# Patient Record
Sex: Female | Born: 1970 | Race: Black or African American | Hispanic: No | Smoking: Never smoker
Health system: Southern US, Community
[De-identification: ages and names within clinical notes are randomized; demographics above are authoritative.]

## PROBLEM LIST (undated history)

## (undated) DIAGNOSIS — J45909 Unspecified asthma, uncomplicated: Secondary | ICD-10-CM

## (undated) DIAGNOSIS — I509 Heart failure, unspecified: Secondary | ICD-10-CM

## (undated) DIAGNOSIS — K219 Gastro-esophageal reflux disease without esophagitis: Secondary | ICD-10-CM

## (undated) DIAGNOSIS — I10 Essential (primary) hypertension: Secondary | ICD-10-CM

## (undated) DIAGNOSIS — M1990 Unspecified osteoarthritis, unspecified site: Secondary | ICD-10-CM

## (undated) DIAGNOSIS — F32A Depression, unspecified: Secondary | ICD-10-CM

## (undated) DIAGNOSIS — G4733 Obstructive sleep apnea (adult) (pediatric): Secondary | ICD-10-CM

## (undated) DIAGNOSIS — K5792 Diverticulitis of intestine, part unspecified, without perforation or abscess without bleeding: Secondary | ICD-10-CM

## (undated) DIAGNOSIS — F329 Major depressive disorder, single episode, unspecified: Secondary | ICD-10-CM

## (undated) DIAGNOSIS — J40 Bronchitis, not specified as acute or chronic: Secondary | ICD-10-CM

## (undated) HISTORY — PX: CHOLECYSTECTOMY: SHX55

## (undated) HISTORY — PX: ABDOMINAL HYSTERECTOMY: SHX81

## (undated) HISTORY — PX: GASTROPLASTY: SHX192

---

## 2014-09-25 ENCOUNTER — Encounter (HOSPITAL_COMMUNITY): Payer: Self-pay | Admitting: Emergency Medicine

## 2014-09-25 ENCOUNTER — Emergency Department (HOSPITAL_COMMUNITY)

## 2014-09-25 ENCOUNTER — Emergency Department (HOSPITAL_COMMUNITY)
Admission: EM | Admit: 2014-09-25 | Discharge: 2014-09-25 | Disposition: A | Discharge status: Home / Self Care | Attending: Emergency Medicine | Admitting: Emergency Medicine

## 2014-09-25 DIAGNOSIS — S63601A Unspecified sprain of right thumb, initial encounter: Secondary | ICD-10-CM | POA: Diagnosis not present

## 2014-09-25 DIAGNOSIS — I10 Essential (primary) hypertension: Secondary | ICD-10-CM | POA: Insufficient documentation

## 2014-09-25 DIAGNOSIS — M1990 Unspecified osteoarthritis, unspecified site: Secondary | ICD-10-CM | POA: Insufficient documentation

## 2014-09-25 DIAGNOSIS — Y9389 Activity, other specified: Secondary | ICD-10-CM | POA: Diagnosis not present

## 2014-09-25 DIAGNOSIS — W1830XA Fall on same level, unspecified, initial encounter: Secondary | ICD-10-CM | POA: Diagnosis not present

## 2014-09-25 DIAGNOSIS — Y929 Unspecified place or not applicable: Secondary | ICD-10-CM | POA: Diagnosis not present

## 2014-09-25 DIAGNOSIS — Z79899 Other long term (current) drug therapy: Secondary | ICD-10-CM | POA: Insufficient documentation

## 2014-09-25 DIAGNOSIS — S6990XA Unspecified injury of unspecified wrist, hand and finger(s), initial encounter: Secondary | ICD-10-CM | POA: Diagnosis present

## 2014-09-25 HISTORY — DX: Essential (primary) hypertension: I10

## 2014-09-25 HISTORY — DX: Unspecified osteoarthritis, unspecified site: M19.90

## 2014-09-25 MED ORDER — IBUPROFEN 800 MG PO TABS
800.0000 mg | ORAL_TABLET | Freq: Once | ORAL | Status: AC
  Administered 2014-09-25: 800 mg via ORAL
  Filled 2014-09-25: qty 1

## 2014-09-25 MED ORDER — HYDROCODONE-ACETAMINOPHEN 5-325 MG PO TABS: 1.0000 | ORAL_TABLET | ORAL | Status: DC | PRN

## 2014-09-25 MED ORDER — IBUPROFEN 600 MG PO TABS: 600.0000 mg | ORAL_TABLET | Freq: Four times a day (QID) | ORAL | Status: DC | PRN

## 2014-09-25 NOTE — Discharge Instructions (Signed)
Thumb Sprain Your exam shows you have a sprained thumb. This means the ligaments around the joint have been torn. Thumb sprains usually take 3-6 weeks to heal. However, severe, unstable sprains may need to be fixed surgically. Sometimes a small piece of bone is pulled off by the ligament. If this is not treated properly, a sprained thumb can lead to a painful, weak joint. Treatment helps reduce pain and shortens the period of disability. The thumb, and often the wrist, must remain splinted for the first 2-4 weeks to protect the joint. Keep your hand elevated and apply ice packs frequently to the injured area (20-30 minutes every 2-3 hours) for the next 2-4 days. This helps reduce swelling and control pain. Pain medicine may also be used for several days. Motion and strengthening exercises may later be prescribed for the joint to return to normal function. Be sure to see your doctor for follow-up because your thumb joint may require further support with splints, bandages or tape. Please see your doctor or go to the emergency room right away if you have increased pain despite proper treatment, or a numb, cold, or pale thumb. Document Released: 01/13/2005 Document Revised: 02/28/2012 Document Reviewed: 12/07/2008 [facility] Patient Information 2015 [place]. This information is not intended to replace advice given to you by your health care provider. Make sure you discuss any questions you have with your health care provider.   Your x-rays are negative for any broken bones, I suspect you have sprained it your thumb as discussed.  Wear the splint for comfort.  Ice and elevation will also help with pain swelling.  Use the medications prescribed.  Do not drive within 4 hours of taking hydrocodone as this will make you drowsy.  Call Dr. Romeo AppleHarrison for further evaluation of your injury, especially if it is not improving with this treatment.  Please note that your blood pressure is elevated this evening.  Make  sure you take your blood pressure medicines when you arrive home.

## 2014-09-25 NOTE — ED Notes (Signed)
Pt fell on Sunday, Right hand pain, swelling, has been trying to deal with it, states it is not getting better.

## 2014-09-25 NOTE — ED Provider Notes (Signed)
CSN: [id number]     Arrival date & time 09/25/14  1827 History   First MD Initiated Contact with Patient 09/25/14 2022     Chief Complaint  Patient presents with  . Hand Injury     (Consider location/radiation/quality/duration/timing/severity/associated sxs/prior Treatment) The history is provided by the patient.   Ann Giles is a 43 y.o. right handed female presenting with persistent pain in her right hand, specifically her right proximal palm since slipping on a wet step 4 days ago, falling with her right hand stretched out behind her, with her from taking the majority of the strain with her fall.  She has persistent pain which radiates into her dorsal forearm.  Her pain is better at rest and worsened with movement and when the extremity is recumbent.  She has taken no medications, no treatment for this injury.     Past Medical History  Diagnosis Date  . Hypertension   . Arthritis    Past Surgical History  Procedure Laterality Date  . Cholecystectomy    . Abdominal hysterectomy     No family history on file. History  Substance Use Topics  . Smoking status: Never Smoker   . Smokeless tobacco: Not on file  . Alcohol Use: No   OB History   Grav Para Term Preterm Abortions TAB SAB Ect Mult Living                 Review of Systems  Constitutional: Negative for fever.  Musculoskeletal: Positive for arthralgias and joint swelling. Negative for myalgias.  Neurological: Negative for weakness and numbness.      Allergies  Review of patient's allergies indicates no known allergies.  Home Medications   Prior to Admission medications   Medication Sig Start Date End Date Taking? Authorizing Provider  diphenhydrAMINE (BENADRYL) 25 MG tablet Take 25-50 mg by mouth at bedtime as needed for allergies or sleep.   Yes Historical Provider, MD  hydrochlorothiazide (HYDRODIURIL) 25 MG tablet Take 25 mg by mouth at bedtime.   Yes Historical Provider, MD  omeprazole  (PRILOSEC) 20 MG capsule Take 20 mg by mouth at bedtime.   Yes Historical Provider, MD  sertraline (ZOLOFT) 25 MG tablet Take 25 mg by mouth at bedtime.   Yes Historical Provider, MD  HYDROcodone-acetaminophen (NORCO/VICODIN) 5-325 MG per tablet Take 1 tablet by mouth every 4 (four) hours as needed. 09/25/14   Burgess Amor, PA-C  ibuprofen (ADVIL,MOTRIN) 600 MG tablet Take 1 tablet (600 mg total) by mouth every 6 (six) hours as needed. 09/25/14   Burgess Amor, PA-C   BP 187/92  Pulse 58  Temp(Src) 98.1 F (36.7 C) (Oral)  Resp 20  Ht 5\' 8"  (1.727 m)  Wt 358 lb 12.8 oz (162.751 kg)  BMI 54.57 kg/m2  SpO2 95% Physical Exam  Constitutional: She appears well-developed and well-nourished.  HENT:  Head: Atraumatic.  Neck: Normal range of motion.  Cardiovascular:  Pulses equal bilaterally  Musculoskeletal: She exhibits tenderness. She exhibits no edema.  Patient is tender to palpation at her right thumb MCP joint.  She is able to flex and extend the thumb but with discomfort.  Resisted flex extension is also uncomfortable.  She has no palpable deformity, there is no crepitus and no pain to help patient over her scaphoid.  Forearm is nontender.  There is no appreciable edema.  Distal Refill is less than 2 seconds.  Full radial pulse present.  Neurological: She is alert. She has normal strength. She displays  normal reflexes. No sensory deficit.  Skin: Skin is warm and dry.  Psychiatric: She has a normal mood and affect.    ED Course  Procedures (including critical care time) Labs Review Labs Reviewed - No data to display  Imaging Review Dg Hand Complete Right  09/25/2014   CLINICAL DATA:  Status post fall on outstretched hand with 09/22/2014 with pain since the accident. Initial encounter.  EXAM: RIGHT HAND - COMPLETE 3+ VIEW  COMPARISON:  None.  FINDINGS: Imaged bones, joints and soft tissues appear normal.  IMPRESSION: Negative exam.   Electronically Signed   By: Drusilla Kannerhomas  Dalessio M.D.   On:  09/25/2014 19:15     EKG Interpretation None      MDM   Final diagnoses:  Sprain, thumb, right, initial encounter  Essential hypertension    Thumb spica splint applied, patient was prescribed ibuprofen and hydrocodone.  Encouraged ice, elevation.  Referral to Dr. Romeo AppleHarrison for further evaluation and treatment as needed.  Blood pressure is elevated at today's visit.  She was encouraged to take her blood pressure medications upon arrival home.  Patients labs and/or radiological studies were viewed and considered during the medical decision making and disposition process.     Burgess AmorJulie Elmira Olkowski, PA-C 09/25/14 2127

## 2014-09-25 NOTE — ED Provider Notes (Signed)
Medical screening examination/treatment/procedure(s) were performed by non-physician practitioner and as supervising physician I was immediately available for consultation/collaboration.   EKG Interpretation None        Benny LennertJoseph L Aviannah Castoro, MD 09/25/14 2317

## 2016-05-18 ENCOUNTER — Emergency Department (HOSPITAL_COMMUNITY)
Admission: EM | Admit: 2016-05-18 | Discharge: 2016-05-18 | Disposition: A | Discharge status: Home / Self Care | Payer: TRICARE For Life (TFL) | Attending: Emergency Medicine | Admitting: Emergency Medicine

## 2016-05-18 ENCOUNTER — Emergency Department (HOSPITAL_COMMUNITY): Payer: TRICARE For Life (TFL)

## 2016-05-18 ENCOUNTER — Encounter (HOSPITAL_COMMUNITY): Payer: Self-pay | Admitting: Emergency Medicine

## 2016-05-18 DIAGNOSIS — M1990 Unspecified osteoarthritis, unspecified site: Secondary | ICD-10-CM | POA: Diagnosis not present

## 2016-05-18 DIAGNOSIS — I10 Essential (primary) hypertension: Secondary | ICD-10-CM | POA: Diagnosis not present

## 2016-05-18 DIAGNOSIS — F329 Major depressive disorder, single episode, unspecified: Secondary | ICD-10-CM | POA: Diagnosis not present

## 2016-05-18 DIAGNOSIS — N200 Calculus of kidney: Secondary | ICD-10-CM | POA: Diagnosis not present

## 2016-05-18 DIAGNOSIS — Z791 Long term (current) use of non-steroidal anti-inflammatories (NSAID): Secondary | ICD-10-CM | POA: Diagnosis not present

## 2016-05-18 DIAGNOSIS — R109 Unspecified abdominal pain: Secondary | ICD-10-CM | POA: Diagnosis present

## 2016-05-18 DIAGNOSIS — Z79899 Other long term (current) drug therapy: Secondary | ICD-10-CM | POA: Insufficient documentation

## 2016-05-18 HISTORY — DX: Depression, unspecified: F32.A

## 2016-05-18 HISTORY — DX: Major depressive disorder, single episode, unspecified: F32.9

## 2016-05-18 LAB — COMPREHENSIVE METABOLIC PANEL
ALK PHOS: 59 U/L (ref 38–126)
ALT: 26 U/L (ref 14–54)
AST: 23 U/L (ref 15–41)
Albumin: 3.9 g/dL (ref 3.5–5.0)
Anion gap: 10 (ref 5–15)
BUN: 14 mg/dL (ref 6–20)
CO2: 32 mmol/L (ref 22–32)
CREATININE: 1.4 mg/dL — AB (ref 0.44–1.00)
Calcium: 9.2 mg/dL (ref 8.9–10.3)
Chloride: 97 mmol/L — ABNORMAL LOW (ref 101–111)
GFR calc Af Amer: 52 mL/min — ABNORMAL LOW (ref >60)
GFR, EST NON AFRICAN AMERICAN: 45 mL/min — AB (ref >60)
Glucose, Bld: 100 mg/dL — ABNORMAL HIGH (ref 65–99)
Potassium: 3.5 mmol/L (ref 3.5–5.1)
Sodium: 139 mmol/L (ref 135–145)
Total Bilirubin: 0.5 mg/dL (ref 0.3–1.2)
Total Protein: 8.4 g/dL — ABNORMAL HIGH (ref 6.5–8.1)

## 2016-05-18 LAB — CBC
HCT: 40.9 % (ref 36.0–46.0)
Hemoglobin: 12.5 g/dL (ref 12.0–15.0)
MCH: 28 pg (ref 26.0–34.0)
MCHC: 30.6 g/dL (ref 30.0–36.0)
MCV: 91.5 fL (ref 78.0–100.0)
PLATELETS: 292 10*3/uL (ref 150–400)
RBC: 4.47 MIL/uL (ref 3.87–5.11)
RDW: 14.9 % (ref 11.5–15.5)
WBC: 8.6 10*3/uL (ref 4.0–10.5)

## 2016-05-18 LAB — URINALYSIS, ROUTINE W REFLEX MICROSCOPIC
Bilirubin Urine: NEGATIVE
GLUCOSE, UA: NEGATIVE mg/dL
Leukocytes, UA: NEGATIVE
Nitrite: NEGATIVE
PROTEIN: 30 mg/dL — AB
Specific Gravity, Urine: 1.01 (ref 1.005–1.030)
pH: 8.5 — ABNORMAL HIGH (ref 5.0–8.0)

## 2016-05-18 LAB — LIPASE, BLOOD: LIPASE: 20 U/L (ref 11–51)

## 2016-05-18 LAB — URINE MICROSCOPIC-ADD ON

## 2016-05-18 MED ORDER — KETOROLAC TROMETHAMINE 60 MG/2ML IM SOLN
60.0000 mg | Freq: Once | INTRAMUSCULAR | Status: AC
  Administered 2016-05-18: 60 mg via INTRAMUSCULAR
  Filled 2016-05-18: qty 2

## 2016-05-18 MED ORDER — OXYCODONE-ACETAMINOPHEN 5-325 MG PO TABS: 1.0000 | ORAL_TABLET | Freq: Three times a day (TID) | ORAL | Status: DC | PRN

## 2016-05-18 MED ORDER — MELOXICAM 7.5 MG PO TABS: 7.5000 mg | ORAL_TABLET | Freq: Every day | ORAL | Status: DC

## 2016-05-18 MED ORDER — TAMSULOSIN HCL 0.4 MG PO CAPS: 0.4000 mg | ORAL_CAPSULE | Freq: Every day | ORAL | Status: DC

## 2016-05-18 MED ORDER — ONDANSETRON 4 MG PO TBDP: ORAL_TABLET | ORAL | Status: DC

## 2016-05-18 MED ORDER — TAMSULOSIN HCL 0.4 MG PO CAPS
0.4000 mg | ORAL_CAPSULE | Freq: Once | ORAL | Status: AC
  Administered 2016-05-18: 0.4 mg via ORAL
  Filled 2016-05-18: qty 1

## 2016-05-18 MED ORDER — OXYCODONE-ACETAMINOPHEN 5-325 MG PO TABS
2.0000 | ORAL_TABLET | Freq: Once | ORAL | Status: AC
  Administered 2016-05-18: 2 via ORAL
  Filled 2016-05-18: qty 2

## 2016-05-18 NOTE — ED Notes (Signed)
Pt reports RT sided flank pain and nausea x 3 weeks. Pt reports no hx of kidney stones. Denies urinary symptoms.

## 2016-05-18 NOTE — ED Provider Notes (Signed)
CSN: [id number]     Arrival date & time 05/18/16  1213 History   First MD Initiated Contact with Patient 05/18/16 1503     Chief Complaint  Patient presents with  . Flank Pain     (Consider location/radiation/quality/duration/timing/severity/associated sxs/prior Treatment) Patient is a 45 y.o. female presenting with flank pain.  Flank Pain This is a new problem. The current episode started more than 1 week ago. The problem occurs daily. Pertinent negatives include no chest pain, no headaches and no shortness of breath. Nothing aggravates the symptoms. Nothing relieves the symptoms.    Past Medical History  Diagnosis Date  . Hypertension   . Arthritis   . Depression    Past Surgical History  Procedure Laterality Date  . Cholecystectomy    . Abdominal hysterectomy    . Gastroplasty     No family history on file. Social History  Substance Use Topics  . Smoking status: Never Smoker   . Smokeless tobacco: None  . Alcohol Use: No   OB History    No data available     Review of Systems  Constitutional: Negative for chills.  Eyes: Negative for pain.  Respiratory: Negative for shortness of breath.   Cardiovascular: Negative for chest pain.  Endocrine: Negative for polydipsia and polyuria.  Genitourinary: Positive for flank pain.  Neurological: Negative for headaches.  All other systems reviewed and are negative.     Allergies  Review of patient's allergies indicates no known allergies.  Home Medications   Prior to Admission medications   Medication Sig Start Date End Date Taking? Authorizing Provider  amLODipine (NORVASC) 10 MG tablet Take 10 mg by mouth daily. 12/01/15  Yes Historical Provider, MD  hydrochlorothiazide (HYDRODIURIL) 25 MG tablet Take 25 mg by mouth daily.    Yes Historical Provider, MD  ibuprofen (ADVIL,MOTRIN) 200 MG tablet Take 600 mg by mouth every 6 (six) hours as needed for mild pain or moderate pain.   Yes Historical Provider, MD  omeprazole  (PRILOSEC) 20 MG capsule Take 20 mg by mouth daily as needed (for acid reflux).    Yes Historical Provider, MD  sertraline (ZOLOFT) 100 MG tablet Take 100 mg by mouth daily. 02/13/16  Yes Historical Provider, MD  meloxicam (MOBIC) 7.5 MG tablet Take 1 tablet (7.5 mg total) by mouth daily. 05/18/16   Marily Memos, MD  ondansetron (ZOFRAN ODT) 4 MG disintegrating tablet  ODT q4 hours prn nausea/vomit 05/18/16   Marily Memos, MD  oxyCODONE-acetaminophen (PERCOCET/ROXICET) 5-325 MG tablet Take 1-2 tablets by mouth every 8 (eight) hours as needed for severe pain. 05/18/16   Marily Memos, MD  tamsulosin (FLOMAX) 0.4 MG CAPS capsule Take 1 capsule (0.4 mg total) by mouth daily. 05/18/16   Barbara Cower Bernd Crom, MD   BP 147/133 mmHg  Pulse 60  Temp(Src) 97.7 F (36.5 C)  Resp 18  Ht  (1.727 m)  Wt 390 lb (176.903 kg)  BMI 59.31 kg/m2  SpO2 97% Physical Exam  Constitutional: She is oriented to person, place, and time. She appears well-developed and well-nourished.  HENT:  Head: Normocephalic and atraumatic.  Neck: Normal range of motion.  Cardiovascular: Normal rate and regular rhythm.   Pulmonary/Chest: No stridor. No respiratory distress.  Abdominal: Soft. She exhibits no distension. There is no tenderness. There is no rebound.  Musculoskeletal: Normal range of motion. She exhibits no edema.  Neurological: She is alert and oriented to person, place, and time.  Skin: Skin is warm and dry.  Nursing  note and vitals reviewed.   ED Course  Procedures (including critical care time) Labs Review Labs Reviewed  COMPREHENSIVE METABOLIC PANEL - Abnormal; Notable for the following:    Chloride 97 (*)    Glucose, Bld 100 (*)    Creatinine, Ser 1.40 (*)    Total Protein 8.4 (*)    GFR calc non Af Amer 45 (*)    GFR calc Af Amer 52 (*)    All other components within normal limits  URINALYSIS, ROUTINE W REFLEX MICROSCOPIC (NOT AT [facility]) - Abnormal; Notable for the following:    pH 8.5 (*)    Hgb urine  dipstick LARGE (*)    Ketones, ur TRACE (*)    Protein, ur 30 (*)    All other components within normal limits  URINE MICROSCOPIC-ADD ON - Abnormal; Notable for the following:    Squamous Epithelial / LPF 6-30 (*)    Bacteria, UA RARE (*)    All other components within normal limits  LIPASE, BLOOD  CBC    Imaging Review Ct Renal Stone Study  05/18/2016  CLINICAL DATA:  Right flank pain. EXAM: CT ABDOMEN AND PELVIS WITHOUT CONTRAST TECHNIQUE: Multidetector CT imaging of the abdomen and pelvis was performed following the standard protocol without IV contrast. COMPARISON:  None. FINDINGS: Scan quality degraded by large patient size as well as machine related artifact. Lower chest:  Lung bases clear Hepatobiliary: Mild liver enlargement likely due to fatty infiltration. No focal liver lesion. Cholecystectomy. Bile ducts nondilated. Pancreas: Negative Spleen: Negative Adrenals/Urinary Tract: 5 mm stone at the right UPJ with mild right hydronephrosis. Small nonobstructing right renal calculi. Small nonobstructing left renal calculi. No renal mass. Urinary bladder empty without abnormality. Stomach/Bowel: Negative for bowel obstruction. Sigmoid diverticulosis. Mild thickening of the sigmoid colon most likely due to chronic diverticular change. Appendix not visualized. Vascular/Lymphatic: Normal aorta and IVC. No aneurysm. Negative for lymphadenopathy. Reproductive: Hysterectomy changes.  No pelvic mass lesion. Other: No free fluid. Musculoskeletal: Mild lumbar degenerative change. Negative for fracture. IMPRESSION: 5 mm right renal calculus at the right UPJ with mild right hydronephrosis. Additional small nonobstructing renal calculi bilaterally. Sigmoid diverticulosis. Electronically Signed   By: Marlan Palauharles  Clark M.D.   On: 05/18/2016 16:33   I have personally reviewed and evaluated these images and lab results as part of my medical decision-making.   EKG Interpretation None      MDM   Final  diagnoses:  Kidney stone   45 year old female with right sided flank pain and nausea for a few weeks. Was treated for urinary tract infection at the beginning of that and initially had improvement is now worsening again. She does not have a gallbladder. No history of kidney stones. No vaginal discharge or bleeding. Consent for possible pyelonephritis versus ureterolithiasis. Less likely appendicitis or other GI pathology. Labs unremarkable we'll get CT scan to evaluate for stone. CT scan with evidence of mild hydronephrosis secondary to 5 mm stone. It is remaining on for 3 weeks and advised that if it didn't improve in about a week that she would need to follow-up with urology. Otherwise is tolerating by mouth just fine and is stable for discharge home with symptomatic treatment.  New Prescriptions: New Prescriptions   MELOXICAM (MOBIC) 7.5 MG TABLET    Take 1 tablet (7.5 mg total) by mouth daily.   ONDANSETRON (ZOFRAN ODT) 4 MG DISINTEGRATING TABLET    4mg  ODT q4 hours prn nausea/vomit   OXYCODONE-ACETAMINOPHEN (PERCOCET/ROXICET) 5-325 MG TABLET  Take 1-2 tablets by mouth every 8 (eight) hours as needed for severe pain.   TAMSULOSIN (FLOMAX) 0.4 MG CAPS CAPSULE    Take 1 capsule (0.4 mg total) by mouth daily.     I have personally and contemperaneously reviewed labs and imaging and used in my decision making as above.   A medical screening exam was performed and I feel the patient has had an appropriate workup for their chief complaint at this time and likelihood of emergent condition existing is low and thus workup can continue on an outpatient basis.. Their vital signs are stable. They have been counseled on decision, discharge, follow up and which symptoms necessitate immediate return to the emergency department.  They verbally stated understanding and agreement with plan and discharged in stable condition.    Marily Memos, MD 05/18/16 [phone]

## 2016-05-18 NOTE — ED Notes (Signed)
EDP made aware of pt's request for pain medication 

## 2016-09-09 ENCOUNTER — Emergency Department (HOSPITAL_COMMUNITY)

## 2016-09-09 ENCOUNTER — Encounter (HOSPITAL_COMMUNITY): Payer: Self-pay

## 2016-09-09 ENCOUNTER — Inpatient Hospital Stay (HOSPITAL_COMMUNITY)
Admission: EM | Admit: 2016-09-09 | Discharge: 2016-09-12 | DRG: 202 | Disposition: A | Discharge status: Home / Self Care | Attending: Internal Medicine | Admitting: Internal Medicine

## 2016-09-09 DIAGNOSIS — J45901 Unspecified asthma with (acute) exacerbation: Secondary | ICD-10-CM

## 2016-09-09 DIAGNOSIS — I10 Essential (primary) hypertension: Secondary | ICD-10-CM | POA: Diagnosis present

## 2016-09-09 DIAGNOSIS — F32A Depression, unspecified: Secondary | ICD-10-CM | POA: Diagnosis present

## 2016-09-09 DIAGNOSIS — J9601 Acute respiratory failure with hypoxia: Secondary | ICD-10-CM | POA: Diagnosis present

## 2016-09-09 DIAGNOSIS — J9801 Acute bronchospasm: Principal | ICD-10-CM | POA: Diagnosis present

## 2016-09-09 DIAGNOSIS — R0602 Shortness of breath: Secondary | ICD-10-CM | POA: Diagnosis present

## 2016-09-09 DIAGNOSIS — F329 Major depressive disorder, single episode, unspecified: Secondary | ICD-10-CM

## 2016-09-09 DIAGNOSIS — G4733 Obstructive sleep apnea (adult) (pediatric): Secondary | ICD-10-CM | POA: Diagnosis not present

## 2016-09-09 DIAGNOSIS — K219 Gastro-esophageal reflux disease without esophagitis: Secondary | ICD-10-CM | POA: Diagnosis present

## 2016-09-09 DIAGNOSIS — Z6841 Body Mass Index (BMI) 40.0 and over, adult: Secondary | ICD-10-CM

## 2016-09-09 DIAGNOSIS — M7989 Other specified soft tissue disorders: Secondary | ICD-10-CM | POA: Diagnosis present

## 2016-09-09 DIAGNOSIS — J45909 Unspecified asthma, uncomplicated: Secondary | ICD-10-CM | POA: Insufficient documentation

## 2016-09-09 HISTORY — DX: Unspecified asthma, uncomplicated: J45.909

## 2016-09-09 HISTORY — DX: Bronchitis, not specified as acute or chronic: J40

## 2016-09-09 LAB — CBC WITH DIFFERENTIAL/PLATELET
Basophils Absolute: 0 10*3/uL (ref 0.0–0.1)
Basophils Relative: 0 %
EOS PCT: 1 %
Eosinophils Absolute: 0.1 10*3/uL (ref 0.0–0.7)
HEMATOCRIT: 38.4 % (ref 36.0–46.0)
Hemoglobin: 11.4 g/dL — ABNORMAL LOW (ref 12.0–15.0)
LYMPHS PCT: 26 %
Lymphs Abs: 1.7 10*3/uL (ref 0.7–4.0)
MCH: 28.4 pg (ref 26.0–34.0)
MCHC: 29.7 g/dL — ABNORMAL LOW (ref 30.0–36.0)
MCV: 95.8 fL (ref 78.0–100.0)
MONO ABS: 0.6 10*3/uL (ref 0.1–1.0)
Monocytes Relative: 8 %
Neutro Abs: 4.4 10*3/uL (ref 1.7–7.7)
Neutrophils Relative %: 65 %
Platelets: 277 10*3/uL (ref 150–400)
RBC: 4.01 MIL/uL (ref 3.87–5.11)
RDW: 15.6 % — AB (ref 11.5–15.5)
WBC: 6.8 10*3/uL (ref 4.0–10.5)

## 2016-09-09 LAB — COMPREHENSIVE METABOLIC PANEL
ALBUMIN: 3.6 g/dL (ref 3.5–5.0)
ALT: 16 U/L (ref 14–54)
AST: 12 U/L — AB (ref 15–41)
Alkaline Phosphatase: 61 U/L (ref 38–126)
Anion gap: 7 (ref 5–15)
BUN: 16 mg/dL (ref 6–20)
CHLORIDE: 99 mmol/L — AB (ref 101–111)
CO2: 35 mmol/L — ABNORMAL HIGH (ref 22–32)
CREATININE: 0.99 mg/dL (ref 0.44–1.00)
Calcium: 8.7 mg/dL — ABNORMAL LOW (ref 8.9–10.3)
GFR calc Af Amer: >60 mL/min (ref >60)
GLUCOSE: 106 mg/dL — AB (ref 65–99)
POTASSIUM: 3.7 mmol/L (ref 3.5–5.1)
Sodium: 141 mmol/L (ref 135–145)
Total Bilirubin: 0.7 mg/dL (ref 0.3–1.2)
Total Protein: 8.1 g/dL (ref 6.5–8.1)

## 2016-09-09 LAB — I-STAT BETA HCG BLOOD, ED (MC, WL, AP ONLY)

## 2016-09-09 LAB — TROPONIN I: Troponin I: <0.03 ng/mL (ref <0.03)

## 2016-09-09 LAB — BRAIN NATRIURETIC PEPTIDE: B Natriuretic Peptide: 71 pg/mL (ref 0.0–100.0)

## 2016-09-09 LAB — PROCALCITONIN: PROCALCITONIN: 0.1 ng/mL

## 2016-09-09 LAB — D-DIMER, QUANTITATIVE (NOT AT ARMC): D DIMER QUANT: 1.4 ug{FEU}/mL — AB (ref 0.00–0.50)

## 2016-09-09 MED ORDER — POLYETHYLENE GLYCOL 3350 17 G PO PACK: 17.0000 g | PACK | Freq: Every day | ORAL | Status: DC | PRN

## 2016-09-09 MED ORDER — SODIUM CHLORIDE 0.9 % IV SOLN: 250.0000 mL | INTRAVENOUS | Status: DC | PRN

## 2016-09-09 MED ORDER — AMLODIPINE BESYLATE 5 MG PO TABS
10.0000 mg | ORAL_TABLET | Freq: Every day | ORAL | Status: DC
  Administered 2016-09-09 – 2016-09-12 (×4): 10 mg via ORAL
  Filled 2016-09-09 (×4): qty 2

## 2016-09-09 MED ORDER — METHYLPREDNISOLONE SODIUM SUCC 125 MG IJ SOLR
60.0000 mg | Freq: Three times a day (TID) | INTRAMUSCULAR | Status: DC
  Administered 2016-09-09 – 2016-09-12 (×7): 60 mg via INTRAVENOUS
  Filled 2016-09-09 (×8): qty 2

## 2016-09-09 MED ORDER — SODIUM CHLORIDE 0.9% FLUSH
3.0000 mL | Freq: Two times a day (BID) | INTRAVENOUS | Status: DC
  Administered 2016-09-09 – 2016-09-11 (×3): 3 mL via INTRAVENOUS

## 2016-09-09 MED ORDER — HYDROCHLOROTHIAZIDE 25 MG PO TABS
25.0000 mg | ORAL_TABLET | Freq: Every day | ORAL | Status: DC
  Administered 2016-09-09 – 2016-09-12 (×4): 25 mg via ORAL
  Filled 2016-09-09 (×4): qty 1

## 2016-09-09 MED ORDER — SERTRALINE HCL 50 MG PO TABS: 100.0000 mg | ORAL_TABLET | Freq: Every day | ORAL | Status: DC

## 2016-09-09 MED ORDER — SODIUM CHLORIDE 0.9 % IV SOLN
Freq: Once | INTRAVENOUS | Status: AC
  Administered 2016-09-09: 17:00:00 via INTRAVENOUS

## 2016-09-09 MED ORDER — ACETAMINOPHEN 325 MG PO TABS: 650.0000 mg | ORAL_TABLET | Freq: Four times a day (QID) | ORAL | Status: DC | PRN

## 2016-09-09 MED ORDER — TECHNETIUM TC 99M DIETHYLENETRIAME-PENTAACETIC ACID
30.0000 | Freq: Once | INTRAVENOUS | Status: AC | PRN
  Administered 2016-09-09: 33 via RESPIRATORY_TRACT

## 2016-09-09 MED ORDER — TECHNETIUM TO 99M ALBUMIN AGGREGATED
4.0000 | Freq: Once | INTRAVENOUS | Status: AC | PRN
  Administered 2016-09-09: 4.2 via INTRAVENOUS

## 2016-09-09 MED ORDER — HYDROCHLOROTHIAZIDE 25 MG PO TABS: 25.0000 mg | ORAL_TABLET | Freq: Every day | ORAL | Status: DC

## 2016-09-09 MED ORDER — METHYLPREDNISOLONE SODIUM SUCC 125 MG IJ SOLR
125.0000 mg | Freq: Once | INTRAMUSCULAR | Status: AC
  Administered 2016-09-09: 125 mg via INTRAVENOUS
  Filled 2016-09-09: qty 2

## 2016-09-09 MED ORDER — MAGNESIUM SULFATE 2 GM/50ML IV SOLN
2.0000 g | Freq: Once | INTRAVENOUS | Status: AC
  Administered 2016-09-09: 2 g via INTRAVENOUS
  Filled 2016-09-09: qty 50

## 2016-09-09 MED ORDER — IPRATROPIUM BROMIDE 0.02 % IN SOLN
1.0000 mg | Freq: Once | RESPIRATORY_TRACT | Status: AC
  Administered 2016-09-09: 1 mg via RESPIRATORY_TRACT
  Filled 2016-09-09: qty 5

## 2016-09-09 MED ORDER — PANTOPRAZOLE SODIUM 40 MG PO TBEC: 40.0000 mg | DELAYED_RELEASE_TABLET | Freq: Every day | ORAL | Status: DC

## 2016-09-09 MED ORDER — DEXTROSE 5 % IV SOLN
INTRAVENOUS | Status: AC
  Filled 2016-09-09: qty 500

## 2016-09-09 MED ORDER — IPRATROPIUM-ALBUTEROL 0.5-2.5 (3) MG/3ML IN SOLN
3.0000 mL | Freq: Three times a day (TID) | RESPIRATORY_TRACT | Status: DC
  Administered 2016-09-10 – 2016-09-12 (×7): 3 mL via RESPIRATORY_TRACT
  Filled 2016-09-09 (×7): qty 3

## 2016-09-09 MED ORDER — ACETAMINOPHEN 650 MG RE SUPP: 650.0000 mg | Freq: Four times a day (QID) | RECTAL | Status: DC | PRN

## 2016-09-09 MED ORDER — HYDROCODONE-ACETAMINOPHEN 5-325 MG PO TABS
1.0000 | ORAL_TABLET | ORAL | Status: DC | PRN
  Filled 2016-09-09: qty 2

## 2016-09-09 MED ORDER — ENOXAPARIN SODIUM 100 MG/ML ~~LOC~~ SOLN
90.0000 mg | SUBCUTANEOUS | Status: DC
  Administered 2016-09-09 – 2016-09-11 (×3): 90 mg via SUBCUTANEOUS
  Filled 2016-09-09 (×3): qty 1

## 2016-09-09 MED ORDER — ALBUTEROL SULFATE (2.5 MG/3ML) 0.083% IN NEBU
5.0000 mg | INHALATION_SOLUTION | Freq: Once | RESPIRATORY_TRACT | Status: AC
  Administered 2016-09-09: 5 mg via RESPIRATORY_TRACT
  Filled 2016-09-09: qty 6

## 2016-09-09 MED ORDER — BISACODYL 5 MG PO TBEC: 5.0000 mg | DELAYED_RELEASE_TABLET | Freq: Every day | ORAL | Status: DC | PRN

## 2016-09-09 MED ORDER — SODIUM CHLORIDE 0.9% FLUSH: 3.0000 mL | INTRAVENOUS | Status: DC | PRN

## 2016-09-09 MED ORDER — IPRATROPIUM-ALBUTEROL 0.5-2.5 (3) MG/3ML IN SOLN
3.0000 mL | RESPIRATORY_TRACT | Status: DC | PRN
  Administered 2016-09-09 – 2016-09-10 (×2): 3 mL via RESPIRATORY_TRACT
  Filled 2016-09-09 (×2): qty 3

## 2016-09-09 MED ORDER — ALBUTEROL (5 MG/ML) CONTINUOUS INHALATION SOLN
10.0000 mg/h | INHALATION_SOLUTION | Freq: Once | RESPIRATORY_TRACT | Status: AC
  Administered 2016-09-09: 10 mg/h via RESPIRATORY_TRACT

## 2016-09-09 MED ORDER — AMLODIPINE BESYLATE 5 MG PO TABS: 10.0000 mg | ORAL_TABLET | Freq: Every day | ORAL | Status: DC

## 2016-09-09 MED ORDER — PANTOPRAZOLE SODIUM 40 MG PO TBEC
40.0000 mg | DELAYED_RELEASE_TABLET | Freq: Every day | ORAL | Status: DC
  Administered 2016-09-09 – 2016-09-12 (×4): 40 mg via ORAL
  Filled 2016-09-09 (×4): qty 1

## 2016-09-09 MED ORDER — ONDANSETRON HCL 4 MG/2ML IJ SOLN: 4.0000 mg | Freq: Four times a day (QID) | INTRAMUSCULAR | Status: DC | PRN

## 2016-09-09 MED ORDER — ALBUTEROL (5 MG/ML) CONTINUOUS INHALATION SOLN
10.0000 mg/h | INHALATION_SOLUTION | Freq: Once | RESPIRATORY_TRACT | Status: AC
  Administered 2016-09-09: 10 mg/h via RESPIRATORY_TRACT
  Filled 2016-09-09: qty 20

## 2016-09-09 MED ORDER — IOPAMIDOL (ISOVUE-370) INJECTION 76%: 125.0000 mL | Freq: Once | INTRAVENOUS | Status: DC | PRN

## 2016-09-09 MED ORDER — SERTRALINE HCL 50 MG PO TABS
100.0000 mg | ORAL_TABLET | Freq: Every day | ORAL | Status: DC
  Administered 2016-09-09 – 2016-09-12 (×4): 100 mg via ORAL
  Filled 2016-09-09 (×4): qty 2

## 2016-09-09 MED ORDER — ONDANSETRON HCL 4 MG PO TABS: 4.0000 mg | ORAL_TABLET | Freq: Four times a day (QID) | ORAL | Status: DC | PRN

## 2016-09-09 MED ORDER — SODIUM CHLORIDE 0.9% FLUSH
3.0000 mL | Freq: Two times a day (BID) | INTRAVENOUS | Status: DC
  Administered 2016-09-09 – 2016-09-12 (×3): 3 mL via INTRAVENOUS

## 2016-09-09 MED ORDER — DEXTROSE 5 % IV SOLN
500.0000 mg | INTRAVENOUS | Status: DC
  Administered 2016-09-09 – 2016-09-11 (×3): 500 mg via INTRAVENOUS
  Filled 2016-09-09 (×4): qty 500

## 2016-09-09 NOTE — ED Notes (Signed)
Nuc MEd aware of study

## 2016-09-09 NOTE — ED Triage Notes (Signed)
Pt reports cough and congestion for past few days.  Reports sob today.  Has used inhaler and otc cough medication without relief.  Reports chest pain.

## 2016-09-09 NOTE — ED Notes (Signed)
Swelling noted to ble, pt states is a lot better than ususall. Denies gu sx. Congested cough noted. No cyanosis present.cap refill immediate Pt taken to xray

## 2016-09-09 NOTE — ED Notes (Signed)
floo runable to take report at this time

## 2016-09-09 NOTE — ED Notes (Signed)
Pt taken off tx to perform NM perf test. Nad.

## 2016-09-09 NOTE — H&P (Signed)
History and Physical    Gearline L Giles JXB:[id number] DOB: 1971/12/13 DOA: 09/09/2016  PCP: No PCP Per Patient   Patient coming from: Home   Chief Complaint: SOB, cough, wheeze   HPI: Ann Giles is a 45 y.o. female with medical history significant for depression, hypertension, GERD, and recurrent episodes of acute bronchospasm responsive to bronchodilators who presents the emergency department with severe dyspnea and cough. Patient reports that she been in her usual state of health until 09/03/2016 when she developed some rhinorrhea, sinus congestion, and sore throat. Many of her coworkers had been suffering from similar symptoms and as they were all recovering in the last couple days, she was continuing to worsen. The rhinorrhea and sinus congestion has improved, as did the sore throat, but she has become increasingly dyspneic and has had cough and audible wheezing. She is coughing up thick dark sputum. She denies chest pain or palpitations and denies lightheadedness. She reports improvement in her chronic bilateral lower extremity edema and denies orthopnea. She reports similar episodes multiple times previously, but reports that they were not as severe. She had been diagnosed with asthma in the emergency department and had been given an albuterol inhaler which she had been using successfully for occasional mild symptoms. She reports being evaluated by pulmonology in the past for her obstructive sleep apnea, but has never been evaluated for her bronchospastic disease.  ED Course: Upon arrival to the ED, patient is found to be afebrile, saturating 76% on room air, hypertensive to 155/103, and with normal heart rate. EKG demonstrates sinus rhythm with T-wave flattening in lateral leads. Troponin is undetectable and BNP is within the normal limits at 71. Chest x-ray is negative for acute cardiopulmonary disease, CMP features a bicarbonate of 35, and CBC is notable only for a normocytic  anemia with hemoglobin 11.4. O2 saturations improved with the administration of 4 L/m supplemental oxygen via nasal cannula. Patient was treated with multiple neb treatments, IV magnesium, and 125 mg IV Solu-Medrol. Given the magnitude of her hypoxia, PE was considered and d-dimer was sent. D-dimer turns elevated to a value 1.40 and VQ scan was performed and is a low probability study for PE. Patient enjoyed some subjective improvement in her symptoms with the treatments administered in the ED. She has remained hemodynamically stable and is oxygenating well with 4 L/m via nasal cannula. 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  Review of Systems:  All other systems reviewed and apart from HPI, are negative.  Past Medical History:  Diagnosis Date  . Arthritis   . Asthma   . Bronchitis   . Depression   . Hypertension     Past Surgical History:  Procedure Laterality Date  . ABDOMINAL HYSTERECTOMY    . CHOLECYSTECTOMY    . GASTROPLASTY       reports that she has never smoked. She has never used smokeless tobacco. She reports that she does not drink alcohol or use drugs.  No Known Allergies  History reviewed. No pertinent family history.   Prior to Admission medications   Medication Sig Start Date End Date Taking? Authorizing Provider  amLODipine (NORVASC) 10 MG tablet Take 10 mg by mouth daily. 12/01/15  Yes Historical Provider, MD  hydrochlorothiazide (HYDRODIURIL) 25 MG tablet Take 25 mg by mouth daily.    Yes Historical Provider, MD  ibuprofen (ADVIL,MOTRIN) 200 MG tablet Take 600 mg by mouth every 6 (  six) hours as needed for mild pain or moderate pain.   Yes Historical Provider, MD  omeprazole (PRILOSEC) 20 MG capsule Take 20 mg by mouth daily as needed (for acid reflux).    Yes Historical Provider, MD  sertraline (ZOLOFT) 100 MG tablet Take 100 mg by mouth daily. 02/13/16  Yes  Historical Provider, MD  meloxicam (MOBIC) 7.5 MG tablet Take 1 tablet (7.5 mg total) by mouth daily. Patient not taking: Reported on 09/09/2016 05/18/16   Marily Memos, MD  ondansetron (ZOFRAN ODT) 4 MG disintegrating tablet 4mg  ODT q4 hours prn nausea/vomit Patient not taking: Reported on 09/09/2016 05/18/16   Marily Memos, MD  oxyCODONE-acetaminophen (PERCOCET/ROXICET) 5-325 MG tablet Take 1-2 tablets by mouth every 8 (eight) hours as needed for severe pain. Patient not taking: Reported on 09/09/2016 05/18/16   Marily Memos, MD  tamsulosin (FLOMAX) 0.4 MG CAPS capsule Take 1 capsule (0.4 mg total) by mouth daily. Patient not taking: Reported on 09/09/2016 05/18/16   Marily Memos, MD    Physical Exam: Vitals:   09/09/16 1645 09/09/16 1700 09/09/16 1715 09/09/16 1730  BP:  155/92  153/72  Pulse: 68 70 71 80  Resp:      Temp:      TempSrc:      SpO2: 94% 94% 95% 94%  Weight:      Height:          Constitutional: In moderate respiratory distress with accessory muscle use and dyspnea between 3-4 words, obese Eyes: PERTLA, lids and conjunctivae normal ENMT: Mucous membranes are moist. Posterior pharynx clear of any exudate or lesions.   Neck: normal, supple, no masses, no thyromegaly Respiratory: Wheezing bilaterally, coarse rhonchi at left mid-lung and right apex. Normal respiratory effort.  Cardiovascular: S1 & S2 heard, regular rate and rhythm. No carotid bruits. No significant JVD. Abdomen: No distension, no tenderness, no masses palpated. Bowel sounds normal.  Musculoskeletal: no clubbing / cyanosis. No joint deformity upper and lower extremities. Normal muscle tone.  Skin: no significant rashes, lesions, ulcers. Warm, dry, well-perfused. Neurologic: CN 2-12 grossly intact. Sensation intact, DTR normal. Strength 5/5 in all 4 limbs.  Psychiatric: Normal judgment and insight. Alert and oriented x 3. Normal mood and affect.     Labs on Admission: I have personally reviewed following  labs and imaging studies  CBC:  Recent Labs Lab 09/09/16 0950  WBC 6.8  NEUTROABS 4.4  HGB 11.4*  HCT 38.4  MCV 95.8  PLT 277   Basic Metabolic Panel:  Recent Labs Lab 09/09/16 1000  NA 141  K 3.7  CL 99*  CO2 35*  GLUCOSE 106*  BUN 16  CREATININE 0.99  CALCIUM 8.7*   GFR: Estimated Creatinine Clearance: 120.9 mL/min (by C-G formula based on SCr of 0.99 mg/dL). Liver Function Tests:  Recent Labs Lab 09/09/16 1000  AST 12*  ALT 16  ALKPHOS 61  BILITOT 0.7  PROT 8.1  ALBUMIN 3.6   No results for input(s): LIPASE, AMYLASE in the last 168 hours. No results for input(s): AMMONIA in the last 168 hours. Coagulation Profile: No results for input(s): INR, PROTIME in the last 168 hours. Cardiac Enzymes:  Recent Labs Lab 09/09/16 1000  TROPONINI <0.03   BNP (last 3 results) No results for input(s): PROBNP in the last 8760 hours. HbA1C: No results for input(s): HGBA1C in the last 72 hours. CBG: No results for input(s): GLUCAP in the last 168 hours. Lipid Profile: No results for input(s): CHOL, HDL, LDLCALC, TRIG, CHOLHDL, LDLDIRECT  in the last 72 hours. Thyroid Function Tests: No results for input(s): TSH, T4TOTAL, FREET4, T3FREE, THYROIDAB in the last 72 hours. Anemia Panel: No results for input(s): VITAMINB12, FOLATE, FERRITIN, TIBC, IRON, RETICCTPCT in the last 72 hours. Urine analysis:    Component Value Date/Time   COLORURINE YELLOW 05/18/2016 1308   APPEARANCEUR CLEAR 05/18/2016 1308   LABSPEC 1.010 05/18/2016 1308   PHURINE 8.5 (H) 05/18/2016 1308   GLUCOSEU NEGATIVE 05/18/2016 1308   HGBUR LARGE (A) 05/18/2016 1308   BILIRUBINUR NEGATIVE 05/18/2016 1308   KETONESUR TRACE (A) 05/18/2016 1308   PROTEINUR 30 (A) 05/18/2016 1308   NITRITE NEGATIVE 05/18/2016 1308   LEUKOCYTESUR NEGATIVE 05/18/2016 1308   Sepsis Labs: @LABRCNTIP (procalcitonin:4,lacticidven:4) )No results found for this or any previous visit (from the past 240 hour(s)).    Radiological Exams on Admission: Dg Chest 2 View  Result Date: 09/09/2016 CLINICAL DATA:  Pt reports cough and congestion for past few days. Reports sob today. chest pain. EXAM: CHEST  2 VIEW COMPARISON:  None. FINDINGS: Study quality is degraded by patient body habitus. No definite consolidations or pleural effusions. No pulmonary edema. IMPRESSION: No evidence for acute abnormality. Study compromised by patient body habitus. Electronically Signed   By: Norva PavlovElizabeth  Brown M.D.   On: 09/09/2016 10:23   Nm Pulmonary Perf And Vent  Result Date: 09/09/2016 CLINICAL DATA:  45 y/o F; 1 week of shortness of breath with some chest pain and tightness. EXAM: NUCLEAR MEDICINE VENTILATION - PERFUSION LUNG SCAN TECHNIQUE: Ventilation images were obtained in multiple projections using inhaled aerosol Tc-7051m DTPA. Perfusion images were obtained in multiple projections after intravenous injection of Tc-6951m MAA. RADIOPHARMACEUTICALS:  33 mCi Technetium-3651m DTPA aerosol inhalation and 4.2 mCi Technetium-5951m MAA IV COMPARISON:  09/09/2016 chest radiograph. FINDINGS: Ventilation: Poor ventilation radiotracer accumulation. Gastric uptake probably due to free technetium. Heterogeneous ventilation in the left upper lobe. Perfusion: Probable small perfusion defect in the left upper lobe. Otherwise no wedge shaped peripheral perfusion defects to suggest acute pulmonary embolism. IMPRESSION: Small left upper perfusion defect, probably matched, poor ventilation phase radiotracer accumulation. Low probability of pulmonary embolus. Electronically Signed   By: Mitzi HansenLance  Furusawa-Stratton M.D.   On: 09/09/2016 15:40    EKG: Independently reviewed. Sinus rhythm, T-wave flattening/inversion in lateral leads  Assessment/Plan  1. Acute hypoxic respiratory failure with bronchospasm  - Saturating only 76% on rm air upon arrival, with sat normalizing on 4 Lpm via [place]  - CXR clear, scattered rhonchi and wheezes on exam; no fever or  leukocytosis; mentating well  - Diagnosed with asthma previously in ED as she is a never-smoker who responded to albuterol, but never evaluated with PFT's  - Treated with neb, mag, and 125 mg IV Solu-Medrol in ED  - Continue prn neb treatments; continue steroid with Solu-Medrol 60 mg IV q8h  - Azithromycin added for atypical coverage given the rhonchi on exam; current illness was preceded by acute URI and there is some concern for a secondary PNA  - Wean supplemental O2 and steroid as tolerated    2. Hypertension  - Mildly elevated in ED, likely secondary to pt not taking medications today  - Continue Norvasc and HCTZ   3. Depression - Stable, not currently depressed  - Continue Zoloft  4. GERD - Stable, no EGD on file  - Continue PPI therapy   5. OSA  - Continue BiPAP qHS    6. Obesity, BMI 57 - Counseled; pt motivated to lose wt    DVT prophylaxis:  sq Lovenox  Code Status: Full  Family Communication: Discussed with patient Disposition Plan: Observe on telemetry Consults called: None Admission status: Observation   Briscoe Deutscher, MD [facility] Pager [phone]  If 7PM-7AM, please contact night-coverage [email or web address] Password [email or web address]  09/09/2016, 6:15 PM

## 2016-09-09 NOTE — ED Notes (Signed)
Rt called for treatment.

## 2016-09-09 NOTE — ED Provider Notes (Signed)
[facility]-EMERGENCY DEPT Provider Note   CSN: [id number] Arrival date & time: 09/09/16  0941  By signing my name below, I, Sandrea Hammond, attest that this documentation has been prepared under the direction and in the presence of Shaune Pollack, MD. Electronically Signed: Sandrea Hammond, ED Scribe. 09/09/16. 10:16 AM.  History   Chief Complaint Chief Complaint  Patient presents with  . Shortness of Breath    HPI Comments: Ann Giles is a 45 y.o. female  with past medical history of asthma who presents to the Emergency Department complaining of cough and congestion since 3 days PTA. Pt oxygen sats were at 76% on room air with improvement in the ED to 97% after receiving 4L oxygen via nasal canula. Pt usually uses biPAP but states it was not helping last night. Pt also mentions associated mild chest heaviness along with subjective fever and diaphoresis. Pt takes albuterol inhaler for her asthma and denies recent nebulizer treatments. She reports sick contacts at work.  The history is provided by the patient and a parent. No language interpreter was used.    Past Medical History:  Diagnosis Date  . Arthritis   . Asthma   . Bronchitis   . Depression   . Hypertension     Patient Active Problem List   Diagnosis Date Noted  . Acute bronchospasm 09/09/2016  . Asthma 09/09/2016  . Hypertension 09/09/2016  . Depression 09/09/2016  . Leg swelling 09/09/2016  . Acute respiratory failure with hypoxia (HCC) 09/09/2016  . Bronchospasm, acute 09/09/2016    Past Surgical History:  Procedure Laterality Date  . ABDOMINAL HYSTERECTOMY    . CHOLECYSTECTOMY    . GASTROPLASTY      OB History    No data available       Home Medications    Prior to Admission medications   Medication Sig Start Date End Date Taking? Authorizing Provider  amLODipine (NORVASC) 10 MG tablet Take 10 mg by mouth daily. 12/01/15  Yes Historical Provider, MD  hydrochlorothiazide (HYDRODIURIL) 25 MG  tablet Take 25 mg by mouth daily.    Yes Historical Provider, MD  ibuprofen (ADVIL,MOTRIN) 200 MG tablet Take 600 mg by mouth every 6 (six) hours as needed for mild pain or moderate pain.   Yes Historical Provider, MD  omeprazole (PRILOSEC) 20 MG capsule Take 20 mg by mouth daily as needed (for acid reflux).    Yes Historical Provider, MD  sertraline (ZOLOFT) 100 MG tablet Take 100 mg by mouth daily. 02/13/16  Yes Historical Provider, MD  meloxicam (MOBIC) 7.5 MG tablet Take 1 tablet (7.5 mg total) by mouth daily. Patient not taking: Reported on 09/09/2016 05/18/16   Marily Memos, MD  ondansetron (ZOFRAN ODT) 4 MG disintegrating tablet 4mg  ODT q4 hours prn nausea/vomit Patient not taking: Reported on 09/09/2016 05/18/16   Marily Memos, MD  oxyCODONE-acetaminophen (PERCOCET/ROXICET) 5-325 MG tablet Take 1-2 tablets by mouth every 8 (eight) hours as needed for severe pain. Patient not taking: Reported on 09/09/2016 05/18/16   Marily Memos, MD  tamsulosin (FLOMAX) 0.4 MG CAPS capsule Take 1 capsule (0.4 mg total) by mouth daily. Patient not taking: Reported on 09/09/2016 05/18/16   Marily Memos, MD    Family History Family History  Problem Relation Age of Onset  . Asthma Neg Hx     Social History Social History  Substance Use Topics  . Smoking status: Never Smoker  . Smokeless tobacco: Never Used  . Alcohol use No     Allergies  Review of patient's allergies indicates no known allergies.   Review of Systems Review of Systems  Constitutional: Positive for diaphoresis, fatigue and fever. Negative for chills.  HENT: Positive for congestion. Negative for rhinorrhea and sore throat.   Eyes: Negative for visual disturbance.  Respiratory: Positive for cough. Negative for shortness of breath and wheezing.   Cardiovascular: Positive for chest pain. Negative for leg swelling.  Gastrointestinal: Negative for abdominal pain, diarrhea, nausea and vomiting.  Genitourinary: Negative for dysuria, flank  pain, vaginal bleeding and vaginal discharge.  Musculoskeletal: Negative for neck pain.  Skin: Negative for rash.  Allergic/Immunologic: Negative for immunocompromised state.  Neurological: Negative for syncope and headaches.  Hematological: Does not bruise/bleed easily.  All other systems reviewed and are negative.    Physical Exam Updated Vital Signs BP (!) 164/97 (BP Location: Left Arm)   Pulse 62   Temp 97.8 F (36.6 C) (Oral)   Resp 19   Ht 5\' 8"  (1.727 m)   Wt (!) 377 lb (171 kg)   SpO2 95%   BMI 57.32 kg/m   Physical Exam  Constitutional: She is oriented to person, place, and time. She appears well-developed and well-nourished. She appears distressed.  HENT:  Head: Normocephalic and atraumatic.  Eyes: Conjunctivae are normal.  Neck: Neck supple.  Cardiovascular: Normal rate, regular rhythm and normal heart sounds.  Exam reveals no friction rub.   No murmur heard. Pulmonary/Chest: Accessory muscle usage present. Tachypnea noted. She is in respiratory distress. She has no decreased breath sounds. She has wheezes in the right upper field, the right middle field, the right lower field, the left upper field, the left middle field and the left lower field. She has no rhonchi. She has no rales.  Abdominal: She exhibits no distension.  Musculoskeletal: She exhibits no edema.  Neurological: She is alert and oriented to person, place, and time. She exhibits normal muscle tone.  Skin: Skin is warm. Capillary refill takes less than 2 seconds.  Psychiatric: She has a normal mood and affect.  Nursing note and vitals reviewed.    ED Treatments / Results   DIAGNOSTIC STUDIES: Oxygen Saturation is 96% on 4L oxygen via [place], adequate by my interpretation.    COORDINATION OF CARE: 9:55 AM Discussed treatment plan with pt at bedside and pt agreed to plan.   Labs (all labs ordered are listed, but only abnormal results are displayed) Labs Reviewed  CBC WITH DIFFERENTIAL/PLATELET -  Abnormal; Notable for the following:       Result Value   Hemoglobin 11.4 (*)    MCHC 29.7 (*)    RDW 15.6 (*)    All other components within normal limits  COMPREHENSIVE METABOLIC PANEL - Abnormal; Notable for the following:    Chloride 99 (*)    CO2 35 (*)    Glucose, Bld 106 (*)    Calcium 8.7 (*)    AST 12 (*)    All other components within normal limits  D-DIMER, QUANTITATIVE (NOT AT [facility]) - Abnormal; Notable for the following:    D-Dimer, Quant 1.40 (*)    All other components within normal limits  CULTURE, EXPECTORATED SPUTUM-ASSESSMENT  BRAIN NATRIURETIC PEPTIDE  TROPONIN I  PROCALCITONIN  BASIC METABOLIC PANEL  I-STAT BETA HCG BLOOD, ED ([facility], [facility], [facility] ONLY)    EKG  EKG Interpretation  Date/Time:  Thursday September 09 2016 09:49:50 EDT Ventricular Rate:  81 PR Interval:    QRS Duration: 94 QT Interval:  411 QTC Calculation: 478 R  Axis:   68 Text Interpretation:  Sinus rhythm Nonspecific T abnormalities, anterior leads No old tracing to compare Confirmed by [facility]aling[facility] CenterSAACS MD, Daviel Allegretto [phone]([id number]) on 09/09/2016 10:15:05 AM       Radiology Dg Chest 2 View  Result Date: 09/09/2016 CLINICAL DATA:  Pt reports cough and congestion for past few days. Reports sob today. chest pain. EXAM: CHEST  2 VIEW COMPARISON:  None. FINDINGS: Study quality is degraded by patient body habitus. No definite consolidations or pleural effusions. No pulmonary edema. IMPRESSION: No evidence for acute abnormality. Study compromised by patient body habitus. Electronically Signed   By: Norva PavlovElizabeth  Brown M.D.   On: 09/09/2016 10:23   Nm Pulmonary Perf And Vent  Result Date: 09/09/2016 CLINICAL DATA:  45 y/o F; 1 week of shortness of breath with some chest pain and tightness. EXAM: NUCLEAR MEDICINE VENTILATION - PERFUSION LUNG SCAN TECHNIQUE: Ventilation images were obtained in multiple projections using inhaled aerosol Tc-848m DTPA. Perfusion images were obtained in multiple projections after intravenous  injection of Tc-3648m MAA. RADIOPHARMACEUTICALS:  33 mCi Technetium-3748m DTPA aerosol inhalation and 4.2 mCi Technetium-5948m MAA IV COMPARISON:  09/09/2016 chest radiograph. FINDINGS: Ventilation: Poor ventilation radiotracer accumulation. Gastric uptake probably due to free technetium. Heterogeneous ventilation in the left upper lobe. Perfusion: Probable small perfusion defect in the left upper lobe. Otherwise no wedge shaped peripheral perfusion defects to suggest acute pulmonary embolism. IMPRESSION: Small left upper perfusion defect, probably matched, poor ventilation phase radiotracer accumulation. Low probability of pulmonary embolus. Electronically Signed   By: Mitzi HansenLance  Furusawa-Stratton M.D.   On: 09/09/2016 15:40    Procedures .Critical Care Performed by: Shaune PollackISAACS, Bhavya Grand Authorized by: Shaune PollackISAACS, Nakeysha Pasqual   Critical care provider statement:    Critical care time (minutes):  35   Critical care time was exclusive of:  Separately billable procedures and treating other patients   Critical care was necessary to treat or prevent imminent or life-threatening deterioration of the following conditions:  Respiratory failure   Critical care was time spent personally by me on the following activities:  Blood draw for specimens, development of treatment plan with patient or surrogate, discussions with consultants, evaluation of patient's response to treatment, examination of patient, obtaining history from patient or surrogate, ordering and performing treatments and interventions, ordering and review of laboratory studies, ordering and review of radiographic studies, pulse oximetry, re-evaluation of patient's condition and review of old charts   I assumed direction of critical care for this patient from another provider in my specialty: no     (including critical care time)  Medications Ordered in ED Medications  enoxaparin (LOVENOX) injection 90 mg (90 mg Subcutaneous Given 09/09/16 2128)  sodium chloride  flush (NS) 0.9 % injection 3 mL (3 mLs Intravenous Given 09/09/16 2200)  sodium chloride flush (NS) 0.9 % injection 3 mL (3 mLs Intravenous Given 09/09/16 2200)  0.9 %  sodium chloride infusion (not administered)  sodium chloride flush (NS) 0.9 % injection 3 mL (not administered)  acetaminophen (TYLENOL) tablet 650 mg (not administered)    Or  acetaminophen (TYLENOL) suppository 650 mg (not administered)  HYDROcodone-acetaminophen (NORCO/VICODIN) 5-325 MG per tablet 1-2 tablet (not administered)  polyethylene glycol (MIRALAX / GLYCOLAX) packet 17 g (not administered)  bisacodyl (DULCOLAX) EC tablet 5 mg (not administered)  ondansetron (ZOFRAN) tablet 4 mg (not administered)    Or  ondansetron (ZOFRAN) injection 4 mg (not administered)  ipratropium-albuterol (DUONEB) 0.5-2.5 (3) MG/3ML nebulizer solution 3 mL (3 mLs Nebulization Given 09/09/16 2023)  methylPREDNISolone sodium succinate (SOLU-MEDROL)  125 mg/2 mL injection 60 mg (60 mg Intravenous Given 09/09/16 2128)  azithromycin (ZITHROMAX) 500 mg in dextrose 5 % 250 mL IVPB (500 mg Intravenous Given 09/09/16 2129)  amLODipine (NORVASC) tablet 10 mg (10 mg Oral Given 09/09/16 1906)  hydrochlorothiazide (HYDRODIURIL) tablet 25 mg (25 mg Oral Given 09/09/16 1906)  sertraline (ZOLOFT) tablet 100 mg (100 mg Oral Given 09/09/16 1906)  pantoprazole (PROTONIX) EC tablet 40 mg (40 mg Oral Given 09/09/16 1906)  ipratropium-albuterol (DUONEB) 0.5-2.5 (3) MG/3ML nebulizer solution 3 mL (3 mLs Nebulization Not Given 09/09/16 2030)  methylPREDNISolone sodium succinate (SOLU-MEDROL) 125 mg/2 mL injection 125 mg (125 mg Intravenous Given 09/09/16 1018)  magnesium sulfate IVPB 2 g 50 mL (0 g Intravenous Stopped 09/09/16 1118)  albuterol (PROVENTIL,VENTOLIN) solution continuous neb (10 mg/hr Nebulization Given 09/09/16 1023)  ipratropium (ATROVENT) nebulizer solution 1 mg (1 mg Nebulization Given 09/09/16 1023)  albuterol (PROVENTIL,VENTOLIN) solution continuous neb (10  mg/hr Nebulization Given 09/09/16 1342)  technetium TC 71M diethylenetriame-pentaacetic acid (DTPA) injection 30 millicurie (33 millicuries Inhalation Given 09/09/16 1430)  technetium albumin aggregated (MAA) injection solution 4 millicurie (4.2 millicuries Intravenous Contrast Given 09/09/16 1450)  albuterol (PROVENTIL) (2.5 MG/3ML) 0.083% nebulizer solution 5 mg (5 mg Nebulization Given 09/09/16 1704)  0.9 %  sodium chloride infusion ( Intravenous Transfusing/Transfer 09/09/16 1729)     Initial Impression / Assessment and Plan / ED Course  I have reviewed the triage vital signs and the nursing notes.  Pertinent labs & imaging results that were available during my care of the patient were reviewed by me and considered in my medical decision making (see chart for details).  Clinical Course   45 yo F with PMHx of asthma here with severe SOB in setting of several days of URI sx. Suspect viral URI with secondary severe asthma exacerbation. On arrival, pt hypoxic to 70s% on RA. Does not use O2 at baseline. Will start continuous nebs, steroids, mag, and obtain labs. PE on DDx given hypoxia though no LE edema or other risk factors - will check D-Dimer. EKG non-ischemic, doubt ACS.  CXR is clear. Labs unremarkable as above with exception of D-Dimer. Ordered CTA PE but pt unable to tolerate 2/2 WOB, unable to lie flat. V/Q scan subsequently obtained and is negative. Pt remains tachypneic with diffuse wheezing - will continue continuous nebs, admit to Medicine.  Pt improved after nebs. WOB improved but remains moderately hypoxic. Admitted to tele.  Final Clinical Impressions(s) / ED Diagnoses   Final diagnoses:  SOB (shortness of breath)  Asthma exacerbation  Acute respiratory failure with hypoxia [facility])    New Prescriptions Current Discharge Medication List     I personally performed the services described in this documentation, which was scribed in my presence. The recorded information has been  reviewed and is accurate.      Shaune Pollack, MD 09/10/16 [phone]

## 2016-09-09 NOTE — ED Notes (Signed)
Ct called and states pt can not tolerate lying flat with arms up due to sob. Increased 02 6L [place] and pt still unable to tolerate. Pt back on room. Nad. EDP aware and order to VQ scan.

## 2016-09-10 DIAGNOSIS — Z6841 Body Mass Index (BMI) 40.0 and over, adult: Secondary | ICD-10-CM | POA: Diagnosis not present

## 2016-09-10 DIAGNOSIS — J9801 Acute bronchospasm: Principal | ICD-10-CM

## 2016-09-10 DIAGNOSIS — K219 Gastro-esophageal reflux disease without esophagitis: Secondary | ICD-10-CM | POA: Diagnosis present

## 2016-09-10 DIAGNOSIS — J9601 Acute respiratory failure with hypoxia: Secondary | ICD-10-CM

## 2016-09-10 DIAGNOSIS — I509 Heart failure, unspecified: Secondary | ICD-10-CM | POA: Diagnosis not present

## 2016-09-10 DIAGNOSIS — R0602 Shortness of breath: Secondary | ICD-10-CM | POA: Diagnosis present

## 2016-09-10 DIAGNOSIS — G4733 Obstructive sleep apnea (adult) (pediatric): Secondary | ICD-10-CM | POA: Diagnosis present

## 2016-09-10 DIAGNOSIS — F329 Major depressive disorder, single episode, unspecified: Secondary | ICD-10-CM | POA: Diagnosis present

## 2016-09-10 DIAGNOSIS — I10 Essential (primary) hypertension: Secondary | ICD-10-CM | POA: Diagnosis present

## 2016-09-10 LAB — GLUCOSE, CAPILLARY
GLUCOSE-CAPILLARY: 128 mg/dL — AB (ref 65–99)
GLUCOSE-CAPILLARY: 163 mg/dL — AB (ref 65–99)

## 2016-09-10 LAB — BASIC METABOLIC PANEL
Anion gap: 7 (ref 5–15)
BUN: 16 mg/dL (ref 6–20)
CALCIUM: 8.8 mg/dL — AB (ref 8.9–10.3)
CO2: 33 mmol/L — ABNORMAL HIGH (ref 22–32)
CREATININE: 0.89 mg/dL (ref 0.44–1.00)
Chloride: 99 mmol/L — ABNORMAL LOW (ref 101–111)
GFR calc Af Amer: >60 mL/min (ref >60)
GLUCOSE: 129 mg/dL — AB (ref 65–99)
Potassium: 4.4 mmol/L (ref 3.5–5.1)
SODIUM: 139 mmol/L (ref 135–145)

## 2016-09-10 NOTE — Progress Notes (Signed)
PROGRESS NOTE    Ann Giles  WJX:[id number] DOB: 03/28/1971 DOA: 09/09/2016 PCP: No PCP Per Patient     Brief Narrative:  45 y/o woman admitted from home on 9/22 with SOB, cough and wheeze. Has never been formerly diagnosed with asthma but has been treated for this in the ED on multiple occasions.    Assessment & Plan:   Principal Problem:   Acute bronchospasm Active Problems:   Hypertension   Depression   Leg swelling   Acute respiratory failure with hypoxia (HCC)   Bronchospasm, acute   Acute Bronchospasm -Unclear if reactive airways disease/bronchitis from recent URI vs true asthma. -Continue PRN albuterol, azithromycin, steroids (as continues to have wheezing on exam). -Given several episodes over the past 6 weeks, might benefit from an inhaled steroid (flovent). -Should have pulmonary follow up upon DC for PFTs.  Acute Hypoxemic Respiratory Failure -Suspect mainly related to above, however would not be surprised if OSA/OHS and diastolic CHF are playing a role given her morbid obesity. -Check ECHO. -Will need to determine oxygen requirements prior to DC.  Morbid Obesity -Noted. -Counseled on weight loss and exercise.   DVT prophylaxis: lovenox Code Status: full code Family Communication: patient only Disposition Plan: likely home in 24-48 hours  Consultants:   None  Procedures:   None  Antimicrobials:   Azithromycin 9/21-->    Subjective: Still feels SOB today.  Objective: Vitals:   09/10/16 0547 09/10/16 0730 09/10/16 1315 09/10/16 1327  BP: (!) 164/97   (!) 155/79  Pulse: 62   75  Resp: 19   (!) 24  Temp: 97.8 F (36.6 C)   97.6 F (36.4 C)  TempSrc: Oral   Oral  SpO2: 95% 93% 96% 94%  Weight: (!) 178.4 kg (393 lb 3.2 oz)     Height:        Intake/Output Summary (Last 24 hours) at 09/10/16 1636 Last data filed at 09/10/16 1230  Gross per 24 hour  Intake              428 ml  Output                0 ml  Net               428 ml   Filed Weights   09/09/16 0947 09/10/16 0547  Weight: (!) 171 kg (377 lb) (!) 178.4 kg (393 lb 3.2 oz)    Examination:  General exam: Alert, awake, oriented x 3, morbidly obese Respiratory system: Bilateral expiratory wheezes Cardiovascular system:RRR. No murmurs, rubs, gallops. Gastrointestinal system: Abdomen is nondistended, soft and nontender. No organomegaly or masses felt. Normal bowel sounds heard. Central nervous system: Alert and oriented. No focal neurological deficits. Extremities: 1-2+ edema bilaterally, +pedal pulses Skin: No rashes, lesions or ulcers Psychiatry: Judgement and insight appear normal. Mood & affect appropriate.     Data Reviewed: I have personally reviewed following labs and imaging studies  CBC:  Recent Labs Lab 09/09/16 0950  WBC 6.8  NEUTROABS 4.4  HGB 11.4*  HCT 38.4  MCV 95.8  PLT 277   Basic Metabolic Panel:  Recent Labs Lab 09/09/16 1000 09/10/16 0623  NA 141 139  K 3.7 4.4  CL 99* 99*  CO2 35* 33*  GLUCOSE 106* 129*  BUN 16 16  CREATININE 0.99 0.89  CALCIUM 8.7* 8.8*   GFR: Estimated Creatinine Clearance: 138.2 mL/min (by C-G formula based on SCr of 0.89 mg/dL). Liver Function Tests:  Recent Labs Lab  09/09/16 1000  AST 12*  ALT 16  ALKPHOS 61  BILITOT 0.7  PROT 8.1  ALBUMIN 3.6   No results for input(s): LIPASE, AMYLASE in the last 168 hours. No results for input(s): AMMONIA in the last 168 hours. Coagulation Profile: No results for input(s): INR, PROTIME in the last 168 hours. Cardiac Enzymes:  Recent Labs Lab 09/09/16 1000  TROPONINI <0.03   BNP (last 3 results) No results for input(s): PROBNP in the last 8760 hours. HbA1C: No results for input(s): HGBA1C in the last 72 hours. CBG:  Recent Labs Lab 09/10/16 0729  GLUCAP 128*   Lipid Profile: No results for input(s): CHOL, HDL, LDLCALC, TRIG, CHOLHDL, LDLDIRECT in the last 72 hours. Thyroid Function Tests: No results for input(s): TSH,  T4TOTAL, FREET4, T3FREE, THYROIDAB in the last 72 hours. Anemia Panel: No results for input(s): VITAMINB12, FOLATE, FERRITIN, TIBC, IRON, RETICCTPCT in the last 72 hours. Urine analysis:    Component Value Date/Time   COLORURINE YELLOW 05/18/2016 1308   APPEARANCEUR CLEAR 05/18/2016 1308   LABSPEC 1.010 05/18/2016 1308   PHURINE 8.5 (H) 05/18/2016 1308   GLUCOSEU NEGATIVE 05/18/2016 1308   HGBUR LARGE (A) 05/18/2016 1308   BILIRUBINUR NEGATIVE 05/18/2016 1308   KETONESUR TRACE (A) 05/18/2016 1308   PROTEINUR 30 (A) 05/18/2016 1308   NITRITE NEGATIVE 05/18/2016 1308   LEUKOCYTESUR NEGATIVE 05/18/2016 1308   Sepsis Labs: @LABRCNTIP (procalcitonin:4,lacticidven:4)  )No results found for this or any previous visit (from the past 240 hour(s)).       Radiology Studies: Dg Chest 2 View  Result Date: 09/09/2016 CLINICAL DATA:  Pt reports cough and congestion for past few days. Reports sob today. chest pain. EXAM: CHEST  2 VIEW COMPARISON:  None. FINDINGS: Study quality is degraded by patient body habitus. No definite consolidations or pleural effusions. No pulmonary edema. IMPRESSION: No evidence for acute abnormality. Study compromised by patient body habitus. Electronically Signed   By: Norva Pavlov M.D.   On: 09/09/2016 10:23   Nm Pulmonary Perf And Vent  Result Date: 09/09/2016 CLINICAL DATA:  45 y/o F; 1 week of shortness of breath with some chest pain and tightness. EXAM: NUCLEAR MEDICINE VENTILATION - PERFUSION LUNG SCAN TECHNIQUE: Ventilation images were obtained in multiple projections using inhaled aerosol Tc-49m DTPA. Perfusion images were obtained in multiple projections after intravenous injection of Tc-50m MAA. RADIOPHARMACEUTICALS:  33 mCi Technetium-78m DTPA aerosol inhalation and 4.2 mCi Technetium-63m MAA IV COMPARISON:  09/09/2016 chest radiograph. FINDINGS: Ventilation: Poor ventilation radiotracer accumulation. Gastric uptake probably due to free technetium.  Heterogeneous ventilation in the left upper lobe. Perfusion: Probable small perfusion defect in the left upper lobe. Otherwise no wedge shaped peripheral perfusion defects to suggest acute pulmonary embolism. IMPRESSION: Small left upper perfusion defect, probably matched, poor ventilation phase radiotracer accumulation. Low probability of pulmonary embolus. Electronically Signed   By: Mitzi Hansen M.D.   On: 09/09/2016 15:40        Scheduled Meds: . amLODipine  10 mg Oral Daily  . azithromycin  500 mg Intravenous Q24H  . enoxaparin (LOVENOX) injection  90 mg Subcutaneous Q24H  . hydrochlorothiazide  25 mg Oral Daily  . ipratropium-albuterol  3 mL Nebulization TID  . methylPREDNISolone (SOLU-MEDROL) injection  60 mg Intravenous Q8H  . pantoprazole  40 mg Oral Daily  . sertraline  100 mg Oral Daily  . sodium chloride flush  3 mL Intravenous Q12H  . sodium chloride flush  3 mL Intravenous Q12H   Continuous Infusions:  LOS: 0 days    Time spent: 25 minutes. Greater than 50% of this time was spent in direct contact with the patient coordinating care.     Chaya JanHERNANDEZ ACOSTA,ESTELA, MD [facility] Pager [phone]  If 7PM-7AM, please contact night-coverage [email or web address] Password [email or web address] 09/10/2016, 4:36 PM

## 2016-09-11 ENCOUNTER — Inpatient Hospital Stay (HOSPITAL_COMMUNITY)

## 2016-09-11 DIAGNOSIS — I509 Heart failure, unspecified: Secondary | ICD-10-CM

## 2016-09-11 LAB — GLUCOSE, CAPILLARY
GLUCOSE-CAPILLARY: 155 mg/dL — AB (ref 65–99)
GLUCOSE-CAPILLARY: 182 mg/dL — AB (ref 65–99)
Glucose-Capillary: 129 mg/dL — ABNORMAL HIGH (ref 65–99)

## 2016-09-11 LAB — PROCALCITONIN: Procalcitonin: <0.1 ng/mL

## 2016-09-11 LAB — ECHOCARDIOGRAM COMPLETE
HEIGHTINCHES: 68 in
WEIGHTICAEL: 6377.47 [oz_av]

## 2016-09-11 NOTE — Progress Notes (Signed)
Echocardiogram 2D Echocardiogram has been performed.  Ann Giles N Janeisha Ryle 09/11/2016, 12:58 PM 

## 2016-09-11 NOTE — Progress Notes (Signed)
Rechecked pt's O2 sat. Currently reading 96% on 2LNC.

## 2016-09-11 NOTE — Progress Notes (Signed)
SATURATION QUALIFICATIONS: (This note is used to comply with regulatory documentation for home oxygen)  Patient Saturations on Room Air at Rest = 98 %  Patient Saturations on Room Air while Ambulating = 82 %  Patient Saturations on 3 Liters of oxygen while Ambulating = 93 %

## 2016-09-11 NOTE — Progress Notes (Signed)
Came to patient's room to get her on her BIPAP. Patient stated she was waiting on them to do another IV and to give her, her nightly medicines. I asked the RN to please call me after so I can come to place her on the BIPAP.

## 2016-09-11 NOTE — Progress Notes (Signed)
Pt's SpO2 was 80% when I first checked. Pt was wearing Tiger, but [place] was not hooked up to the flowmeter. The O2 tubing for the CPAP was hooked up to O2. Neb tx given, Pt placed back on 2LNC. Will recheck O2 sat shortly. RT will continue to monitor;

## 2016-09-11 NOTE — Progress Notes (Signed)
PROGRESS NOTE    Ann Giles  Ann EwingsUJ:[id number] DOB: [birth year] DOA: 09/09/2016 PCP: No PCP Per Patient     Brief Narrative:  [age] y/o woman admitted from home on 9/22 with SOB, cough and wheeze. Has never been formerly diagnosed with asthma but has been treated for this in the ED on multiple occasions.    Assessment & Plan:   Principal Problem:   Acute bronchospasm Active Problems:   Hypertension   Depression   Leg swelling   Acute respiratory failure with hypoxia (HCC)   Bronchospasm, acute   Acute Bronchospasm -Unclear if reactive airways disease/bronchitis from recent URI vs true asthma. -Continue PRN albuterol, azithromycin, steroids (as continues to have wheezing on exam). -Given several episodes over the past 6 weeks, might benefit from an inhaled steroid (flovent). Will start. -Should have pulmonary follow up upon DC for PFTs.  Acute Hypoxemic Respiratory Failure -Suspect mainly related to above, however would not be surprised if OSA/OHS and diastolic CHF are playing a role given her morbid obesity. -ECHO: EF 60% normal diastolic function. -Looks like will need oxygen on DC.  Morbid Obesity -Noted. -Counseled on weight loss and exercise.   DVT prophylaxis: lovenox Code Status: full code Family Communication: Sister at bedside updated on plan of care and all questions answered. Disposition Plan: likely home in 24-48 hours  Consultants:   None  Procedures:   None  Antimicrobials:   Azithromycin 9/21-->    Subjective: Still feels SOB today, altho much improved over the past 24 hours.  Objective: Vitals:   09/11/16 0654 09/11/16 0735 09/11/16 0755 09/11/16 1530  BP: (!) 162/91     Pulse: 84     Resp: 20     Temp: 98.5 F (36.9 C)     TempSrc: Axillary     SpO2: 98% (!) 80% 96% 94%  Weight: (!) 180.8 kg (398 lb 9.5 oz)     Height:       No intake or output data in the 24 hours ending 09/11/16 1719 Filed Weights   09/09/16 0947  09/10/16 0547 09/11/16 0654  Weight: (!) 171 kg (377 lb) (!) 178.4 kg (393 lb 3.2 oz) (!) 180.8 kg (398 lb 9.5 oz)    Examination:  General exam: Alert, awake, oriented x 3, morbidly obese Respiratory system: Bilateral expiratory wheezes Cardiovascular system:RRR. No murmurs, rubs, gallops. Gastrointestinal system: Abdomen is nondistended, soft and nontender. No organomegaly or masses felt. Normal bowel sounds heard. Central nervous system: Alert and oriented. No focal neurological deficits. Extremities: 1-2+ edema bilaterally, +pedal pulses Skin: No rashes, lesions or ulcers Psychiatry: Judgement and insight appear normal. Mood & affect appropriate.     Data Reviewed: I have personally reviewed following labs and imaging studies  CBC:  Recent Labs Lab 09/09/16 0950  WBC 6.8  NEUTROABS 4.4  HGB 11.4*  HCT 38.4  MCV 95.8  PLT 277   Basic Metabolic Panel:  Recent Labs Lab 09/09/16 1000 09/10/16 0623  NA 141 139  K 3.7 4.4  CL 99* 99*  CO2 35* 33*  GLUCOSE 106* 129*  BUN 16 16  CREATININE 0.99 0.89  CALCIUM 8.7* 8.8*   GFR: Estimated Creatinine Clearance: 139.5 mL/min (by C-G formula based on SCr of 0.89 mg/dL). Liver Function Tests:  Recent Labs Lab 09/09/16 1000  AST 12*  ALT 16  ALKPHOS 61  BILITOT 0.7  PROT 8.1  ALBUMIN 3.6   No results for input(s): LIPASE, AMYLASE in the last 168 hours. No results for  input(s): AMMONIA in the last 168 hours. Coagulation Profile: No results for input(s): INR, PROTIME in the last 168 hours. Cardiac Enzymes:  Recent Labs Lab 09/09/16 1000  TROPONINI <0.03   BNP (last 3 results) No results for input(s): PROBNP in the last 8760 hours. HbA1C: No results for input(s): HGBA1C in the last 72 hours. CBG:  Recent Labs Lab 09/10/16 0729 09/10/16 2101 09/11/16 0734 09/11/16 1214 09/11/16 1628  GLUCAP 128* 163* 129* 155* 182*   Lipid Profile: No results for input(s): CHOL, HDL, LDLCALC, TRIG, CHOLHDL,  LDLDIRECT in the last 72 hours. Thyroid Function Tests: No results for input(s): TSH, T4TOTAL, FREET4, T3FREE, THYROIDAB in the last 72 hours. Anemia Panel: No results for input(s): VITAMINB12, FOLATE, FERRITIN, TIBC, IRON, RETICCTPCT in the last 72 hours. Urine analysis:    Component Value Date/Time   COLORURINE YELLOW 05/18/2016 1308   APPEARANCEUR CLEAR 05/18/2016 1308   LABSPEC 1.010 05/18/2016 1308   PHURINE 8.5 (H) 05/18/2016 1308   GLUCOSEU NEGATIVE 05/18/2016 1308   HGBUR LARGE (A) 05/18/2016 1308   BILIRUBINUR NEGATIVE 05/18/2016 1308   KETONESUR TRACE (A) 05/18/2016 1308   PROTEINUR 30 (A) 05/18/2016 1308   NITRITE NEGATIVE 05/18/2016 1308   LEUKOCYTESUR NEGATIVE 05/18/2016 1308   Sepsis Labs: @LABRCNTIP (procalcitonin:4,lacticidven:4)  )No results found for this or any previous visit (from the past 240 hour(s)).       Radiology Studies: No results found.      Scheduled Meds: . amLODipine  10 mg Oral Daily  . azithromycin  500 mg Intravenous Q24H  . enoxaparin (LOVENOX) injection  90 mg Subcutaneous Q24H  . hydrochlorothiazide  25 mg Oral Daily  . ipratropium-albuterol  3 mL Nebulization TID  . methylPREDNISolone (SOLU-MEDROL) injection  60 mg Intravenous Q8H  . pantoprazole  40 mg Oral Daily  . sertraline  100 mg Oral Daily  . sodium chloride flush  3 mL Intravenous Q12H  . sodium chloride flush  3 mL Intravenous Q12H   Continuous Infusions:    LOS: 1 day    Time spent: 25 minutes. Greater than 50% of this time was spent in direct contact with the patient coordinating care.     Chaya Jan, MD [facility] Pager [phone]  If 7PM-7AM, please contact night-coverage [email or web address] Password [facility] 09/11/2016, 5:19 PM

## 2016-09-12 LAB — GLUCOSE, CAPILLARY: Glucose-Capillary: 165 mg/dL — ABNORMAL HIGH (ref 65–99)

## 2016-09-12 MED ORDER — FLUTICASONE PROPIONATE HFA 220 MCG/ACT IN AERO: 2.0000 | INHALATION_SPRAY | Freq: Two times a day (BID) | RESPIRATORY_TRACT | 2 refills | Status: DC

## 2016-09-12 MED ORDER — PREDNISONE 10 MG PO TABS: 10.0000 mg | ORAL_TABLET | Freq: Every day | ORAL | 0 refills | Status: DC

## 2016-09-12 NOTE — Progress Notes (Addendum)
CM confirmed address and correct number for patient.  Patient has not been on oxygen and company for oxygen to use [facility]. CM faxed paperwork to company for review.  CM called [facility] to check on status of oxygen delivery: spoke with Clydie BraunKaren and information received and confirmation# [phone] for the hospital delivery time will be from 1:00 pm to 5:00pm.

## 2016-09-12 NOTE — Discharge Summary (Signed)
Physician Discharge Summary  Ann Giles ZOX:[id number] DOB: [birth year] DOA: 09/09/2016  PCP: No PCP Per Patient  Admit date: 09/09/2016 Discharge date: 09/12/2016  Time spent: 45 minutes  Recommendations for Outpatient Follow-up:  -Will be discharged home today. -Has been given Dr. Juanetta Gosling office number for her to arrange a new patient appointment.   Discharge Diagnoses:  Principal Problem:   Acute bronchospasm Active Problems:   Hypertension   Depression   Leg swelling   Acute respiratory failure with hypoxia (HCC)   Bronchospasm, acute   Discharge Condition: Stable and improved  Filed Weights   09/10/16 0547 09/11/16 0654 09/12/16 0500  Weight: (!) 178.4 kg (393 lb 3.2 oz) (!) 180.8 kg (398 lb 9.5 oz) (!) 181.6 kg (400 lb 5.7 oz)    History of present illness:  As per Dr. Antionette Char on 9/21: Ann Giles is a 45 y.o. female with medical history significant for depression, hypertension, GERD, and recurrent episodes of acute bronchospasm responsive to bronchodilators who presents the emergency department with severe dyspnea and cough. Patient reports that she been in her usual state of health until 09/03/2016 when she developed some rhinorrhea, sinus congestion, and sore throat. Many of her coworkers had been suffering from similar symptoms and as they were all recovering in the last couple days, she was continuing to worsen. The rhinorrhea and sinus congestion has improved, as did the sore throat, but she has become increasingly dyspneic and has had cough and audible wheezing. She is coughing up thick dark sputum. She denies chest pain or palpitations and denies lightheadedness. She reports improvement in her chronic bilateral lower extremity edema and denies orthopnea. She reports similar episodes multiple times previously, but reports that they were not as severe. She had been diagnosed with asthma in the emergency department and had been given an albuterol inhaler  which she had been using successfully for occasional mild symptoms. She reports being evaluated by pulmonology in the past for her obstructive sleep apnea, but has never been evaluated for her bronchospastic disease.  ED Course: Upon arrival to the ED, patient is found to be afebrile, saturating 76% on room air, hypertensive to 155/103, and with normal heart rate. EKG demonstrates sinus rhythm with T-wave flattening in lateral leads. Troponin is undetectable and BNP is within the normal limits at 71. Chest x-ray is negative for acute cardiopulmonary disease, CMP features a bicarbonate of 35, and CBC is notable only for a normocytic anemia with hemoglobin 11.4. O2 saturations improved with the administration of 4 L/m supplemental oxygen via nasal cannula. Patient was treated with multiple neb treatments, IV magnesium, and 125 mg IV Solu-Medrol. Given the magnitude of her hypoxia, PE was considered and d-dimer was sent. D-dimer turns elevated to a value 1.40 and VQ scan was performed and is a low probability study for PE. Patient enjoyed some subjective improvement in her symptoms with the treatments administered in the ED. She has remained hemodynamically stable and is oxygenating well with 4 L/m via nasal cannula. 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  Hospital Course:   Acute Bronchospasm -Unclear if reactive airways disease/bronchitis from recent URI vs true asthma. -Continue PRN albuterol, azithromycin (completed), prednisone taper -Given several episodes over the past 6 weeks, might benefit from an inhaled steroid (flovent). Will start. -Should have pulmonary follow up upon DC for PFTs.  Acute Hypoxemic Respiratory Failure -Suspect mainly related to above,  however would not be surprised if OSA/OHS is playing a role given her morbid obesity. -ECHO: EF 60% normal diastolic function. (no  CHF) -Looks like will need oxygen on DC. Will arrange.  Morbid Obesity -Noted. -Counseled on weight loss and exercise.  Procedures: ECHO:  Study Conclusions  - Left ventricle: The cavity size was normal. Systolic function was   normal. The estimated ejection fraction was in the range of 60%   to 65%. Wall motion was normal; there were no regional wall   motion abnormalities. Left ventricular diastolic function    parameters were normal.  Consultations:  None  Discharge Instructions  Discharge Instructions    Diet - low sodium heart healthy    Complete by:  As directed    Increase activity slowly    Complete by:  As directed        Medication List    STOP taking these medications   meloxicam 7.5 MG tablet Commonly known as:  MOBIC   ondansetron 4 MG disintegrating tablet Commonly known as:  ZOFRAN ODT   oxyCODONE-acetaminophen 5-325 MG tablet Commonly known as:  PERCOCET/ROXICET   tamsulosin 0.4 MG Caps capsule Commonly known as:  FLOMAX     TAKE these medications   amLODipine 10 MG tablet Commonly known as:  NORVASC Take 10 mg by mouth daily.   fluticasone 220 MCG/ACT inhaler Commonly known as:  FLOVENT HFA Inhale 2 puffs into the lungs 2 (two) times daily.   hydrochlorothiazide 25 MG tablet Commonly known as:  HYDRODIURIL Take 25 mg by mouth daily.   ibuprofen 200 MG tablet Commonly known as:  ADVIL,MOTRIN Take 600 mg by mouth every 6 (six) hours as needed for mild pain or moderate pain.   omeprazole 20 MG capsule Commonly known as:  PRILOSEC Take 20 mg by mouth daily as needed (for acid reflux).   predniSONE 10 MG tablet Commonly known as:  DELTASONE Take 1 tablet (10 mg total) by mouth daily with breakfast. Take 6 tablets today and then decrease by 1 tablet daily until none are left.   sertraline 100 MG tablet Commonly known as:  ZOLOFT Take 100 mg by mouth daily.      No Known Allergies Follow-up Information    HAWKINS,EDWARD L,  MD. Schedule an appointment as soon as possible for a visit in 2 week(s).   Specialty:  Pulmonary Disease Contact information: [address] [place] [address] [phone]            The results of significant diagnostics from this hospitalization (including imaging, microbiology, ancillary and laboratory) are listed below for reference.    Significant Diagnostic Studies: Dg Chest 2 View  Result Date: 09/09/2016 CLINICAL DATA:  Pt reports cough and congestion for past few days. Reports sob today. chest pain. EXAM: CHEST  2 VIEW COMPARISON:  None. FINDINGS: Study quality is degraded by patient body habitus. No definite consolidations or pleural effusions. No pulmonary edema. IMPRESSION: No evidence for acute abnormality. Study compromised by patient body habitus. Electronically Signed   By: Norva Pavlov M.D.   On: 09/09/2016 10:23   Nm Pulmonary Perf And Vent  Result Date: 09/09/2016 CLINICAL DATA:  45 y/o F; 1 week of shortness of breath with some chest pain and tightness. EXAM: NUCLEAR MEDICINE VENTILATION - PERFUSION LUNG SCAN TECHNIQUE: Ventilation images were obtained in multiple projections using inhaled aerosol Tc-16m DTPA. Perfusion images were obtained in multiple projections after intravenous injection of Tc-38m MAA. RADIOPHARMACEUTICALS:  33 mCi  Technetium-3850m DTPA aerosol inhalation and 4.2 mCi Technetium-10250m MAA IV COMPARISON:  09/09/2016 chest radiograph. FINDINGS: Ventilation: Poor ventilation radiotracer accumulation. Gastric uptake probably due to free technetium. Heterogeneous ventilation in the left upper lobe. Perfusion: Probable small perfusion defect in the left upper lobe. Otherwise no wedge shaped peripheral perfusion defects to suggest acute pulmonary embolism. IMPRESSION: Small left upper perfusion defect, probably matched, poor ventilation phase radiotracer accumulation. Low probability of pulmonary embolus. Electronically Signed   By: Mitzi HansenLance   Furusawa-Stratton M.D.   On: 09/09/2016 15:40    Microbiology: No results found for this or any previous visit (from the past 240 hour(s)).   Labs: Basic Metabolic Panel:  Recent Labs Lab 09/09/16 1000 09/10/16 0623  NA 141 139  K 3.7 4.4  CL 99* 99*  CO2 35* 33*  GLUCOSE 106* 129*  BUN 16 16  CREATININE 0.99 0.89  CALCIUM 8.7* 8.8*   Liver Function Tests:  Recent Labs Lab 09/09/16 1000  AST 12*  ALT 16  ALKPHOS 61  BILITOT 0.7  PROT 8.1  ALBUMIN 3.6   No results for input(s): LIPASE, AMYLASE in the last 168 hours. No results for input(s): AMMONIA in the last 168 hours. CBC:  Recent Labs Lab 09/09/16 0950  WBC 6.8  NEUTROABS 4.4  HGB 11.4*  HCT 38.4  MCV 95.8  PLT 277   Cardiac Enzymes:  Recent Labs Lab 09/09/16 1000  TROPONINI <0.03   BNP: BNP (last 3 results)  Recent Labs  09/09/16 1000  BNP 71.0    ProBNP (last 3 results) No results for input(s): PROBNP in the last 8760 hours.  CBG:  Recent Labs Lab 09/10/16 2101 09/11/16 0734 09/11/16 1214 09/11/16 1628 09/12/16 0722  GLUCAP 163* 129* 155* 182* 165*       Signed:  HERNANDEZ ACOSTA,Isolde Skaff  [facility] Pager: [phone] 09/12/2016, 11:07 AM

## 2016-09-12 NOTE — Progress Notes (Signed)
Pt has discharge orders and home oxygen is present for the ride home. Went over discharge paperwork with patient and her mother. No questions at this time. Patient is requesting a note for work.   Quita SkyeMorgan P Dishmon, RN

## 2016-09-17 NOTE — Progress Notes (Signed)
TELEPHONE CONVERSATIONS: Patient called me yesterday and again today about her oxygen supplies. She was admitted and discharged on oxygen by Dr Ardyth HarpsHernandez, and I spoke with both Shanda BumpsJessica the RN who called the case manager Shanda BumpsJessica, and I was told that the reason she ran out of her oxygen was because she used the portable oxygen to do go work all day.  She has larger oxygen tank at home, and a concentrator at home.  It just that she wants to have more portable oxygen tanks so she won't need to have refills more frequently (5-6 of them).  She called me again today, and was upset because I could n't help her, so I asked Shanda BumpsJessica the case manager to speak with her again today, as Shanda BumpsJessica is available right this minute,  and see what we can do to help.  I suspect she will have to get her answer from her PCP or the oxygen supply company.   Houston SirenPeter Doni Widmer MD FACP.

## 2016-10-04 ENCOUNTER — Other Ambulatory Visit (HOSPITAL_COMMUNITY): Payer: Self-pay | Admitting: Respiratory Therapy

## 2016-10-04 DIAGNOSIS — J45909 Unspecified asthma, uncomplicated: Secondary | ICD-10-CM

## 2016-10-13 ENCOUNTER — Ambulatory Visit (HOSPITAL_COMMUNITY)
Admission: RE | Admit: 2016-10-13 | Discharge: 2016-10-13 | Disposition: A | Discharge status: Home / Self Care | Source: Ambulatory Visit | Attending: Pulmonary Disease | Admitting: Pulmonary Disease

## 2016-10-13 DIAGNOSIS — R942 Abnormal results of pulmonary function studies: Secondary | ICD-10-CM | POA: Insufficient documentation

## 2016-10-13 DIAGNOSIS — J45909 Unspecified asthma, uncomplicated: Secondary | ICD-10-CM | POA: Insufficient documentation

## 2016-10-13 LAB — PULMONARY FUNCTION TEST
DL/VA % pred: 112 %
DL/VA: 5.99 ml/min/mmHg/L
DLCO unc % pred: 64 %
DLCO unc: 20.12 ml/min/mmHg
FEF 25-75 Post: 2.66 L/s
FEF 25-75 Pre: 2.58 L/s
FEF2575-%Change-Post: 3 %
FEF2575-%Pred-Post: 89 %
FEF2575-%Pred-Pre: 86 %
FEV1-%Change-Post: 1 %
FEV1-%Pred-Post: 73 %
FEV1-%Pred-Pre: 72 %
FEV1-Post: 2.11 L
FEV1-Pre: 2.08 L
FEV1FVC-%Change-Post: 1 %
FEV1FVC-%Pred-Pre: 105 %
FEV6-%Change-Post: 0 %
FEV6-%Pred-Post: 68 %
FEV6-%Pred-Pre: 68 %
FEV6-Post: 2.39 L
FEV6-Pre: 2.39 L
FEV6FVC-%Pred-Post: 102 %
FEV6FVC-%Pred-Pre: 102 %
FVC-%Change-Post: 0 %
FVC-%Pred-Post: 67 %
FVC-%Pred-Pre: 67 %
FVC-Post: 2.39 L
FVC-Pre: 2.4 L
Post FEV1/FVC ratio: 88 %
Post FEV6/FVC ratio: 100 %
Pre FEV1/FVC ratio: 87 %
Pre FEV6/FVC Ratio: 100 %
RV % pred: 79 %
RV: 1.55 L
TLC % pred: 66 %
TLC: 3.83 L

## 2016-10-13 MED ORDER — ALBUTEROL SULFATE (2.5 MG/3ML) 0.083% IN NEBU
2.5000 mg | INHALATION_SOLUTION | Freq: Once | RESPIRATORY_TRACT | Status: AC
  Administered 2016-10-13: 2.5 mg via RESPIRATORY_TRACT

## 2017-12-23 ENCOUNTER — Inpatient Hospital Stay (HOSPITAL_COMMUNITY)

## 2017-12-23 ENCOUNTER — Other Ambulatory Visit: Payer: Self-pay

## 2017-12-23 ENCOUNTER — Encounter (HOSPITAL_COMMUNITY): Payer: Self-pay | Admitting: Emergency Medicine

## 2017-12-23 ENCOUNTER — Emergency Department (HOSPITAL_COMMUNITY)

## 2017-12-23 ENCOUNTER — Inpatient Hospital Stay (HOSPITAL_COMMUNITY)
Admission: EM | Admit: 2017-12-23 | Discharge: 2017-12-26 | DRG: 291 | Disposition: A | Discharge status: Home / Self Care | Attending: Internal Medicine | Admitting: Internal Medicine

## 2017-12-23 DIAGNOSIS — J069 Acute upper respiratory infection, unspecified: Secondary | ICD-10-CM

## 2017-12-23 DIAGNOSIS — F329 Major depressive disorder, single episode, unspecified: Secondary | ICD-10-CM | POA: Diagnosis present

## 2017-12-23 DIAGNOSIS — I5031 Acute diastolic (congestive) heart failure: Secondary | ICD-10-CM | POA: Diagnosis present

## 2017-12-23 DIAGNOSIS — Z79899 Other long term (current) drug therapy: Secondary | ICD-10-CM

## 2017-12-23 DIAGNOSIS — R600 Localized edema: Secondary | ICD-10-CM | POA: Diagnosis present

## 2017-12-23 DIAGNOSIS — J9601 Acute respiratory failure with hypoxia: Secondary | ICD-10-CM | POA: Diagnosis present

## 2017-12-23 DIAGNOSIS — K219 Gastro-esophageal reflux disease without esophagitis: Secondary | ICD-10-CM | POA: Diagnosis present

## 2017-12-23 DIAGNOSIS — G4733 Obstructive sleep apnea (adult) (pediatric): Secondary | ICD-10-CM | POA: Diagnosis present

## 2017-12-23 DIAGNOSIS — I10 Essential (primary) hypertension: Secondary | ICD-10-CM | POA: Diagnosis present

## 2017-12-23 DIAGNOSIS — E66813 Obesity, class 3: Secondary | ICD-10-CM

## 2017-12-23 DIAGNOSIS — I110 Hypertensive heart disease with heart failure: Secondary | ICD-10-CM | POA: Diagnosis present

## 2017-12-23 DIAGNOSIS — Z9119 Patient's noncompliance with other medical treatment and regimen: Secondary | ICD-10-CM

## 2017-12-23 DIAGNOSIS — I509 Heart failure, unspecified: Secondary | ICD-10-CM | POA: Diagnosis not present

## 2017-12-23 DIAGNOSIS — Z6841 Body Mass Index (BMI) 40.0 and over, adult: Secondary | ICD-10-CM

## 2017-12-23 DIAGNOSIS — J45901 Unspecified asthma with (acute) exacerbation: Secondary | ICD-10-CM | POA: Diagnosis present

## 2017-12-23 DIAGNOSIS — Z86711 Personal history of pulmonary embolism: Secondary | ICD-10-CM

## 2017-12-23 DIAGNOSIS — J9801 Acute bronchospasm: Secondary | ICD-10-CM | POA: Diagnosis present

## 2017-12-23 LAB — TROPONIN I: Troponin I: <0.03 ng/mL (ref <0.03)

## 2017-12-23 LAB — CBC WITH DIFFERENTIAL/PLATELET
Basophils Absolute: 0 10*3/uL (ref 0.0–0.1)
Basophils Relative: 0 %
EOS PCT: 2 %
Eosinophils Absolute: 0.1 10*3/uL (ref 0.0–0.7)
HCT: 42.1 % (ref 36.0–46.0)
Hemoglobin: 11.9 g/dL — ABNORMAL LOW (ref 12.0–15.0)
LYMPHS ABS: 1.9 10*3/uL (ref 0.7–4.0)
LYMPHS PCT: 29 %
MCH: 26.7 pg (ref 26.0–34.0)
MCHC: 28.3 g/dL — ABNORMAL LOW (ref 30.0–36.0)
MCV: 94.4 fL (ref 78.0–100.0)
MONOS PCT: 11 %
Monocytes Absolute: 0.7 10*3/uL (ref 0.1–1.0)
Neutro Abs: 3.8 10*3/uL (ref 1.7–7.7)
Neutrophils Relative %: 58 %
PLATELETS: 220 10*3/uL (ref 150–400)
RBC: 4.46 MIL/uL (ref 3.87–5.11)
RDW: 14.8 % (ref 11.5–15.5)
WBC: 6.6 10*3/uL (ref 4.0–10.5)

## 2017-12-23 LAB — COMPREHENSIVE METABOLIC PANEL
ALBUMIN: 3.5 g/dL (ref 3.5–5.0)
ALK PHOS: 61 U/L (ref 38–126)
ALT: 17 U/L (ref 14–54)
ANION GAP: 10 (ref 5–15)
AST: 13 U/L — AB (ref 15–41)
BUN: 12 mg/dL (ref 6–20)
CALCIUM: 8.9 mg/dL (ref 8.9–10.3)
CO2: 33 mmol/L — AB (ref 22–32)
CREATININE: 1.11 mg/dL — AB (ref 0.44–1.00)
Chloride: 99 mmol/L — ABNORMAL LOW (ref 101–111)
GFR calc Af Amer: >60 mL/min (ref >60)
GFR calc non Af Amer: 58 mL/min — ABNORMAL LOW (ref >60)
GLUCOSE: 97 mg/dL (ref 65–99)
Potassium: 4.3 mmol/L (ref 3.5–5.1)
Sodium: 142 mmol/L (ref 135–145)
Total Bilirubin: 0.5 mg/dL (ref 0.3–1.2)
Total Protein: 7.3 g/dL (ref 6.5–8.1)

## 2017-12-23 LAB — BRAIN NATRIURETIC PEPTIDE: B Natriuretic Peptide: 43 pg/mL (ref 0.0–100.0)

## 2017-12-23 MED ORDER — SODIUM CHLORIDE 0.9% FLUSH: 3.0000 mL | INTRAVENOUS | Status: DC | PRN

## 2017-12-23 MED ORDER — ENOXAPARIN SODIUM 80 MG/0.8ML ~~LOC~~ SOLN
80.0000 mg | SUBCUTANEOUS | Status: DC
  Administered 2017-12-23 – 2017-12-25 (×3): 80 mg via SUBCUTANEOUS
  Filled 2017-12-23 (×3): qty 0.8

## 2017-12-23 MED ORDER — FUROSEMIDE 10 MG/ML IJ SOLN
40.0000 mg | Freq: Two times a day (BID) | INTRAMUSCULAR | Status: AC
  Administered 2017-12-23 – 2017-12-25 (×5): 40 mg via INTRAVENOUS
  Filled 2017-12-23 (×5): qty 4

## 2017-12-23 MED ORDER — LISINOPRIL 5 MG PO TABS
2.5000 mg | ORAL_TABLET | Freq: Every day | ORAL | Status: DC
  Administered 2017-12-23 – 2017-12-26 (×4): 2.5 mg via ORAL
  Filled 2017-12-23 (×4): qty 1

## 2017-12-23 MED ORDER — FUROSEMIDE 10 MG/ML IJ SOLN
40.0000 mg | Freq: Once | INTRAMUSCULAR | Status: AC
  Administered 2017-12-23: 40 mg via INTRAVENOUS
  Filled 2017-12-23: qty 4

## 2017-12-23 MED ORDER — IPRATROPIUM-ALBUTEROL 0.5-2.5 (3) MG/3ML IN SOLN
3.0000 mL | Freq: Four times a day (QID) | RESPIRATORY_TRACT | Status: DC
  Administered 2017-12-23 – 2017-12-25 (×9): 3 mL via RESPIRATORY_TRACT
  Filled 2017-12-23 (×9): qty 3

## 2017-12-23 MED ORDER — SODIUM CHLORIDE 0.9% FLUSH
3.0000 mL | Freq: Two times a day (BID) | INTRAVENOUS | Status: DC
  Administered 2017-12-23 – 2017-12-26 (×6): 3 mL via INTRAVENOUS

## 2017-12-23 MED ORDER — ONDANSETRON HCL 4 MG/2ML IJ SOLN: 4.0000 mg | Freq: Four times a day (QID) | INTRAMUSCULAR | Status: DC | PRN

## 2017-12-23 MED ORDER — IPRATROPIUM-ALBUTEROL 0.5-2.5 (3) MG/3ML IN SOLN
3.0000 mL | Freq: Once | RESPIRATORY_TRACT | Status: AC
  Administered 2017-12-23: 3 mL via RESPIRATORY_TRACT
  Filled 2017-12-23: qty 3

## 2017-12-23 MED ORDER — METHYLPREDNISOLONE SODIUM SUCC 125 MG IJ SOLR
125.0000 mg | Freq: Once | INTRAMUSCULAR | Status: AC
Start: 2017-12-23 — End: 2017-12-23
  Administered 2017-12-23: 125 mg via INTRAVENOUS
  Filled 2017-12-23: qty 2

## 2017-12-23 MED ORDER — SODIUM CHLORIDE 0.9 % IV SOLN: 250.0000 mL | INTRAVENOUS | Status: DC | PRN

## 2017-12-23 MED ORDER — ACETAMINOPHEN 325 MG PO TABS: 650.0000 mg | ORAL_TABLET | ORAL | Status: DC | PRN

## 2017-12-23 MED ORDER — PANTOPRAZOLE SODIUM 40 MG PO TBEC
40.0000 mg | DELAYED_RELEASE_TABLET | Freq: Every day | ORAL | Status: DC
  Administered 2017-12-23 – 2017-12-26 (×4): 40 mg via ORAL
  Filled 2017-12-23 (×4): qty 1

## 2017-12-23 MED ORDER — AMLODIPINE BESYLATE 5 MG PO TABS
10.0000 mg | ORAL_TABLET | Freq: Every day | ORAL | Status: DC
  Administered 2017-12-23 – 2017-12-26 (×4): 10 mg via ORAL
  Filled 2017-12-23 (×4): qty 2

## 2017-12-23 MED ORDER — ALBUTEROL SULFATE (2.5 MG/3ML) 0.083% IN NEBU
5.0000 mg | INHALATION_SOLUTION | Freq: Once | RESPIRATORY_TRACT | Status: AC
  Administered 2017-12-23: 5 mg via RESPIRATORY_TRACT
  Filled 2017-12-23: qty 6

## 2017-12-23 MED ORDER — SERTRALINE HCL 50 MG PO TABS
100.0000 mg | ORAL_TABLET | Freq: Every day | ORAL | Status: DC
  Administered 2017-12-23 – 2017-12-26 (×4): 100 mg via ORAL
  Filled 2017-12-23 (×4): qty 2

## 2017-12-23 NOTE — H&P (Signed)
History and Physical  Ann Giles [id number] DOB: 28-Apr-1971 DOA: 12/23/2017   PCP: Patient, No Pcp Per   Patient coming from: Home  Chief Complaint: dyspnea  HPI:  Ann Giles is a 47 y.o. female with medical history of depression, hypertension, GERD, and bronchospasm presents with 4-day history of worsening shortness of breath and nonproductive cough.  The patient has been using her albuterol inhaler without much relief.  She has been complaining of increasing lower extremity edema and orthopnea for the past week.  She has had to sleep sitting up in a recliner.  She denies any recent sick contacts or travels.  She denies any nausea, vomiting, diarrhea, abdominal pain, dysuria, hematuria. In the emergency department, the patient had oxygen saturation 89-90% on room air.  She was afebrile hemodynamically stable.  BMP, CBC, LFTs were unremarkable.  Chest x-ray did not show any infiltrates or pulmonary pulmonary edema.  EKG shows sinus rhythm with nonspecific T wave changes.  Assessment/Plan: Acute respiratory failure with hypoxia -Presently stable on 2 L -Multifactorial including CHF, bronchospasm and underlying OSA.  Acute diastolic CHF -The patient appears clinically volume overloaded -Start IV furosemide twice daily -Daily weights -Accurate I's and O's -Echocardiogram -suspect this is mostly right sided CHF from OSA/OHS  Bronchospasm -10/04/2016 PFTs--showed diffusion defect and interstitial process without airflow obstruction.  There was no bronchodilator response -Trial of albuterol nebulizer  Essential hypertension -Continue amlodipine -Anticipate improvement with diuresis  Depression -Continue Zoloft  Lower Extremity Edema and pain -venous duplex r/o DVT  Morbid Obesity -BMI 56.5     Past Medical History:  Diagnosis Date  . Arthritis   . Asthma   . Bronchitis   . Depression   . Hypertension    Past Surgical History:  Procedure  Laterality Date  . ABDOMINAL HYSTERECTOMY    . CHOLECYSTECTOMY    . GASTROPLASTY     Social History:  reports that  has never smoked. she has never used smokeless tobacco. She reports that she does not drink alcohol or use drugs.   Family History  Problem Relation Age of Onset  . Asthma Neg Hx      No Known Allergies   Prior to Admission medications   Medication Sig Start Date End Date Taking? Authorizing Provider  albuterol (PROVENTIL HFA;VENTOLIN HFA) 108 (90 Base) MCG/ACT inhaler Inhale 2 puffs into the lungs every 6 (six) hours as needed for wheezing or shortness of breath.   Yes [provider]  fluticasone (FLOVENT HFA) 220 MCG/ACT inhaler Inhale 2 puffs into the lungs 2 (two) times daily. 09/12/16  Yes Philip AspenHernandez Acosta, Limmie PatriciaEstela Y, MD  ibuprofen (ADVIL,MOTRIN) 200 MG tablet Take 600 mg by mouth every 6 (six) hours as needed for mild pain or moderate pain.   Yes [provider]  omeprazole (PRILOSEC) 20 MG capsule Take 20 mg by mouth daily as needed (for acid reflux).    Yes [provider]  sertraline (ZOLOFT) 100 MG tablet Take 100 mg by mouth daily. 02/13/16  Yes [provider]  amLODipine (NORVASC) 10 MG tablet Take 10 mg by mouth daily. 12/01/15   [provider]    Review of Systems:  Constitutional:  No weight loss, night sweats Head&Eyes: No headache.  No vision loss.  No eye pain or scotoma ENT:  No Difficulty swallowing,Tooth/dental problems,Sore throat,  No ear ache, post nasal drip,  Cardio-vascular:  No  dizziness, palpitations  GI:  No  abdominal pain, nausea, vomiting,  diarrhea, loss of appetite, hematochezia, melena, heartburn, indigestion, Resp:  No shortness of breath with exertion or at rest. No cough. No coughing up of blood .No wheezing.No chest wall deformity  Skin:  no rash or lesions.  GU:  no dysuria, change in color of urine, no urgency or frequency. No flank pain.  Musculoskeletal:  No joint pain  or swelling. No decreased range of motion. No back pain.  Psych:  No change in mood or affect. No depression or anxiety. Neurologic: No headache, no dysesthesia, no focal weakness, no vision loss. No syncope  Physical Exam: Vitals:   12/23/17 1132 12/23/17 1133 12/23/17 1134 12/23/17 1135  BP:  (!) 156/98    Pulse: 65 61 (!) 53 62  Resp: 20 16 14 18   Temp:      TempSrc:      SpO2: 99% 99% 95% 98%  Weight:      Height:       General:  A&O x 3, NAD, nontoxic, pleasant/cooperative Head/Eye: No conjunctival hemorrhage, no icterus, /AT, No nystagmus ENT:  No icterus,  No thrush, good dentition, no pharyngeal exudate Neck:  No masses, no lymphadenpathy, no bruits CV:  RRR, no rub, no gallop, no S3 Lung: Bibasilar crackles.  Bilateral scattered wheezing.  Good air movement. Abdomen: soft/NT, +BS, nondistended, no peritoneal signs Ext: No cyanosis, No rashes, No petechiae, No lymphangitis, 2 + LE edema Neuro: CNII-XII intact, strength 4/5 in bilateral upper and lower extremities, no dysmetria  Labs on Admission:  Basic Metabolic Panel: Recent Labs  Lab 12/23/17 0831  NA 142  K 4.3  CL 99*  CO2 33*  GLUCOSE 97  BUN 12  CREATININE 1.11*  CALCIUM 8.9   Liver Function Tests: Recent Labs  Lab 12/23/17 0831  AST 13*  ALT 17  ALKPHOS 61  BILITOT 0.5  PROT 7.3  ALBUMIN 3.5   No results for input(s): LIPASE, AMYLASE in the last 168 hours. No results for input(s): AMMONIA in the last 168 hours. CBC: Recent Labs  Lab 12/23/17 0831  WBC 6.6  NEUTROABS 3.8  HGB 11.9*  HCT 42.1  MCV 94.4  PLT 220   Coagulation Profile: No results for input(s): INR, PROTIME in the last 168 hours. Cardiac Enzymes: Recent Labs  Lab 12/23/17 0831  TROPONINI <0.03   BNP: Invalid input(s): POCBNP CBG: No results for input(s): GLUCAP in the last 168 hours. Urine analysis:    Component Value Date/Time   COLORURINE YELLOW 05/18/2016 1308   APPEARANCEUR CLEAR 05/18/2016 1308    LABSPEC 1.010 05/18/2016 1308   PHURINE 8.5 (H) 05/18/2016 1308   GLUCOSEU NEGATIVE 05/18/2016 1308   HGBUR LARGE (A) 05/18/2016 1308   BILIRUBINUR NEGATIVE 05/18/2016 1308   KETONESUR TRACE (A) 05/18/2016 1308   PROTEINUR 30 (A) 05/18/2016 1308   NITRITE NEGATIVE 05/18/2016 1308   LEUKOCYTESUR NEGATIVE 05/18/2016 1308   Sepsis Labs: @LABRCNTIP (procalcitonin:4,lacticidven:4) )No results found for this or any previous visit (from the past 240 hour(s)).   Radiological Exams on Admission: Dg Chest 2 View  Result Date: 12/23/2017 CLINICAL DATA:  Shortness of breath. EXAM: CHEST  2 VIEW COMPARISON:  Radiographs of September 09, 2016. FINDINGS: The heart size and mediastinal contours are within normal limits. Both lungs are clear. No pneumothorax or pleural effusion is noted. The visualized skeletal structures are unremarkable. IMPRESSION: No active cardiopulmonary disease. Electronically Signed   By: Lupita Raider, M.D.   On: 12/23/2017 09:16    EKG: Independently reviewed. Sinus, nonspecific T wave  changes    Time spent:60 minutes Code Status:   FULL Family Communication:  Mother updated at bedside 1/4 Disposition Plan: expect 2-3 day hospitalization Consults called: none  DVT Prophylaxis: [employer] Sol Passer, DO  [facility] Pager [phone]  If 7PM-7AM, please contact night-coverage [email or web address] Password [email or web address] 12/23/2017, 11:41 AM

## 2017-12-23 NOTE — ED Provider Notes (Signed)
[facility] Provider Note   CSN: [id number] Arrival date & time: 12/23/17  [id number]     History   Chief Complaint Chief Complaint  Patient presents with  . Chest Pain  . Shortness of Breath    HPI Ann Giles is a 47 y.o. female.  HPI Patient presents with shortness of breath cough with chest pain for the last 4 days.  States she has a cough with minimal production.  States it feels like there is something there that nothing will come up.  Some chest tightness.  Some swelling in his legs.  No fevers or chills.  States she does feel bad all over though.  Does have a history of hypertension.  Also history of asthma. Past Medical History:  Diagnosis Date  . Arthritis   . Asthma   . Bronchitis   . Depression   . Hypertension     Patient Active Problem List   Diagnosis Date Noted  . Acute bronchospasm 09/09/2016  . Asthma 09/09/2016  . Hypertension 09/09/2016  . Depression 09/09/2016  . Leg swelling 09/09/2016  . Acute respiratory failure with hypoxia (HCC) 09/09/2016  . Bronchospasm, acute 09/09/2016    Past Surgical History:  Procedure Laterality Date  . ABDOMINAL HYSTERECTOMY    . CHOLECYSTECTOMY    . GASTROPLASTY      OB History    No data available       Home Medications    Prior to Admission medications   Medication Sig Start Date End Date Taking? Authorizing Provider  albuterol (PROVENTIL HFA;VENTOLIN HFA) 108 (90 Base) MCG/ACT inhaler Inhale 2 puffs into the lungs every 6 (six) hours as needed for wheezing or shortness of breath.   Yes [provider]  fluticasone (FLOVENT HFA) 220 MCG/ACT inhaler Inhale 2 puffs into the lungs 2 (two) times daily. 09/12/16  Yes Philip AspenHernandez Acosta, Limmie PatriciaEstela Y, MD  ibuprofen (ADVIL,MOTRIN) 200 MG tablet Take 600 mg by mouth every 6 (six) hours as needed for mild pain or moderate pain.   Yes [provider]  omeprazole (PRILOSEC) 20 MG capsule Take 20 mg by mouth daily as needed (for  acid reflux).    Yes [provider]  sertraline (ZOLOFT) 100 MG tablet Take 100 mg by mouth daily. 02/13/16  Yes [provider]  amLODipine (NORVASC) 10 MG tablet Take 10 mg by mouth daily. 12/01/15   [provider]    Family History Family History  Problem Relation Age of Onset  . Asthma Neg Hx     Social History Social History   Tobacco Use  . Smoking status: Never Smoker  . Smokeless tobacco: Never Used  Substance Use Topics  . Alcohol use: No  . Drug use: No     Allergies   Patient has no known allergies.   Review of Systems Review of Systems  Constitutional: Positive for appetite change and fatigue. Negative for fever.  HENT: Positive for congestion.   Respiratory: Positive for cough and shortness of breath.   Cardiovascular: Positive for chest pain and leg swelling.  Gastrointestinal: Negative for abdominal pain.  Genitourinary: Negative for flank pain.  Musculoskeletal: Negative for back pain.  Skin: Negative for rash.  Neurological: Negative for numbness.  Hematological: Negative for adenopathy.  Psychiatric/Behavioral: Negative for confusion.     Physical Exam Updated Vital Signs BP (!) 161/85   Pulse 64   Temp 98.2 F (36.8 C) (Oral)   Resp (!) 21   Ht 5\' 8"  (1.727  m)   Wt (!) 168.7 kg (372 lb)   SpO2 91%   BMI 56.56 kg/m   Physical Exam  Constitutional: She appears well-developed.  HENT:  Head: Normocephalic.  Eyes: Pupils are equal, round, and reactive to light.  Cardiovascular: Normal rate.  Pulmonary/Chest: Effort normal.  Diffuse harsh breath sounds with prolonged expirations.  Abdominal:  Pitting edema bilateral lower extremities.  Musculoskeletal:       Right lower leg: She exhibits edema.       Left lower leg: She exhibits edema.  Neurological: She is alert.  Skin: Capillary refill takes less than 2 seconds.     ED Treatments / Results  Labs (all labs ordered are listed, but only abnormal  results are displayed) Labs Reviewed  COMPREHENSIVE METABOLIC PANEL - Abnormal; Notable for the following components:      Result Value   Chloride 99 (*)    CO2 33 (*)    Creatinine, Ser 1.11 (*)    AST 13 (*)    GFR calc non Af Amer 58 (*)    All other components within normal limits  CBC WITH DIFFERENTIAL/PLATELET - Abnormal; Notable for the following components:   Hemoglobin 11.9 (*)    MCHC 28.3 (*)    All other components within normal limits  BRAIN NATRIURETIC PEPTIDE  TROPONIN I    EKG  EKG Interpretation  Date/Time:  Friday December 23 2017 07:53:16 EST Ventricular Rate:  79 PR Interval:    QRS Duration: 92 QT Interval:  413 QTC Calculation: 474 R Axis:   86 Text Interpretation:  Sinus rhythm Borderline T abnormalities, diffuse leads Confirmed by Benjiman Core [phone]) on 12/23/2017 7:56:31 AM       Radiology Dg Chest 2 View  Result Date: 12/23/2017 CLINICAL DATA:  Shortness of breath. EXAM: CHEST  2 VIEW COMPARISON:  Radiographs of September 09, 2016. FINDINGS: The heart size and mediastinal contours are within normal limits. Both lungs are clear. No pneumothorax or pleural effusion is noted. The visualized skeletal structures are unremarkable. IMPRESSION: No active cardiopulmonary disease. Electronically Signed   By: Lupita Raider, M.D.   On: 12/23/2017 09:16    Procedures Procedures (including critical care time)  Medications Ordered in ED Medications  albuterol (PROVENTIL) (2.5 MG/3ML) 0.083% nebulizer solution 5 mg (not administered)  methylPREDNISolone sodium succinate (SOLU-MEDROL) 125 mg/2 mL injection 125 mg (not administered)  ipratropium-albuterol (DUONEB) 0.5-2.5 (3) MG/3ML nebulizer solution 3 mL (3 mLs Nebulization Given 12/23/17 0926)     Initial Impression / Assessment and Plan / ED Course  I have reviewed the triage vital signs and the nursing notes.  Pertinent labs & imaging results that were available during my care of the patient were  reviewed by me and considered in my medical decision making (see chart for details).     Patient with shortness of breath and cough.  Generalized weakness.  Chest tightness.  History of asthma.  Still moderately dyspneic after breathing treatment.  Dyspnea at rest.  Will admit to hospitalist for further treatment.  EKG reassuring.  Enzymes negative.  Doubt pulmonary embolism.  Final Clinical Impressions(s) / ED Diagnoses   Final diagnoses:  Exacerbation of asthma, unspecified asthma severity, unspecified whether persistent  Upper respiratory tract infection, unspecified type    ED Discharge Orders    None       Benjiman Core, MD 12/23/17 [id number]

## 2017-12-23 NOTE — ED Triage Notes (Signed)
Pt reports chest tightness and shortness of breath x 4 days with generalized weakness.

## 2017-12-24 ENCOUNTER — Inpatient Hospital Stay (HOSPITAL_COMMUNITY)

## 2017-12-24 DIAGNOSIS — I509 Heart failure, unspecified: Secondary | ICD-10-CM

## 2017-12-24 LAB — BASIC METABOLIC PANEL
Anion gap: 11 (ref 5–15)
BUN: 15 mg/dL (ref 6–20)
CALCIUM: 9.2 mg/dL (ref 8.9–10.3)
CO2: 34 mmol/L — AB (ref 22–32)
Chloride: 96 mmol/L — ABNORMAL LOW (ref 101–111)
Creatinine, Ser: 1.04 mg/dL — ABNORMAL HIGH (ref 0.44–1.00)
Glucose, Bld: 133 mg/dL — ABNORMAL HIGH (ref 65–99)
POTASSIUM: 4.2 mmol/L (ref 3.5–5.1)
Sodium: 141 mmol/L (ref 135–145)

## 2017-12-24 LAB — RESPIRATORY PANEL BY PCR
ADENOVIRUS-RVPPCR: NOT DETECTED
Bordetella pertussis: NOT DETECTED
CHLAMYDOPHILA PNEUMONIAE-RVPPCR: NOT DETECTED
CORONAVIRUS 229E-RVPPCR: NOT DETECTED
CORONAVIRUS NL63-RVPPCR: NOT DETECTED
Coronavirus HKU1: NOT DETECTED
Coronavirus OC43: NOT DETECTED
INFLUENZA B-RVPPCR: NOT DETECTED
Influenza A: NOT DETECTED
MYCOPLASMA PNEUMONIAE-RVPPCR: NOT DETECTED
Metapneumovirus: NOT DETECTED
PARAINFLUENZA VIRUS 1-RVPPCR: NOT DETECTED
PARAINFLUENZA VIRUS 4-RVPPCR: NOT DETECTED
Parainfluenza Virus 2: NOT DETECTED
Parainfluenza Virus 3: NOT DETECTED
Respiratory Syncytial Virus: NOT DETECTED
Rhinovirus / Enterovirus: NOT DETECTED

## 2017-12-24 LAB — HIV ANTIBODY (ROUTINE TESTING W REFLEX): HIV SCREEN 4TH GENERATION: NONREACTIVE

## 2017-12-24 LAB — ECHOCARDIOGRAM COMPLETE
HEIGHTINCHES: 68 in
WEIGHTICAEL: 6388.05 [oz_av]

## 2017-12-24 NOTE — Progress Notes (Signed)
PROGRESS NOTE  Ann Giles:[id number] DOB: October 13, 1971 DOA: 12/23/2017 PCP: Patient, No Pcp Per  Brief History:  47 y.o. female with medical history of depression, hypertension, GERD, and bronchospasm presents with 4-day history of worsening shortness of breath and nonproductive cough.  The patient has been using her albuterol inhaler without much relief.  She has been complaining of increasing lower extremity edema and orthopnea for the past week.  She has had to sleep sitting up in a recliner.  She denies any recent sick contacts or travels.  She denies any nausea, vomiting, diarrhea, abdominal pain, dysuria, hematuria. In the emergency department, the patient had oxygen saturation 88-90% on room air.  She was afebrile hemodynamically stable.  BMP, CBC, LFTs were unremarkable.  Chest x-ray did not show any infiltrates or pulmonary edema, but had vascular congestion.  EKG shows sinus rhythm with nonspecific T wave changes.    Assessment/Plan: Acute respiratory failure with hypoxia -Presently stable on 2 L -Multifactorial including CHF, bronchospasm and underlying OSA.  Acute diastolic CHF -The patient appears clinically volume overloaded -up 20+ lbs from 1 year ago -continue IV furosemide twice daily -Daily weights -Accurate I's and O's--NEG 2.4L -Echocardiogram -suspect this is mostly right sided CHF from OSA/OHS  Bronchospasm -10/04/2016 PFTs--showed diffusion defect and interstitial process without airflow obstruction.  There was no bronchodilator response -continue BDs  Essential hypertension -Continue amlodipine and lisinopril -Anticipate improvement with diuresis  Depression -Continue Zoloft  Lower Extremity Edema and pain -venous duplex---neg DVT  Morbid Obesity -BMI 56.5  Disposition Plan:   Home in 2-3 days  Family Communication:  Mother updated at bedside  Consultants:  none  Code Status:  FULL  DVT Prophylaxis:  [place]  Lovenox   Procedures: As Listed in Progress Note Above  Antibiotics: None    Subjective:   Objective: Vitals:   12/23/17 2109 12/24/17 0230 12/24/17 0700 12/24/17 1112  BP:   (!) 175/81   Pulse:   69   Resp:   18   Temp:   97.6 F (36.4 C)   TempSrc:   Oral   SpO2: 93% 93% 97% 92%  Weight:   (!) 181.1 kg (399 lb 4.1 oz)   Height:        Intake/Output Summary (Last 24 hours) at 12/24/2017 1241 Last data filed at 12/24/2017 1154 Gross per 24 hour  Intake 243 ml  Output 3300 ml  Net -3057 ml   Weight change:  Exam:   General:  Pt is alert, follows commands appropriately, not in acute distress  HEENT: No icterus, No thrush, No neck mass, [place]/AT  Cardiovascular: RRR, S1/S2, no rubs, no gallops  Respiratory: CTA bilaterally, no wheezing, no crackles, no rhonchi  Abdomen: Soft/+BS, non tender, non distended, no guarding  Extremities: No edema, No lymphangitis, No petechiae, No rashes, no synovitis   Data Reviewed: I have personally reviewed following labs and imaging studies Basic Metabolic Panel: Recent Labs  Lab 12/23/17 0831 12/24/17 0611  NA 142 141  K 4.3 4.2  CL 99* 96*  CO2 33* 34*  GLUCOSE 97 133*  BUN 12 15  CREATININE 1.11* 1.04*  CALCIUM 8.9 9.2   Liver Function Tests: Recent Labs  Lab 12/23/17 0831  AST 13*  ALT 17  ALKPHOS 61  BILITOT 0.5  PROT 7.3  ALBUMIN 3.5   No results for input(s): LIPASE, AMYLASE in the last 168 hours. No results for input(s): AMMONIA in the last  168 hours. Coagulation Profile: No results for input(s): INR, PROTIME in the last 168 hours. CBC: Recent Labs  Lab 12/23/17 0831  WBC 6.6  NEUTROABS 3.8  HGB 11.9*  HCT 42.1  MCV 94.4  PLT 220   Cardiac Enzymes: Recent Labs  Lab 12/23/17 0831  TROPONINI <0.03   BNP: Invalid input(s): POCBNP CBG: No results for input(s): GLUCAP in the last 168 hours. HbA1C: No results for input(s): HGBA1C in the last 72 hours. Urine analysis:    Component Value  Date/Time   COLORURINE YELLOW 05/18/2016 1308   APPEARANCEUR CLEAR 05/18/2016 1308   LABSPEC 1.010 05/18/2016 1308   PHURINE 8.5 (H) 05/18/2016 1308   GLUCOSEU NEGATIVE 05/18/2016 1308   HGBUR LARGE (A) 05/18/2016 1308   BILIRUBINUR NEGATIVE 05/18/2016 1308   KETONESUR TRACE (A) 05/18/2016 1308   PROTEINUR 30 (A) 05/18/2016 1308   NITRITE NEGATIVE 05/18/2016 1308   LEUKOCYTESUR NEGATIVE 05/18/2016 1308   Sepsis Labs: @LABRCNTIP (procalcitonin:4,lacticidven:4) )No results found for this or any previous visit (from the past 240 hour(s)).   Scheduled Meds: . amLODipine  10 mg Oral Daily  . enoxaparin (LOVENOX) injection  80 mg Subcutaneous Q24H  . furosemide  40 mg Intravenous BID  . ipratropium-albuterol  3 mL Nebulization Q6H  . lisinopril  2.5 mg Oral Daily  . pantoprazole  40 mg Oral Daily  . sertraline  100 mg Oral Daily  . sodium chloride flush  3 mL Intravenous Q12H   Continuous Infusions: . sodium chloride      Procedures/Studies: Dg Chest 2 View  Result Date: 12/23/2017 CLINICAL DATA:  Shortness of breath. EXAM: CHEST  2 VIEW COMPARISON:  Radiographs of September 09, 2016. FINDINGS: The heart size and mediastinal contours are within normal limits. Both lungs are clear. No pneumothorax or pleural effusion is noted. The visualized skeletal structures are unremarkable. IMPRESSION: No active cardiopulmonary disease. Electronically Signed   By: Lupita Raider, M.D.   On: 12/23/2017 09:16   US Venous Img Lower Bilateral  Result Date: 12/23/2017 CLINICAL DATA:  Bilateral lower extremity pain and edema for the past 4 days. History of pulmonary embolism. Evaluate for DVT. EXAM: BILATERAL LOWER EXTREMITY VENOUS DOPPLER ULTRASOUND TECHNIQUE: Gray-scale sonography with graded compression, as well as color Doppler and duplex ultrasound were performed to evaluate the lower extremity deep venous systems from the level of the common femoral vein and including the common femoral, femoral,  profunda femoral, popliteal and calf veins including the posterior tibial, peroneal and gastrocnemius veins when visible. The superficial great saphenous vein was also interrogated. Spectral Doppler was utilized to evaluate flow at rest and with distal augmentation maneuvers in the common femoral, femoral and popliteal veins. COMPARISON:  None. FINDINGS: RIGHT LOWER EXTREMITY Common Femoral Vein: No evidence of thrombus. Normal compressibility, respiratory phasicity and response to augmentation. Saphenofemoral Junction: No evidence of thrombus. Normal compressibility and flow on color Doppler imaging. Profunda Femoral Vein: No evidence of thrombus. Normal compressibility and flow on color Doppler imaging. Femoral Vein: No evidence of thrombus. Normal compressibility, respiratory phasicity and response to augmentation. Popliteal Vein: No evidence of thrombus. Normal compressibility, respiratory phasicity and response to augmentation. Calf Veins: No evidence of thrombus. Normal compressibility and flow on color Doppler imaging. Superficial Great Saphenous Vein: No evidence of thrombus. Normal compressibility. Venous Reflux:  None. Other Findings:  None. LEFT LOWER EXTREMITY Common Femoral Vein: No evidence of thrombus. Normal compressibility, respiratory phasicity and response to augmentation. Saphenofemoral Junction: No evidence of thrombus. Normal compressibility and flow  on color Doppler imaging. Profunda Femoral Vein: No evidence of thrombus. Normal compressibility and flow on color Doppler imaging. Femoral Vein: No evidence of thrombus. Normal compressibility, respiratory phasicity and response to augmentation. Popliteal Vein: No evidence of thrombus. Normal compressibility, respiratory phasicity and response to augmentation. Calf Veins: No evidence of thrombus. Normal compressibility and flow on color Doppler imaging. Superficial Great Saphenous Vein: No evidence of thrombus. Normal compressibility. Venous  Reflux:  None. Other Findings:  None. IMPRESSION: No evidence of DVT within either lower extremity. Electronically Signed   By: Simonne Come M.D.   On: 12/23/2017 17:09    Catarina Hartshorn, DO  [facility] Pager [phone]  If 7PM-7AM, please contact night-coverage [email or web address] Password [email or web address] 12/24/2017, 12:41 PM   LOS: 1 day

## 2017-12-24 NOTE — Progress Notes (Signed)
*  PRELIMINARY RESULTS* Echocardiogram 2D Echocardiogram has been performed.  Ann Giles, Ann Giles 12/24/2017, 1:35 PM

## 2017-12-25 LAB — BASIC METABOLIC PANEL
ANION GAP: 12 (ref 5–15)
BUN: 20 mg/dL (ref 6–20)
CALCIUM: 9.3 mg/dL (ref 8.9–10.3)
CO2: 37 mmol/L — ABNORMAL HIGH (ref 22–32)
Chloride: 96 mmol/L — ABNORMAL LOW (ref 101–111)
Creatinine, Ser: 1.02 mg/dL — ABNORMAL HIGH (ref 0.44–1.00)
Glucose, Bld: 89 mg/dL (ref 65–99)
POTASSIUM: 3.8 mmol/L (ref 3.5–5.1)
Sodium: 145 mmol/L (ref 135–145)

## 2017-12-25 MED ORDER — FUROSEMIDE 40 MG PO TABS
40.0000 mg | ORAL_TABLET | Freq: Every day | ORAL | Status: DC
  Administered 2017-12-26: 40 mg via ORAL
  Filled 2017-12-25: qty 1

## 2017-12-25 NOTE — Progress Notes (Signed)
PROGRESS NOTE  Ann Giles ONG:[id number] DOB: Jun 26, 1971 DOA: 12/23/2017 PCP: Patient, No Pcp Per  Brief History:  47 y.o.femalewith medical history ofdepression, hypertension, GERD, and bronchospasmpresents with 4-day history of worsening shortness of breath and nonproductive cough. The patient has been using her albuterol inhaler without much relief. She has been complaining of increasing lower extremity edema and orthopnea for the past week. She has had to sleep sitting up in a recliner. She denies any recent sick contacts or travels. She denies any nausea, vomiting, diarrhea, abdominal pain, dysuria, hematuria. In the emergency department, the patient had oxygen saturation 88-90% on room air. She was afebrile hemodynamically stable. BMP, CBC, LFTs were unremarkable. Chest x-ray did not show any infiltrates or pulmonary edema, but had vascular congestion. EKG shows sinus rhythm with nonspecific T wave changes.    Assessment/Plan: Acute respiratory failure with hypoxia -Presently stable on 2 L -Multifactorial including CHF, bronchospasm and underlying OSA  Acute diastolic CHF -The patient appears clinically volume overloaded -up 20+ lbs from 1 year ago -suspect this is mostly right sided CHF from OSA/OHS-->endorsed noncompliance with CPAP at home -continue IV furosemide twice daily -Daily weights--NEG 9 lbs -Accurate I's and O's--NEG 5.2 L -Echocardiogram--EF 65-70%, indeterminant diastolic dysfunction, no WMA  Bronchospasm -10/16/2017PFTs--showed diffusion defect and interstitial process without airflow obstruction. There was no bronchodilator response -continue BDs  Essential hypertension -Continue amlodipine and lisinopril -Anticipate improvement with diuresis  Depression -Continue Zoloft  Lower Extremity Edema and pain -venous duplex---neg DVT  Morbid Obesity -BMI 56.5  Disposition Plan:   Home 1/7 if stable Family  Communication:  Mother updated at bedside  Consultants:  none  Code Status:  FULL  DVT Prophylaxis:  [place] Lovenox   Procedures: As Listed in Progress Note Above  Antibiotics: None     Subjective: Patient denies fevers, chills, headache, chest pain, dyspnea, nausea, vomiting, diarrhea, abdominal pain, dysuria, hematuria, hematochezia, and melena.   Objective: Vitals:   12/25/17 0216 12/25/17 0425 12/25/17 0500 12/25/17 0931  BP:  135/81    Pulse: 60 60    Resp: 16 20    Temp:  97.7 F (36.5 C)    TempSrc:  Axillary    SpO2: 98% 97%    Weight:   (!) 182.6 kg (402 lb 9 oz) (!) 177 kg (390 lb 3.4 oz)  Height:        Intake/Output Summary (Last 24 hours) at 12/25/2017 1414 Last data filed at 12/25/2017 1305 Gross per 24 hour  Intake 480 ml  Output 2900 ml  Net -2420 ml   Weight change: 13.9 kg (30 lb 9 oz) Exam:   General:  Pt is alert, follows commands appropriately, not in acute distress  HEENT: No icterus, No thrush, No neck mass, [place]/AT  Cardiovascular: RRR, S1/S2, no rubs, no gallops  Respiratory: bibasilar rales, no wheeze  Abdomen: Soft/+BS, non tender, non distended, no guarding  Extremities: 1+LE edema, No lymphangitis, No petechiae, No rashes, no synovitis   Data Reviewed: I have personally reviewed following labs and imaging studies Basic Metabolic Panel: Recent Labs  Lab 12/23/17 0831 12/24/17 0611 12/25/17 0740  NA 142 141 145  K 4.3 4.2 3.8  CL 99* 96* 96*  CO2 33* 34* 37*  GLUCOSE 97 133* 89  BUN 12 15 20   CREATININE 1.11* 1.04* 1.02*  CALCIUM 8.9 9.2 9.3   Liver Function Tests: Recent Labs  Lab 12/23/17 0831  AST 13*  ALT 17  ALKPHOS 61  BILITOT 0.5  PROT 7.3  ALBUMIN 3.5   No results for input(s): LIPASE, AMYLASE in the last 168 hours. No results for input(s): AMMONIA in the last 168 hours. Coagulation Profile: No results for input(s): INR, PROTIME in the last 168 hours. CBC: Recent Labs  Lab 12/23/17 0831  WBC  6.6  NEUTROABS 3.8  HGB 11.9*  HCT 42.1  MCV 94.4  PLT 220   Cardiac Enzymes: Recent Labs  Lab 12/23/17 0831  TROPONINI <0.03   BNP: Invalid input(s): POCBNP CBG: No results for input(s): GLUCAP in the last 168 hours. HbA1C: No results for input(s): HGBA1C in the last 72 hours. Urine analysis:    Component Value Date/Time   COLORURINE YELLOW 05/18/2016 1308   APPEARANCEUR CLEAR 05/18/2016 1308   LABSPEC 1.010 05/18/2016 1308   PHURINE 8.5 (H) 05/18/2016 1308   GLUCOSEU NEGATIVE 05/18/2016 1308   HGBUR LARGE (A) 05/18/2016 1308   BILIRUBINUR NEGATIVE 05/18/2016 1308   KETONESUR TRACE (A) 05/18/2016 1308   PROTEINUR 30 (A) 05/18/2016 1308   NITRITE NEGATIVE 05/18/2016 1308   LEUKOCYTESUR NEGATIVE 05/18/2016 1308   Sepsis Labs: @LABRCNTIP (procalcitonin:4,lacticidven:4) ) Recent Results (from the past 240 hour(s))  Respiratory Panel by PCR     Status: None   Collection Time: 12/23/17  6:24 PM  Result Value Ref Range Status   Adenovirus NOT DETECTED NOT DETECTED Final   Coronavirus 229E NOT DETECTED NOT DETECTED Final   Coronavirus HKU1 NOT DETECTED NOT DETECTED Final   Coronavirus NL63 NOT DETECTED NOT DETECTED Final   Coronavirus OC43 NOT DETECTED NOT DETECTED Final   Metapneumovirus NOT DETECTED NOT DETECTED Final   Rhinovirus / Enterovirus NOT DETECTED NOT DETECTED Final   Influenza A NOT DETECTED NOT DETECTED Final   Influenza B NOT DETECTED NOT DETECTED Final   Parainfluenza Virus 1 NOT DETECTED NOT DETECTED Final   Parainfluenza Virus 2 NOT DETECTED NOT DETECTED Final   Parainfluenza Virus 3 NOT DETECTED NOT DETECTED Final   Parainfluenza Virus 4 NOT DETECTED NOT DETECTED Final   Respiratory Syncytial Virus NOT DETECTED NOT DETECTED Final   Bordetella pertussis NOT DETECTED NOT DETECTED Final   Chlamydophila pneumoniae NOT DETECTED NOT DETECTED Final   Mycoplasma pneumoniae NOT DETECTED NOT DETECTED Final    Comment: Performed at [facility],  [address] [place], [place] [address]     Scheduled Meds: . amLODipine  10 mg Oral Daily  . enoxaparin (LOVENOX) injection  80 mg Subcutaneous Q24H  . furosemide  40 mg Intravenous BID  . ipratropium-albuterol  3 mL Nebulization Q6H  . lisinopril  2.5 mg Oral Daily  . pantoprazole  40 mg Oral Daily  . sertraline  100 mg Oral Daily  . sodium chloride flush  3 mL Intravenous Q12H   Continuous Infusions: . sodium chloride      Procedures/Studies: Dg Chest 2 View  Result Date: 12/23/2017 CLINICAL DATA:  Shortness of breath. EXAM: CHEST  2 VIEW COMPARISON:  Radiographs of September 09, 2016. FINDINGS: The heart size and mediastinal contours are within normal limits. Both lungs are clear. No pneumothorax or pleural effusion is noted. The visualized skeletal structures are unremarkable. IMPRESSION: No active cardiopulmonary disease. Electronically Signed   By: Lupita RaiderJames  Green Jr, M.D.   On: 12/23/2017 09:16   [place] Venous Img Lower Bilateral  Result Date: 12/23/2017 CLINICAL DATA:  Bilateral lower extremity pain and edema for the past 4 days. History of pulmonary embolism. Evaluate for DVT. EXAM: BILATERAL LOWER  EXTREMITY VENOUS DOPPLER ULTRASOUND TECHNIQUE: Gray-scale sonography with graded compression, as well as color Doppler and duplex ultrasound were performed to evaluate the lower extremity deep venous systems from the level of the common femoral vein and including the common femoral, femoral, profunda femoral, popliteal and calf veins including the posterior tibial, peroneal and gastrocnemius veins when visible. The superficial great saphenous vein was also interrogated. Spectral Doppler was utilized to evaluate flow at rest and with distal augmentation maneuvers in the common femoral, femoral and popliteal veins. COMPARISON:  None. FINDINGS: RIGHT LOWER EXTREMITY Common Femoral Vein: No evidence of thrombus. Normal compressibility, respiratory phasicity and response to augmentation. Saphenofemoral  Junction: No evidence of thrombus. Normal compressibility and flow on color Doppler imaging. Profunda Femoral Vein: No evidence of thrombus. Normal compressibility and flow on color Doppler imaging. Femoral Vein: No evidence of thrombus. Normal compressibility, respiratory phasicity and response to augmentation. Popliteal Vein: No evidence of thrombus. Normal compressibility, respiratory phasicity and response to augmentation. Calf Veins: No evidence of thrombus. Normal compressibility and flow on color Doppler imaging. Superficial Great Saphenous Vein: No evidence of thrombus. Normal compressibility. Venous Reflux:  None. Other Findings:  None. LEFT LOWER EXTREMITY Common Femoral Vein: No evidence of thrombus. Normal compressibility, respiratory phasicity and response to augmentation. Saphenofemoral Junction: No evidence of thrombus. Normal compressibility and flow on color Doppler imaging. Profunda Femoral Vein: No evidence of thrombus. Normal compressibility and flow on color Doppler imaging. Femoral Vein: No evidence of thrombus. Normal compressibility, respiratory phasicity and response to augmentation. Popliteal Vein: No evidence of thrombus. Normal compressibility, respiratory phasicity and response to augmentation. Calf Veins: No evidence of thrombus. Normal compressibility and flow on color Doppler imaging. Superficial Great Saphenous Vein: No evidence of thrombus. Normal compressibility. Venous Reflux:  None. Other Findings:  None. IMPRESSION: No evidence of DVT within either lower extremity. Electronically Signed   By: Simonne Come M.D.   On: 12/23/2017 17:09    Catarina Hartshorn, DO  [facility] Pager [phone]  If 7PM-7AM, please contact night-coverage [email or web address] Password [email or web address] 12/25/2017, 2:14 PM   LOS: 2 days

## 2017-12-25 NOTE — Discharge Summary (Signed)
Physician Discharge Summary  Ann Giles ZOX:[id number] DOB: 1971/09/24 DOA: 12/23/2017  PCP: Patient, No Pcp Per  Admit date: 12/23/2017 Discharge date: 12/26/2017  Admitted From: Home Disposition:  Home   Recommendations for Outpatient Follow-up:  1. Follow up with PCP in 1-2 weeks 2. Please obtain BMP/CBC in one week    Discharge Condition: Stable CODE STATUS: FULL Diet recommendation: Heart Healthy   Brief/Interim Summary: 47 y.o.femalewith medical history ofdepression, hypertension, GERD, and bronchospasmpresents with 4-day history of worsening shortness of breath and nonproductive cough. The patient has been using her albuterol inhaler without much relief. She has been complaining of increasing lower extremity edema and orthopnea for the past week. She has had to sleep sitting up in a recliner. She denies any recent sick contacts or travels. She denies any nausea, vomiting, diarrhea, abdominal pain, dysuria, hematuria. In the emergency department, the patient had oxygen saturation 88-90% on room air. She was afebrile hemodynamically stable. BMP, CBC, LFTs were unremarkable. Chest x-ray did not show any infiltrates or pulmonary edema, but had vascular congestion. EKG shows sinus rhythm with nonspecific T wave changes.    Discharge Diagnoses:  Acute respiratory failure with hypoxia -Presently stable on 2 L -Multifactorial including CHF, bronchospasm and underlying OSA/OHS -ambulatory pulse ox showed desaturation to 88%-->home with oxygen--2L  Acute diastolic CHF -The patient appears clinically volume overloaded -up 20+ lbs from 1 year ago -suspect this is mostly right sided CHF from OSA/OHS-->endorsed noncompliance with CPAP at home -continueIV furosemide twice daily>>>home with lasix 40 mg po daily -Daily weights--NEG 18 lbs -discharge weight 382 -Accurate I's and O's--NEG 7.1 L although no completely accurate -Echocardiogram--EF 65-70%, indeterminant  diastolic dysfunction, no WMA  Bronchospasm -10/16/2017PFTs--showed diffusion defect and interstitial process without airflow obstruction. There was no bronchodilator response -continue BDs -improved with diuresis  OSA -not compliant with CPAP at home -CPAP during hospitalization  Essential hypertension -Continue amlodipineand lisinopril  Depression -Continue Zoloft  Lower Extremity Edema and pain -venous duplex---neg DVT  Morbid Obesity -BMI 56.5     Discharge Instructions   Allergies as of 12/26/2017   No Known Allergies     Medication List    TAKE these medications   albuterol 108 (90 Base) MCG/ACT inhaler Commonly known as:  PROVENTIL HFA;VENTOLIN HFA Inhale 2 puffs into the lungs every 6 (six) hours as needed for wheezing or shortness of breath.   amLODipine 10 MG tablet Commonly known as:  NORVASC Take 1 tablet (10 mg total) by mouth daily.   fluticasone 220 MCG/ACT inhaler Commonly known as:  FLOVENT HFA Inhale 2 puffs into the lungs 2 (two) times daily.   furosemide 40 MG tablet Commonly known as:  LASIX Take 1 tablet (40 mg total) by mouth daily. Start taking on:  12/27/2017   ibuprofen 200 MG tablet Commonly known as:  ADVIL,MOTRIN Take 600 mg by mouth every 6 (six) hours as needed for mild pain or moderate pain.   lisinopril 2.5 MG tablet Commonly known as:  PRINIVIL,ZESTRIL Take 1 tablet (2.5 mg total) by mouth daily. Start taking on:  12/27/2017   omeprazole 20 MG capsule Commonly known as:  PRILOSEC Take 20 mg by mouth daily as needed (for acid reflux).   sertraline 100 MG tablet Commonly known as:  ZOLOFT Take 100 mg by mouth daily.            Durable Medical Equipment  (From admission, onward)        Start     Ordered   12/26/17  1512  For home use only DME oxygen  Once    Comments:  Please assess pt for port concentrator and for discharge home deliver port oxygen tank delivered to hospital  Question Answer Comment    Mode or (Route) Nasal cannula   Liters per Minute 2   Frequency Continuous (stationary and portable oxygen unit needed)   Oxygen delivery system Gas      12/26/17 1513      No Known Allergies  Consultations:  None   Procedures/Studies: Dg Chest 2 View  Result Date: 12/23/2017 CLINICAL DATA:  Shortness of breath. EXAM: CHEST  2 VIEW COMPARISON:  Radiographs of September 09, 2016. FINDINGS: The heart size and mediastinal contours are within normal limits. Both lungs are clear. No pneumothorax or pleural effusion is noted. The visualized skeletal structures are unremarkable. IMPRESSION: No active cardiopulmonary disease. Electronically Signed   By: Lupita Raider, M.D.   On: 12/23/2017 09:16   US Venous Img Lower Bilateral  Result Date: 12/23/2017 CLINICAL DATA:  Bilateral lower extremity pain and edema for the past 4 days. History of pulmonary embolism. Evaluate for DVT. EXAM: BILATERAL LOWER EXTREMITY VENOUS DOPPLER ULTRASOUND TECHNIQUE: Gray-scale sonography with graded compression, as well as color Doppler and duplex ultrasound were performed to evaluate the lower extremity deep venous systems from the level of the common femoral vein and including the common femoral, femoral, profunda femoral, popliteal and calf veins including the posterior tibial, peroneal and gastrocnemius veins when visible. The superficial great saphenous vein was also interrogated. Spectral Doppler was utilized to evaluate flow at rest and with distal augmentation maneuvers in the common femoral, femoral and popliteal veins. COMPARISON:  None. FINDINGS: RIGHT LOWER EXTREMITY Common Femoral Vein: No evidence of thrombus. Normal compressibility, respiratory phasicity and response to augmentation. Saphenofemoral Junction: No evidence of thrombus. Normal compressibility and flow on color Doppler imaging. Profunda Femoral Vein: No evidence of thrombus. Normal compressibility and flow on color Doppler imaging. Femoral Vein:  No evidence of thrombus. Normal compressibility, respiratory phasicity and response to augmentation. Popliteal Vein: No evidence of thrombus. Normal compressibility, respiratory phasicity and response to augmentation. Calf Veins: No evidence of thrombus. Normal compressibility and flow on color Doppler imaging. Superficial Great Saphenous Vein: No evidence of thrombus. Normal compressibility. Venous Reflux:  None. Other Findings:  None. LEFT LOWER EXTREMITY Common Femoral Vein: No evidence of thrombus. Normal compressibility, respiratory phasicity and response to augmentation. Saphenofemoral Junction: No evidence of thrombus. Normal compressibility and flow on color Doppler imaging. Profunda Femoral Vein: No evidence of thrombus. Normal compressibility and flow on color Doppler imaging. Femoral Vein: No evidence of thrombus. Normal compressibility, respiratory phasicity and response to augmentation. Popliteal Vein: No evidence of thrombus. Normal compressibility, respiratory phasicity and response to augmentation. Calf Veins: No evidence of thrombus. Normal compressibility and flow on color Doppler imaging. Superficial Great Saphenous Vein: No evidence of thrombus. Normal compressibility. Venous Reflux:  None. Other Findings:  None. IMPRESSION: No evidence of DVT within either lower extremity. Electronically Signed   By: Simonne Come M.D.   On: 12/23/2017 17:09        Discharge Exam: Vitals:   12/26/17 1500 12/26/17 1533  BP: 137/69   Pulse: 97   Resp: (!) 21   Temp: 98.7 F (37.1 C)   SpO2: 97% 90%   Vitals:   12/26/17 1420 12/26/17 1425 12/26/17 1500 12/26/17 1533  BP:   137/69   Pulse:   97   Resp:   (!) 21  Temp:   98.7 F (37.1 C)   TempSrc:   Oral   SpO2: (!) 88% 97% 97% 90%  Weight:      Height:        General: Pt is alert, awake, not in acute distress Cardiovascular: RRR, S1/S2 +, no rubs, no gallops Respiratory: CTA bilaterally, no wheezing, no rhonchi Abdominal: Soft, NT,  ND, bowel sounds + Extremities: no edema, no cyanosis   The results of significant diagnostics from this hospitalization (including imaging, microbiology, ancillary and laboratory) are listed below for reference.    Significant Diagnostic Studies: Dg Chest 2 View  Result Date: 12/23/2017 CLINICAL DATA:  Shortness of breath. EXAM: CHEST  2 VIEW COMPARISON:  Radiographs of September 09, 2016. FINDINGS: The heart size and mediastinal contours are within normal limits. Both lungs are clear. No pneumothorax or pleural effusion is noted. The visualized skeletal structures are unremarkable. IMPRESSION: No active cardiopulmonary disease. Electronically Signed   By: Lupita RaiderJames  Green Jr, M.D.   On: 12/23/2017 09:16   [place] Venous Img Lower Bilateral  Result Date: 12/23/2017 CLINICAL DATA:  Bilateral lower extremity pain and edema for the past 4 days. History of pulmonary embolism. Evaluate for DVT. EXAM: BILATERAL LOWER EXTREMITY VENOUS DOPPLER ULTRASOUND TECHNIQUE: Gray-scale sonography with graded compression, as well as color Doppler and duplex ultrasound were performed to evaluate the lower extremity deep venous systems from the level of the common femoral vein and including the common femoral, femoral, profunda femoral, popliteal and calf veins including the posterior tibial, peroneal and gastrocnemius veins when visible. The superficial great saphenous vein was also interrogated. Spectral Doppler was utilized to evaluate flow at rest and with distal augmentation maneuvers in the common femoral, femoral and popliteal veins. COMPARISON:  None. FINDINGS: RIGHT LOWER EXTREMITY Common Femoral Vein: No evidence of thrombus. Normal compressibility, respiratory phasicity and response to augmentation. Saphenofemoral Junction: No evidence of thrombus. Normal compressibility and flow on color Doppler imaging. Profunda Femoral Vein: No evidence of thrombus. Normal compressibility and flow on color Doppler imaging. Femoral Vein:  No evidence of thrombus. Normal compressibility, respiratory phasicity and response to augmentation. Popliteal Vein: No evidence of thrombus. Normal compressibility, respiratory phasicity and response to augmentation. Calf Veins: No evidence of thrombus. Normal compressibility and flow on color Doppler imaging. Superficial Great Saphenous Vein: No evidence of thrombus. Normal compressibility. Venous Reflux:  None. Other Findings:  None. LEFT LOWER EXTREMITY Common Femoral Vein: No evidence of thrombus. Normal compressibility, respiratory phasicity and response to augmentation. Saphenofemoral Junction: No evidence of thrombus. Normal compressibility and flow on color Doppler imaging. Profunda Femoral Vein: No evidence of thrombus. Normal compressibility and flow on color Doppler imaging. Femoral Vein: No evidence of thrombus. Normal compressibility, respiratory phasicity and response to augmentation. Popliteal Vein: No evidence of thrombus. Normal compressibility, respiratory phasicity and response to augmentation. Calf Veins: No evidence of thrombus. Normal compressibility and flow on color Doppler imaging. Superficial Great Saphenous Vein: No evidence of thrombus. Normal compressibility. Venous Reflux:  None. Other Findings:  None. IMPRESSION: No evidence of DVT within either lower extremity. Electronically Signed   By: Simonne ComeJohn  Watts M.D.   On: 12/23/2017 17:09     Microbiology: Recent Results (from the past 240 hour(s))  Respiratory Panel by PCR     Status: None   Collection Time: 12/23/17  6:24 PM  Result Value Ref Range Status   Adenovirus NOT DETECTED NOT DETECTED Final   Coronavirus 229E NOT DETECTED NOT DETECTED Final   Coronavirus HKU1 NOT DETECTED NOT  DETECTED Final   Coronavirus NL63 NOT DETECTED NOT DETECTED Final   Coronavirus OC43 NOT DETECTED NOT DETECTED Final   Metapneumovirus NOT DETECTED NOT DETECTED Final   Rhinovirus / Enterovirus NOT DETECTED NOT DETECTED Final   Influenza A NOT  DETECTED NOT DETECTED Final   Influenza B NOT DETECTED NOT DETECTED Final   Parainfluenza Virus 1 NOT DETECTED NOT DETECTED Final   Parainfluenza Virus 2 NOT DETECTED NOT DETECTED Final   Parainfluenza Virus 3 NOT DETECTED NOT DETECTED Final   Parainfluenza Virus 4 NOT DETECTED NOT DETECTED Final   Respiratory Syncytial Virus NOT DETECTED NOT DETECTED Final   Bordetella pertussis NOT DETECTED NOT DETECTED Final   Chlamydophila pneumoniae NOT DETECTED NOT DETECTED Final   Mycoplasma pneumoniae NOT DETECTED NOT DETECTED Final    Comment: Performed at [facility], [address] [place], [place] [address]     Labs: Basic Metabolic Panel: Recent Labs  Lab 12/23/17 0831 12/24/17 0611 12/25/17 0740 12/26/17 0637  NA 142 141 145 141  K 4.3 4.2 3.8 3.5  CL 99* 96* 96* 92*  CO2 33* 34* 37* 37*  GLUCOSE 97 133* 89 89  BUN 12 15 20 19   CREATININE 1.11* 1.04* 1.02* 1.01*  CALCIUM 8.9 9.2 9.3 9.3   Liver Function Tests: Recent Labs  Lab 12/23/17 0831  AST 13*  ALT 17  ALKPHOS 61  BILITOT 0.5  PROT 7.3  ALBUMIN 3.5   No results for input(s): LIPASE, AMYLASE in the last 168 hours. No results for input(s): AMMONIA in the last 168 hours. CBC: Recent Labs  Lab 12/23/17 0831  WBC 6.6  NEUTROABS 3.8  HGB 11.9*  HCT 42.1  MCV 94.4  PLT 220   Cardiac Enzymes: Recent Labs  Lab 12/23/17 0831  TROPONINI <0.03   BNP: Invalid input(s): POCBNP CBG: No results for input(s): GLUCAP in the last 168 hours.  Time coordinating discharge:  Greater than 30 minutes  Signed:  Catarina Hartshorn, DO [facility] Pager: [phone] 12/26/2017, 7:18 PM

## 2017-12-26 LAB — BASIC METABOLIC PANEL
ANION GAP: 12 (ref 5–15)
BUN: 19 mg/dL (ref 6–20)
CALCIUM: 9.3 mg/dL (ref 8.9–10.3)
CHLORIDE: 92 mmol/L — AB (ref 101–111)
CO2: 37 mmol/L — AB (ref 22–32)
Creatinine, Ser: 1.01 mg/dL — ABNORMAL HIGH (ref 0.44–1.00)
GFR calc non Af Amer: >60 mL/min (ref >60)
GLUCOSE: 89 mg/dL (ref 65–99)
POTASSIUM: 3.5 mmol/L (ref 3.5–5.1)
Sodium: 141 mmol/L (ref 135–145)

## 2017-12-26 MED ORDER — AMLODIPINE BESYLATE 10 MG PO TABS: 10.0000 mg | ORAL_TABLET | Freq: Every day | ORAL | 1 refills | Status: DC

## 2017-12-26 MED ORDER — LISINOPRIL 2.5 MG PO TABS: 2.5000 mg | ORAL_TABLET | Freq: Every day | ORAL | 1 refills | Status: DC

## 2017-12-26 MED ORDER — IPRATROPIUM-ALBUTEROL 0.5-2.5 (3) MG/3ML IN SOLN
3.0000 mL | Freq: Four times a day (QID) | RESPIRATORY_TRACT | Status: DC
  Administered 2017-12-26 (×2): 3 mL via RESPIRATORY_TRACT
  Filled 2017-12-26 (×2): qty 3

## 2017-12-26 MED ORDER — FUROSEMIDE 40 MG PO TABS: 40.0000 mg | ORAL_TABLET | Freq: Every day | ORAL | 1 refills | Status: DC

## 2017-12-26 NOTE — Care Management Note (Signed)
Case Management Note  Patient Details  Name: Ann Giles MRN: [id number] Date of Birth: 1971-10-05        Admitted with  CHF. She is from home, ind with ADL's. Pt works and is not homebound. She [facility] and [facility] not [facility], was unable to get port concentrator and would prefer to use another DME provider. She has chosen [facility]. Referral info has been faxed and pt will have port tank delivered to room prior to DC.  Pt communicates no further needs or concerns about DC. She has PCP Kathlen Brunswick(Marsha White) for follow up and will be able to obtain medications.           Expected Discharge Date:  12/26/17               Expected Discharge Plan:  Home/Self Care  In-House Referral:  NA  Discharge planning Services  CM Consult  Post Acute Care Choice:  Durable Medical Equipment Choice offered to:  Patient  DME Arranged:  Oxygen DME Agency:  [facility]  Status of Service:  Completed, signed off  Malcolm MetroChildress, Kashlyn Salinas Demske, RN 12/26/2017, 3:18 PM

## 2017-12-26 NOTE — Care Management Important Message (Signed)
Important Message  Patient Details  Name: Valetta FullerShoron L Collington MRN: [id number] Date of Birth: March 12, 1971   Medicare Important Message Given:  Yes    Malcolm MetroChildress, Maribel Hadley Demske, RN 12/26/2017, 3:40 PM

## 2017-12-26 NOTE — Care Management (Signed)
Patient Information   Patient Name Ann Giles, Ann Giles ([id number]) Sex Female DOB September 25, 1971  Room Bed  A341 A341-01  Patient Demographics   Address [address] [place] [address] Phone [phone] [facility](Home) [phone] (Mobile)  Patient Ethnicity & Race   Ethnic Group Patient Race  Not Hispanic or Latino Black or African American  Emergency Contact(s)   Name Relation Home Work Mobile  Peatross,Geraldine Mother [phone]    Documents on File    Status Date Received Description  Documents for the Patient  [place] HIPAA NOTICE OF PRIVACY - Scanned Not Received    [place] E-Signature HIPAA Notice of Privacy Received 09/25/14   Driver's License Received 09/25/14 Drivers License/dhl/aped  Insurance Card Received 09/25/14 tricare/dhl/aped  Advance Directives/Living Will/HCPOA/POA Not Received    Other Photo ID Not Received    Insurance Card   [facility] 2017  Patient Photo   Photo of Patient  Documents for the Encounter  Cardiac Monitoring Strip Shift Summary Received 12/23/17   AOB (Assignment of Insurance Benefits) Not Received    E-signature AOB Signed 12/23/17   MEDICARE RIGHTS Not Received    E-signature Medicare Rights     ED Patient Billing Extract   ED PB Billing Extract  Ultrasound Received 12/23/17   Cardiac Monitoring Strip Received 12/23/17   Authorization Request Received 12/26/17 PENDING Reather LaurenceUTH, INPT 1.4.2019  EKG Received 12/26/17   Admission Information   Attending Provider Admitting Provider Admission Type Admission Date/Time  Tat, Onalee Huaavid, MD Catarina Hartshornat, David, MD Emergency 12/23/17 0745  Discharge Date Hospital Service Auth/Cert Status Service Area   Internal Medicine Incomplete [place] SERVICE AREA  Unit Room/Bed Admission Status   AP-DEPT 300 A341/A341-01 Admission (Confirmed)   Admission   Complaint  SOB;Chest Heaviness;Chest Tightness  Hospital Account   Name Acct ID Class Status Primary Coverage  Ann Giles, Ann Giles [id number]  Inpatient Open TRICARE - TRICARE      Guarantor Account (for Hospital Account [id number])   Name Relation to Pt Service Area Active? Acct Type  Ann Giles, Ann Giles Self [facility]SA Yes Personal/Family  Address Phone    [address] [place] [address] [phone](H)        Coverage Information (for Hospital Account [id number])   F/O Payor/Plan Precert #  TRICARE/TRICARE   Subscriber Subscriber #  Ann Giles, Ann Giles [id number]  Address Phone  [address] [place], [place] [id number] [phone]

## 2017-12-26 NOTE — Care Management (Signed)
CM contacted by [facility] who is unable to take Tricare (as previously thought). Pt's second preference is [facility], CM has verified with rep, Ashly, that they are able to accept Tricare. Referral has been faxed with instructions to deliver port tank to hospital for DC home. Pt is aware port tank may be delivered late this evening. She is okay with this. DC home today is still planned.

## 2017-12-26 NOTE — Progress Notes (Signed)
Ann Giles discharged Home per MD order.  Discharge instructions reviewed and discussed with the patient, all questions and concerns answered. Copy of instructions and scripts given to patient.  Allergies as of 12/26/2017   No Known Allergies     Medication List    TAKE these medications   albuterol 108 (90 Base) MCG/ACT inhaler Commonly known as:  PROVENTIL HFA;VENTOLIN HFA Inhale 2 puffs into the lungs every 6 (six) hours as needed for wheezing or shortness of breath.   amLODipine 10 MG tablet Commonly known as:  NORVASC Take 1 tablet (10 mg total) by mouth daily.   fluticasone 220 MCG/ACT inhaler Commonly known as:  FLOVENT HFA Inhale 2 puffs into the lungs 2 (two) times daily.   furosemide 40 MG tablet Commonly known as:  LASIX Take 1 tablet (40 mg total) by mouth daily. Start taking on:  12/27/2017   ibuprofen 200 MG tablet Commonly known as:  ADVIL,MOTRIN Take 600 mg by mouth every 6 (six) hours as needed for mild pain or moderate pain.   lisinopril 2.5 MG tablet Commonly known as:  PRINIVIL,ZESTRIL Take 1 tablet (2.5 mg total) by mouth daily. Start taking on:  12/27/2017   omeprazole 20 MG capsule Commonly known as:  PRILOSEC Take 20 mg by mouth daily as needed (for acid reflux).   sertraline 100 MG tablet Commonly known as:  ZOLOFT Take 100 mg by mouth daily.            Durable Medical Equipment  (From admission, onward)        Start     Ordered   12/26/17 1512  For home use only DME oxygen  Once    Comments:  Please assess pt for port concentrator and for discharge home deliver port oxygen tank delivered to hospital  Question Answer Comment  Mode or (Route) Nasal cannula   Liters per Minute 2   Frequency Continuous (stationary and portable oxygen unit needed)   Oxygen delivery system Gas      12/26/17 1513      Patients skin is clean, dry and intact, no evidence of skin break down. IV site discontinued and catheter remains intact. Site  without signs and symptoms of complications. Dressing and pressure applied.  Patient escorted to car by NT in a wheelchair,  no distress noted upon discharge.  Ann Giles 12/26/2017 7:06 PM

## 2017-12-26 NOTE — Progress Notes (Signed)
SATURATION QUALIFICATIONS: (This note is used to comply with regulatory documentation for home oxygen)  Patient Saturations on Room Air at Rest = 94%  Patient Saturations on Room Air while Ambulating = 88%  Patient Saturations on 2 Liters of oxygen while Ambulating = 95%  Please briefly explain why patient needs home oxygen: pt desats while ambulating on room air

## 2018-03-12 ENCOUNTER — Emergency Department (HOSPITAL_COMMUNITY)
Admission: EM | Admit: 2018-03-12 | Discharge: 2018-03-12 | Disposition: A | Discharge status: Home / Self Care | Attending: Emergency Medicine | Admitting: Emergency Medicine

## 2018-03-12 ENCOUNTER — Other Ambulatory Visit: Payer: Self-pay

## 2018-03-12 ENCOUNTER — Emergency Department (HOSPITAL_COMMUNITY)

## 2018-03-12 ENCOUNTER — Encounter (HOSPITAL_COMMUNITY): Payer: Self-pay | Admitting: Emergency Medicine

## 2018-03-12 DIAGNOSIS — R109 Unspecified abdominal pain: Secondary | ICD-10-CM

## 2018-03-12 DIAGNOSIS — I5030 Unspecified diastolic (congestive) heart failure: Secondary | ICD-10-CM | POA: Insufficient documentation

## 2018-03-12 DIAGNOSIS — I110 Hypertensive heart disease with heart failure: Secondary | ICD-10-CM | POA: Insufficient documentation

## 2018-03-12 DIAGNOSIS — Z79899 Other long term (current) drug therapy: Secondary | ICD-10-CM | POA: Diagnosis not present

## 2018-03-12 DIAGNOSIS — N132 Hydronephrosis with renal and ureteral calculous obstruction: Secondary | ICD-10-CM | POA: Diagnosis not present

## 2018-03-12 DIAGNOSIS — J45909 Unspecified asthma, uncomplicated: Secondary | ICD-10-CM | POA: Diagnosis not present

## 2018-03-12 HISTORY — DX: Heart failure, unspecified: I50.9

## 2018-03-12 LAB — CBC WITH DIFFERENTIAL/PLATELET
BASOS ABS: 0 10*3/uL (ref 0.0–0.1)
Basophils Relative: 0 %
EOS PCT: 1 %
Eosinophils Absolute: 0 10*3/uL (ref 0.0–0.7)
HCT: 40.1 % (ref 36.0–46.0)
HEMOGLOBIN: 11.9 g/dL — AB (ref 12.0–15.0)
Lymphocytes Relative: 22 %
Lymphs Abs: 1.3 10*3/uL (ref 0.7–4.0)
MCH: 27.7 pg (ref 26.0–34.0)
MCHC: 29.7 g/dL — ABNORMAL LOW (ref 30.0–36.0)
MCV: 93.3 fL (ref 78.0–100.0)
Monocytes Absolute: 0.6 10*3/uL (ref 0.1–1.0)
Monocytes Relative: 10 %
NEUTROS PCT: 67 %
Neutro Abs: 4 10*3/uL (ref 1.7–7.7)
PLATELETS: 236 10*3/uL (ref 150–400)
RBC: 4.3 MIL/uL (ref 3.87–5.11)
RDW: 15.9 % — ABNORMAL HIGH (ref 11.5–15.5)
WBC: 6 10*3/uL (ref 4.0–10.5)

## 2018-03-12 LAB — COMPREHENSIVE METABOLIC PANEL
ALK PHOS: 69 U/L (ref 38–126)
ALT: 18 U/L (ref 14–54)
AST: 18 U/L (ref 15–41)
Albumin: 3.6 g/dL (ref 3.5–5.0)
Anion gap: 13 (ref 5–15)
BUN: 13 mg/dL (ref 6–20)
CO2: 28 mmol/L (ref 22–32)
CREATININE: 1.21 mg/dL — AB (ref 0.44–1.00)
Calcium: 9.5 mg/dL (ref 8.9–10.3)
Chloride: 101 mmol/L (ref 101–111)
GFR calc Af Amer: >60 mL/min (ref >60)
GFR, EST NON AFRICAN AMERICAN: 52 mL/min — AB (ref >60)
Glucose, Bld: 94 mg/dL (ref 65–99)
Potassium: 3.5 mmol/L (ref 3.5–5.1)
SODIUM: 142 mmol/L (ref 135–145)
Total Bilirubin: 1 mg/dL (ref 0.3–1.2)
Total Protein: 7.4 g/dL (ref 6.5–8.1)

## 2018-03-12 LAB — URINALYSIS, ROUTINE W REFLEX MICROSCOPIC
BACTERIA UA: NONE SEEN
Bilirubin Urine: NEGATIVE
Glucose, UA: NEGATIVE mg/dL
Ketones, ur: NEGATIVE mg/dL
Leukocytes, UA: NEGATIVE
Nitrite: NEGATIVE
PROTEIN: NEGATIVE mg/dL
Specific Gravity, Urine: 1.02 (ref 1.005–1.030)
pH: 5 (ref 5.0–8.0)

## 2018-03-12 MED ORDER — OXYCODONE-ACETAMINOPHEN 5-325 MG PO TABS: 2.0000 | ORAL_TABLET | ORAL | 0 refills | Status: DC | PRN

## 2018-03-12 MED ORDER — FUROSEMIDE 10 MG/ML IJ SOLN
40.0000 mg | Freq: Once | INTRAMUSCULAR | Status: AC
  Administered 2018-03-12: 40 mg via INTRAVENOUS
  Filled 2018-03-12: qty 4

## 2018-03-12 MED ORDER — TAMSULOSIN HCL 0.4 MG PO CAPS: 0.4000 mg | ORAL_CAPSULE | Freq: Every day | ORAL | 0 refills | Status: DC

## 2018-03-12 MED ORDER — ONDANSETRON 4 MG PO TBDP: 4.0000 mg | ORAL_TABLET | Freq: Three times a day (TID) | ORAL | 0 refills | Status: DC | PRN

## 2018-03-12 MED ORDER — KETOROLAC TROMETHAMINE 30 MG/ML IJ SOLN
30.0000 mg | Freq: Once | INTRAMUSCULAR | Status: AC
  Administered 2018-03-12: 30 mg via INTRAVENOUS
  Filled 2018-03-12: qty 1

## 2018-03-12 MED ORDER — OXYCODONE-ACETAMINOPHEN 5-325 MG PO TABS: 1.0000 | ORAL_TABLET | ORAL | 0 refills | Status: DC | PRN

## 2018-03-12 MED ORDER — ONDANSETRON HCL 4 MG/2ML IJ SOLN
4.0000 mg | Freq: Once | INTRAMUSCULAR | Status: AC
  Administered 2018-03-12: 4 mg via INTRAVENOUS
  Filled 2018-03-12: qty 2

## 2018-03-12 MED ORDER — SODIUM CHLORIDE 0.9 % IV BOLUS (SEPSIS)
1000.0000 mL | Freq: Once | INTRAVENOUS | Status: AC
  Administered 2018-03-12: 1000 mL via INTRAVENOUS

## 2018-03-12 MED ORDER — SODIUM CHLORIDE 0.9 % IV BOLUS (SEPSIS)
1000.0000 mL | Freq: Once | INTRAVENOUS | Status: AC
Start: 2018-03-12 — End: 2018-03-12
  Administered 2018-03-12: 1000 mL via INTRAVENOUS

## 2018-03-12 NOTE — ED Provider Notes (Signed)
[facility] EMERGENCY DEPARTMENT Provider Note   CSN: [id number] Arrival date & time: 03/12/18  1155     History   Chief Complaint Chief Complaint  Patient presents with  . Flank Pain    HPI Ann Giles is a 47 y.o. female.  HPI  The pt is a 47 y/o female - she has hx of morbid obesity -  She has had a Chief Complaint of back pain Located in the R lower back Onset 1 week ago - was mild - gradually worsening Now is much worse today - no radiation No associated fevers, coughing, abdominal pain or dysuria or bowel changes. And no hx of frequent UTI's.  She has noted that she has not been able to pass any urine today.   No meds taken prior to arrival.  Prior hx of chole and hysterectomy  Past Medical History:  Diagnosis Date  . Arthritis   . Asthma   . Bronchitis   . CHF (congestive heart failure) (HCC)   . Depression   . Hypertension     Patient Active Problem List   Diagnosis Date Noted  . Acute diastolic CHF (congestive heart failure) (HCC) 12/23/2017  . Obesity, Class III, BMI 40-49.9 (morbid obesity) (HCC) 12/23/2017  . Acute bronchospasm 09/09/2016  . Asthma 09/09/2016  . Hypertension 09/09/2016  . Depression 09/09/2016  . Leg swelling 09/09/2016  . Acute respiratory failure with hypoxia (HCC) 09/09/2016  . Bronchospasm, acute 09/09/2016    Past Surgical History:  Procedure Laterality Date  . ABDOMINAL HYSTERECTOMY    . CHOLECYSTECTOMY    . GASTROPLASTY       OB History   None      Home Medications    Prior to Admission medications   Medication Sig Start Date End Date Taking? Authorizing Provider  albuterol (PROVENTIL HFA;VENTOLIN HFA) 108 (90 Base) MCG/ACT inhaler Inhale 2 puffs into the lungs every 6 (six) hours as needed for wheezing or shortness of breath.   Yes [provider]  amLODipine (NORVASC) 10 MG tablet Take 1 tablet (10 mg total) by mouth daily. 12/26/17  Yes Tat, Onalee Hua, MD  furosemide (LASIX) 40 MG tablet Take 1  tablet (40 mg total) by mouth daily. 12/27/17  Yes Tat, Onalee Hua, MD  ibuprofen (ADVIL,MOTRIN) 200 MG tablet Take 600 mg by mouth every 6 (six) hours as needed for mild pain or moderate pain.   Yes [provider]  lisinopril (PRINIVIL,ZESTRIL) 2.5 MG tablet Take 1 tablet (2.5 mg total) by mouth daily. 12/27/17  Yes Tat, Onalee Hua, MD  omeprazole (PRILOSEC) 20 MG capsule Take 20 mg by mouth daily as needed (for acid reflux).    Yes [provider]  sertraline (ZOLOFT) 100 MG tablet Take 100 mg by mouth daily. 02/13/16  Yes [provider]  fluticasone (FLOVENT HFA) 220 MCG/ACT inhaler Inhale 2 puffs into the lungs 2 (two) times daily. Patient not taking: Reported on 03/12/2018 09/12/16   Philip Aspen, Limmie Patricia, MD    Family History Family History  Problem Relation Age of Onset  . Asthma Neg Hx     Social History Social History   Tobacco Use  . Smoking status: Never Smoker  . Smokeless tobacco: Never Used  Substance Use Topics  . Alcohol use: No  . Drug use: No     Allergies   Patient has no known allergies.   Review of Systems Review of Systems  Constitutional: Negative for chills and fever.  HENT: Negative for sore throat.  Eyes: Negative for discharge and redness.  Respiratory: Negative for cough and shortness of breath.   Cardiovascular: Negative for chest pain and leg swelling.  Gastrointestinal: Positive for nausea. Negative for abdominal pain, diarrhea and vomiting.  Genitourinary: Positive for difficulty urinating. Negative for dysuria and flank pain.  Musculoskeletal: Positive for back pain.  Skin: Negative for rash and wound.  Neurological: Negative for headaches.  Hematological: Negative for adenopathy.     Physical Exam Updated Vital Signs BP (!) 149/97 (BP Location: Right Arm)   Pulse 62   Temp 97.8 F (36.6 C) (Oral)   Resp (!) 26   Ht 5\' 8"  (1.727 m)   Wt (!) 175.5 kg (387 lb)   SpO2 92%   BMI 58.84 kg/m   Physical Exam    Constitutional: She appears well-developed and well-nourished. No distress.  HENT:  Head: Normocephalic and atraumatic.  Mouth/Throat: Oropharynx is clear and moist. No oropharyngeal exudate.  Eyes: Pupils are equal, round, and reactive to light. Conjunctivae and EOM are normal. Right eye exhibits no discharge. Left eye exhibits no discharge. No scleral icterus.  Neck: Normal range of motion. Neck supple. No JVD present. No thyromegaly present.  Cardiovascular: Normal rate, regular rhythm, normal heart sounds and intact distal pulses. Exam reveals no gallop and no friction rub.  No murmur heard. Pulmonary/Chest: Effort normal and breath sounds normal. No respiratory distress. She has no wheezes. She has no rales.  Abdominal: Soft. Bowel sounds are normal. She exhibits no distension and no mass. There is no tenderness.  Very soft and nontender abdomen, morbidly obese, large pannus  Musculoskeletal: Normal range of motion. She exhibits no edema or tenderness.  The patient is morbidly obese but able to ambulate without difficulty.  There is no asymmetry of the legs, mild bilateral swelling which is symmetrical.  Upper extremities with normal strength and range of motion, compartments are diffusely soft.  There is no tenderness over the midline of the cervical thoracic or lumbar spines but there is some right paraspinal tenderness closer to the right CVA region  Lymphadenopathy:    She has no cervical adenopathy.  Neurological: She is alert. Coordination normal.  Skin: Skin is warm and dry. No rash noted. No erythema.  Psychiatric: She has a normal mood and affect. Her behavior is normal.  Nursing note and vitals reviewed.    ED Treatments / Results  Labs (all labs ordered are listed, but only abnormal results are displayed) Labs Reviewed  CBC WITH DIFFERENTIAL/PLATELET - Abnormal; Notable for the following components:      Result Value   Hemoglobin 11.9 (*)    MCHC 29.7 (*)    RDW 15.9  (*)    All other components within normal limits  COMPREHENSIVE METABOLIC PANEL - Abnormal; Notable for the following components:   Creatinine, Ser 1.21 (*)    GFR calc non Af Amer 52 (*)    All other components within normal limits  URINE CULTURE  URINALYSIS, ROUTINE W REFLEX MICROSCOPIC     Radiology No results found.  Procedures Procedures (including critical care time)  Medications Ordered in ED Medications  sodium chloride 0.9 % bolus 1,000 mL (0 mLs Intravenous Stopped 03/12/18 1442)  ondansetron (ZOFRAN) injection 4 mg (4 mg Intravenous Given 03/12/18 1251)  ketorolac (TORADOL) 30 MG/ML injection 30 mg (30 mg Intravenous Given 03/12/18 1251)     Initial Impression / Assessment and Plan / ED Course  I have reviewed the triage vital signs and the nursing notes.  Pertinent labs & imaging results that were available during my care of the patient were reviewed by me and considered in my medical decision making (see chart for details).  Clinical Course as of Mar 12 1518  Sun Mar 12, 2018  1456 Comprehensive metabolic panel(!) [BM]  1456 Creatinine(!): 1.21 [BM]  1456 BUN: 13 [BM]  1456 WBC: 6.0 [BM]  1456 Labs overall very unremarkable, no leukocytosis, no significant renal dysfunction, no significant anemia, urinalysis pending.  Hemoglobin(!): 11.9 [BM]    Clinical Course User Index [BM] Eber HongMiller, Elijio Staples, MD    The patient has right lower back pain more towards the CVA region, it seems to be constant today, the intermittent nature of this with progressive symptoms suggestive of urinary source especially given her very little urinary bladder volume on my exam, visualized with an ultrasound there is a very small bladder, the bladder scanner for volumes has less than 50 cc of urine.  Possible MSK Possible renal / stone vs UTI Well appearing otherwise, no fevers  UA, labs, hydrate  At change of shift - patient has not been able to give much urine IV hydration Oral  hydration Dr. Particia NearingHaviland will assume care of the patient to follow up results and disposition according to her MDM  Final Clinical Impressions(s) / ED Diagnoses   Final diagnoses:  Right flank pain     Eber HongMiller, Montell Leopard, MD 03/12/18 [phone]

## 2018-03-12 NOTE — ED Triage Notes (Addendum)
Patient c/o right flank pain x1 week that got progressively worse in since yesterday. Per patient urgency to go with oliguria. Denies any fevers. Per patient some dysuria and nausea. Denies any diarrhea or vomiting.

## 2018-03-12 NOTE — ED Provider Notes (Signed)
Pt signed out by Dr. Hyacinth MeekerMiller awaiting urine results.  Urine did show some hematuria, so I spoke with pt and we decided to do a CT urogram to eval for kidney stone.  Pt does have an obstructive kidney stone, but her pain is well controlled.  She has not required any pain medications for hours.  She is told to f/u with urology and to return if worse.   Jacalyn LefevreHaviland, Maija Biggers, MD 03/12/18 2001

## 2018-03-13 MED FILL — Oxycodone w/ Acetaminophen Tab 5-325 MG: ORAL | Qty: 6 | Status: AC

## 2018-03-14 LAB — URINE CULTURE: Culture: <10000 — AB

## 2019-06-23 ENCOUNTER — Emergency Department (HOSPITAL_COMMUNITY)

## 2019-06-23 ENCOUNTER — Inpatient Hospital Stay (HOSPITAL_COMMUNITY)
Admission: EM | Admit: 2019-06-23 | Discharge: 2019-06-26 | DRG: 291 | Disposition: A | Discharge status: Home / Self Care | Attending: Internal Medicine | Admitting: Internal Medicine

## 2019-06-23 ENCOUNTER — Inpatient Hospital Stay (HOSPITAL_COMMUNITY)

## 2019-06-23 ENCOUNTER — Other Ambulatory Visit: Payer: Self-pay

## 2019-06-23 ENCOUNTER — Encounter (HOSPITAL_COMMUNITY): Payer: Self-pay | Admitting: Emergency Medicine

## 2019-06-23 DIAGNOSIS — Z6841 Body Mass Index (BMI) 40.0 and over, adult: Secondary | ICD-10-CM | POA: Diagnosis not present

## 2019-06-23 DIAGNOSIS — I5033 Acute on chronic diastolic (congestive) heart failure: Secondary | ICD-10-CM | POA: Diagnosis present

## 2019-06-23 DIAGNOSIS — F329 Major depressive disorder, single episode, unspecified: Secondary | ICD-10-CM | POA: Diagnosis present

## 2019-06-23 DIAGNOSIS — M1990 Unspecified osteoarthritis, unspecified site: Secondary | ICD-10-CM | POA: Diagnosis present

## 2019-06-23 DIAGNOSIS — J189 Pneumonia, unspecified organism: Secondary | ICD-10-CM | POA: Diagnosis not present

## 2019-06-23 DIAGNOSIS — E662 Morbid (severe) obesity with alveolar hypoventilation: Secondary | ICD-10-CM | POA: Diagnosis present

## 2019-06-23 DIAGNOSIS — J9801 Acute bronchospasm: Secondary | ICD-10-CM | POA: Diagnosis present

## 2019-06-23 DIAGNOSIS — K219 Gastro-esophageal reflux disease without esophagitis: Secondary | ICD-10-CM | POA: Diagnosis present

## 2019-06-23 DIAGNOSIS — Z20828 Contact with and (suspected) exposure to other viral communicable diseases: Secondary | ICD-10-CM | POA: Diagnosis present

## 2019-06-23 DIAGNOSIS — J9601 Acute respiratory failure with hypoxia: Secondary | ICD-10-CM | POA: Diagnosis present

## 2019-06-23 DIAGNOSIS — J45909 Unspecified asthma, uncomplicated: Secondary | ICD-10-CM | POA: Diagnosis present

## 2019-06-23 DIAGNOSIS — R600 Localized edema: Secondary | ICD-10-CM

## 2019-06-23 DIAGNOSIS — I110 Hypertensive heart disease with heart failure: Principal | ICD-10-CM | POA: Diagnosis present

## 2019-06-23 DIAGNOSIS — R0902 Hypoxemia: Secondary | ICD-10-CM | POA: Diagnosis present

## 2019-06-23 HISTORY — DX: Obstructive sleep apnea (adult) (pediatric): G47.33

## 2019-06-23 LAB — CBC WITH DIFFERENTIAL/PLATELET
Abs Immature Granulocytes: 0.06 10*3/uL (ref 0.00–0.07)
Basophils Absolute: 0 10*3/uL (ref 0.0–0.1)
Basophils Relative: 0 %
Eosinophils Absolute: 0.1 10*3/uL (ref 0.0–0.5)
Eosinophils Relative: 1 %
HCT: 41.3 % (ref 36.0–46.0)
Hemoglobin: 11.7 g/dL — ABNORMAL LOW (ref 12.0–15.0)
Immature Granulocytes: 1 %
Lymphocytes Relative: 18 %
Lymphs Abs: 1.3 10*3/uL (ref 0.7–4.0)
MCH: 29.1 pg (ref 26.0–34.0)
MCHC: 28.3 g/dL — ABNORMAL LOW (ref 30.0–36.0)
MCV: 102.7 fL — ABNORMAL HIGH (ref 80.0–100.0)
Monocytes Absolute: 0.6 10*3/uL (ref 0.1–1.0)
Monocytes Relative: 8 %
Neutro Abs: 5.1 10*3/uL (ref 1.7–7.7)
Neutrophils Relative %: 72 %
Platelets: 233 10*3/uL (ref 150–400)
RBC: 4.02 MIL/uL (ref 3.87–5.11)
RDW: 15.1 % (ref 11.5–15.5)
WBC: 7.1 10*3/uL (ref 4.0–10.5)
nRBC: 0 % (ref 0.0–0.2)

## 2019-06-23 LAB — SARS CORONAVIRUS 2 BY RT PCR (HOSPITAL ORDER, PERFORMED IN ~~LOC~~ HOSPITAL LAB): SARS Coronavirus 2: NEGATIVE

## 2019-06-23 LAB — BRAIN NATRIURETIC PEPTIDE: B Natriuretic Peptide: 21 pg/mL (ref 0.0–100.0)

## 2019-06-23 LAB — BASIC METABOLIC PANEL
Anion gap: 10 (ref 5–15)
BUN: 11 mg/dL (ref 6–20)
CO2: 35 mmol/L — ABNORMAL HIGH (ref 22–32)
Calcium: 8.8 mg/dL — ABNORMAL LOW (ref 8.9–10.3)
Chloride: 100 mmol/L (ref 98–111)
Creatinine, Ser: 0.97 mg/dL (ref 0.44–1.00)
GFR calc Af Amer: >60 mL/min (ref >60)
GFR calc non Af Amer: >60 mL/min (ref >60)
Glucose, Bld: 96 mg/dL (ref 70–99)
Potassium: 4.1 mmol/L (ref 3.5–5.1)
Sodium: 145 mmol/L (ref 135–145)

## 2019-06-23 LAB — TROPONIN I (HIGH SENSITIVITY)
Troponin I (High Sensitivity): 4 ng/L (ref <18)
Troponin I (High Sensitivity): 7 ng/L (ref <18)

## 2019-06-23 LAB — PROCALCITONIN: Procalcitonin: <0.1 ng/mL

## 2019-06-23 LAB — STREP PNEUMONIAE URINARY ANTIGEN: Strep Pneumo Urinary Antigen: NEGATIVE

## 2019-06-23 MED ORDER — LISINOPRIL 5 MG PO TABS
2.5000 mg | ORAL_TABLET | Freq: Every day | ORAL | Status: DC
  Administered 2019-06-23 – 2019-06-26 (×4): 2.5 mg via ORAL
  Filled 2019-06-23 (×5): qty 1

## 2019-06-23 MED ORDER — SODIUM CHLORIDE 0.9 % IV SOLN
INTRAVENOUS | Status: DC | PRN
  Administered 2019-06-23: 250 mL via INTRAVENOUS

## 2019-06-23 MED ORDER — METHYLPREDNISOLONE SODIUM SUCC 125 MG IJ SOLR
125.0000 mg | Freq: Once | INTRAMUSCULAR | Status: AC
  Administered 2019-06-23: 125 mg via INTRAVENOUS
  Filled 2019-06-23: qty 2

## 2019-06-23 MED ORDER — ACETAMINOPHEN 325 MG PO TABS
650.0000 mg | ORAL_TABLET | ORAL | Status: DC | PRN
  Administered 2019-06-23 – 2019-06-25 (×2): 650 mg via ORAL
  Filled 2019-06-23 (×2): qty 2

## 2019-06-23 MED ORDER — SODIUM CHLORIDE 0.9 % IV SOLN
1.0000 g | INTRAVENOUS | Status: DC
  Administered 2019-06-24 – 2019-06-26 (×3): 1 g via INTRAVENOUS
  Filled 2019-06-23 (×4): qty 10

## 2019-06-23 MED ORDER — BUDESONIDE 0.5 MG/2ML IN SUSP
0.5000 mg | Freq: Two times a day (BID) | RESPIRATORY_TRACT | Status: DC
  Administered 2019-06-23 – 2019-06-26 (×6): 0.5 mg via RESPIRATORY_TRACT
  Filled 2019-06-23 (×6): qty 2

## 2019-06-23 MED ORDER — AMLODIPINE BESYLATE 5 MG PO TABS
10.0000 mg | ORAL_TABLET | Freq: Every day | ORAL | Status: DC
  Administered 2019-06-23 – 2019-06-26 (×4): 10 mg via ORAL
  Filled 2019-06-23 (×4): qty 2

## 2019-06-23 MED ORDER — PANTOPRAZOLE SODIUM 40 MG PO TBEC
40.0000 mg | DELAYED_RELEASE_TABLET | Freq: Every day | ORAL | Status: DC
Start: 2019-06-23 — End: 2019-06-26
  Administered 2019-06-23 – 2019-06-26 (×4): 40 mg via ORAL
  Filled 2019-06-23 (×4): qty 1

## 2019-06-23 MED ORDER — IPRATROPIUM-ALBUTEROL 0.5-2.5 (3) MG/3ML IN SOLN
3.0000 mL | Freq: Once | RESPIRATORY_TRACT | Status: AC
  Administered 2019-06-23: 11:00:00 3 mL via RESPIRATORY_TRACT
  Filled 2019-06-23: qty 3

## 2019-06-23 MED ORDER — SODIUM CHLORIDE 0.9% FLUSH
3.0000 mL | Freq: Two times a day (BID) | INTRAVENOUS | Status: DC
  Administered 2019-06-23 – 2019-06-26 (×6): 3 mL via INTRAVENOUS

## 2019-06-23 MED ORDER — IPRATROPIUM-ALBUTEROL 0.5-2.5 (3) MG/3ML IN SOLN
3.0000 mL | Freq: Four times a day (QID) | RESPIRATORY_TRACT | Status: DC
  Administered 2019-06-23 – 2019-06-24 (×4): 3 mL via RESPIRATORY_TRACT
  Filled 2019-06-23 (×4): qty 3

## 2019-06-23 MED ORDER — ONDANSETRON HCL 4 MG/2ML IJ SOLN: 4.0000 mg | Freq: Four times a day (QID) | INTRAMUSCULAR | Status: DC | PRN

## 2019-06-23 MED ORDER — SODIUM CHLORIDE 0.9% FLUSH: 3.0000 mL | INTRAVENOUS | Status: DC | PRN

## 2019-06-23 MED ORDER — SODIUM CHLORIDE 0.9 % IV SOLN
500.0000 mg | Freq: Once | INTRAVENOUS | Status: AC
  Administered 2019-06-23: 12:00:00 500 mg via INTRAVENOUS
  Filled 2019-06-23: qty 500

## 2019-06-23 MED ORDER — ENOXAPARIN SODIUM 100 MG/ML ~~LOC~~ SOLN
90.0000 mg | SUBCUTANEOUS | Status: DC
  Administered 2019-06-23 – 2019-06-25 (×3): 90 mg via SUBCUTANEOUS
  Filled 2019-06-23 (×3): qty 1

## 2019-06-23 MED ORDER — SODIUM CHLORIDE 0.9 % IV SOLN
1.0000 g | Freq: Once | INTRAVENOUS | Status: AC
  Administered 2019-06-23: 11:00:00 1 g via INTRAVENOUS
  Filled 2019-06-23: qty 10

## 2019-06-23 MED ORDER — SERTRALINE HCL 50 MG PO TABS
100.0000 mg | ORAL_TABLET | Freq: Every day | ORAL | Status: DC
  Administered 2019-06-23 – 2019-06-26 (×4): 100 mg via ORAL
  Filled 2019-06-23 (×4): qty 2

## 2019-06-23 MED ORDER — AZITHROMYCIN 250 MG PO TABS
500.0000 mg | ORAL_TABLET | Freq: Every day | ORAL | Status: DC
  Administered 2019-06-24 – 2019-06-26 (×3): 500 mg via ORAL
  Filled 2019-06-23 (×4): qty 2

## 2019-06-23 MED ORDER — FUROSEMIDE 10 MG/ML IJ SOLN
40.0000 mg | Freq: Two times a day (BID) | INTRAMUSCULAR | Status: AC
  Administered 2019-06-23 – 2019-06-25 (×5): 40 mg via INTRAVENOUS
  Filled 2019-06-23 (×5): qty 4

## 2019-06-23 MED ORDER — ALBUTEROL SULFATE HFA 108 (90 BASE) MCG/ACT IN AERS
4.0000 | INHALATION_SPRAY | Freq: Once | RESPIRATORY_TRACT | Status: AC
  Administered 2019-06-23: 10:00:00 4 via RESPIRATORY_TRACT
  Filled 2019-06-23: qty 6.7

## 2019-06-23 MED ORDER — SODIUM CHLORIDE 0.9 % IV SOLN: 250.0000 mL | INTRAVENOUS | Status: DC | PRN

## 2019-06-23 MED ORDER — ENOXAPARIN SODIUM 40 MG/0.4ML ~~LOC~~ SOLN: 40.0000 mg | SUBCUTANEOUS | Status: DC

## 2019-06-23 NOTE — ED Provider Notes (Signed)
[facility] EMERGENCY DEPARTMENT Provider Note   CSN: [id number] Arrival date & time: 06/23/19  [id number]     History   Chief Complaint Chief Complaint  Patient presents with   Shortness of Breath    HPI Ann Giles is a 48 y.o. female.     Patient is a 48 year old female who presents to the emergency department with a complaint of shortness of breath.  Patient has a history of congestive heart failure, sleep apnea, asthma, and obesity.  The patient states that this problem is been going on for the last 2 to 3 days.  She notes that she is having increasing shortness of breath with going from one room to the next.  She has tried her medications at home and these do not seem to be working well.  There is been no recent fever or chills.  No hemoptysis.  Patient states she has a tightness in her chest particularly when she is walking around from one room to the other.  This is resolved after she is resting.  She has had some swelling of her legs.  This is usually worse in the evenings and at night than in the mornings.  Is been no recent injury or trauma to the chest.  No operations or procedures.  No recent diagnoses of cancer.  The history is provided by the patient.  Shortness of Breath Associated symptoms: cough and wheezing   Associated symptoms: no abdominal pain, no chest pain, no ear pain, no fever, no rash, no sore throat and no vomiting     Past Medical History:  Diagnosis Date   Arthritis    Asthma    Bronchitis    CHF (congestive heart failure) (HCC)    Depression    Hypertension    OSA (obstructive sleep apnea)     Patient Active Problem List   Diagnosis Date Noted   Acute diastolic CHF (congestive heart failure) (HCC) 12/23/2017   Obesity, Class III, BMI 40-49.9 (morbid obesity) (HCC) 12/23/2017   Acute bronchospasm 09/09/2016   Asthma 09/09/2016   Hypertension 09/09/2016   Depression 09/09/2016   Leg swelling 09/09/2016   Acute  respiratory failure with hypoxia (HCC) 09/09/2016   Bronchospasm, acute 09/09/2016    Past Surgical History:  Procedure Laterality Date   ABDOMINAL HYSTERECTOMY     CHOLECYSTECTOMY     GASTROPLASTY       OB History   No obstetric history on file.      Home Medications    Prior to Admission medications   Medication Sig Start Date End Date Taking? Authorizing Provider  albuterol (PROVENTIL HFA;VENTOLIN HFA) 108 (90 Base) MCG/ACT inhaler Inhale 2 puffs into the lungs every 6 (six) hours as needed for wheezing or shortness of breath.    [provider]  amLODipine (NORVASC) 10 MG tablet Take 1 tablet (10 mg total) by mouth daily. 12/26/17   Catarina Hartshornat, David, MD  fluticasone (FLOVENT HFA) 220 MCG/ACT inhaler Inhale 2 puffs into the lungs 2 (two) times daily. Patient not taking: Reported on 03/12/2018 09/12/16   Philip AspenHernandez Acosta, Limmie PatriciaEstela Y, MD  furosemide (LASIX) 40 MG tablet Take 1 tablet (40 mg total) by mouth daily. 12/27/17   Catarina Hartshornat, David, MD  ibuprofen (ADVIL,MOTRIN) 200 MG tablet Take 600 mg by mouth every 6 (six) hours as needed for mild pain or moderate pain.    [provider]  lisinopril (PRINIVIL,ZESTRIL) 2.5 MG tablet Take 1 tablet (2.5 mg total) by mouth daily. 12/27/17  Orson Eva, MD  omeprazole (PRILOSEC) 20 MG capsule Take 20 mg by mouth daily as needed (for acid reflux).     [provider]  ondansetron (ZOFRAN ODT) 4 MG disintegrating tablet Take 1 tablet (4 mg total) by mouth every 8 (eight) hours as needed. 03/12/18   Isla Pence, MD  oxyCODONE-acetaminophen (PERCOCET/ROXICET) 5-325 MG tablet Take 1-2 tablets by mouth every 4 (four) hours as needed for severe pain. 03/12/18   Isla Pence, MD  oxyCODONE-acetaminophen (PERCOCET/ROXICET) 5-325 MG tablet Take 1 tablet by mouth every 4 (four) hours as needed for severe pain. 03/12/18   Isla Pence, MD  sertraline (ZOLOFT) 100 MG tablet Take 100 mg by mouth daily. 02/13/16   [provider]    tamsulosin (FLOMAX) 0.4 MG CAPS capsule Take 1 capsule (0.4 mg total) by mouth daily. 03/12/18   Isla Pence, MD    Family History Family History  Problem Relation Age of Onset   Asthma Neg Hx     Social History Social History   Tobacco Use   Smoking status: Never Smoker   Smokeless tobacco: Never Used  Substance Use Topics   Alcohol use: No   Drug use: No     Allergies   Patient has no known allergies.   Review of Systems Review of Systems  Constitutional: Positive for activity change. Negative for chills and fever.  HENT: Negative for ear pain and sore throat.   Eyes: Negative for pain and visual disturbance.  Respiratory: Positive for cough, chest tightness, shortness of breath and wheezing.   Cardiovascular: Positive for leg swelling. Negative for chest pain and palpitations.  Gastrointestinal: Negative for abdominal pain and vomiting.  Genitourinary: Negative for dysuria and hematuria.  Musculoskeletal: Negative for arthralgias and back pain.  Skin: Negative for color change and rash.  Neurological: Negative for seizures and syncope.  All other systems reviewed and are negative.    Physical Exam Updated Vital Signs BP (!) 168/89 (BP Location: Left Wrist)    Pulse 75    Temp 98.9 F (37.2 C) (Oral)    Resp 18    Ht 5\' 8"  (1.727 m)    Wt (!) 176.4 kg    SpO2 97%    BMI 59.15 kg/m   Physical Exam Vitals signs and nursing note reviewed.  Constitutional:      Appearance: She is well-developed. She is not toxic-appearing.  HENT:     Head: Normocephalic.     Right Ear: Tympanic membrane and external ear normal.     Left Ear: Tympanic membrane and external ear normal.  Eyes:     General: Lids are normal.     Pupils: Pupils are equal, round, and reactive to light.  Neck:     Musculoskeletal: Normal range of motion and neck supple.     Vascular: No carotid bruit.  Cardiovascular:     Rate and Rhythm: Normal rate and regular rhythm.     Pulses:  Normal pulses.     Heart sounds: Normal heart sounds.  Pulmonary:     Effort: No respiratory distress.     Breath sounds: Wheezing present.     Comments: Patient speaks in short sentences.  There is symmetrical rise and fall of the chest.  There are wheezes throughout the lung fields on expiration.  There are coarse breath sounds throughout both lung fields. Abdominal:     General: Bowel sounds are normal.     Palpations: Abdomen is soft.     Tenderness: There  is no abdominal tenderness. There is no guarding.  Musculoskeletal: Normal range of motion.     Comments: Trace edema of the lower extremities.  No pitting.  Capillary refill is less than 2 seconds.  Dorsalis pedis pulses 2+.  Radial pulses 2+.  Lymphadenopathy:     Head:     Right side of head: No submandibular adenopathy.     Left side of head: No submandibular adenopathy.     Cervical: No cervical adenopathy.  Skin:    General: Skin is warm and dry.  Neurological:     Mental Status: She is alert and oriented to person, place, and time.     Cranial Nerves: No cranial nerve deficit.     Sensory: No sensory deficit.  Psychiatric:        Speech: Speech normal.      ED Treatments / Results  Labs (all labs ordered are listed, but only abnormal results are displayed) Labs Reviewed  SARS CORONAVIRUS 2 (HOSPITAL ORDER, PERFORMED IN [facility])  BASIC METABOLIC PANEL  BRAIN NATRIURETIC PEPTIDE  TROPONIN I (HIGH SENSITIVITY)  TROPONIN I (HIGH SENSITIVITY)  CBC WITH DIFFERENTIAL/PLATELET    EKG None  Radiology No results found.  Procedures Procedures (including critical care time)  Medications Ordered in ED Medications  methylPREDNISolone sodium succinate (SOLU-MEDROL) 125 mg/2 mL injection 125 mg (has no administration in time range)  albuterol (VENTOLIN HFA) 108 (90 Base) MCG/ACT inhaler 4 puff (4 puffs Inhalation Given 06/23/19 0932)     Initial Impression / Assessment and Plan / ED Course  I have  reviewed the triage vital signs and the nursing notes.  Pertinent labs & imaging results that were available during my care of the patient were reviewed by me and considered in my medical decision making (see chart for details).          Final Clinical Impressions(s) / ED Diagnoses MDm  Vital signs reviewed.  Pulse oximetry is 97 to 99% on 3 L of oxygen.  Patient uses CPAP at home, but does not use home oxygen.  On examination patient noted to have some tachypnea, and speaking in short sentences.  There is wheezing and coarse breath sounds present.  Patient treated with albuterol by inhaler and IV Solu-Medrol.  COVID-19 virus tests pending for additional respiratory treatments.  The B natruretic peptide is normal at 21.  Doubt acute exacerbation of congestive failure.  Chest x-ray shows suspicion for patchy bilateral infiltrates consistent with pneumonia.  Right seems to be greater than left side.  IV Zithromax and Rocephin started for community-acquired pneumonia. Case discussed with Dr Clarene DukeMcManus.  Complete blood count nonacute.  Troponin is negative for acute cardiac event. Basic metabolic panel shows the CO2 to be elevated at 35.  Review of previous records shows the patient is usually in this range on her CO2. I discussed the findings with the patient in terms which she understands.  We will observe the patient on the 3 L of oxygen for now.  We will also evaluate the patient off of oxygen to determine if she will be able to go home or if she will need admission.  When taken off of oxygen, patient desats to 87% on room air with just lying in bed.  Will consult [facility] hospitalist for admission. Dr Tat will see pt for admission.   Final diagnoses:  Community acquired pneumonia, unspecified laterality  Hypoxia    ED Discharge Orders    None  Serine Kea, PA-C 07/0Ivery Quale4/20 1223    Samuel JesterMcManus, Kathleen, DO 06/25/19 [phone]

## 2019-06-23 NOTE — ED Notes (Signed)
Report to Lisa, RN

## 2019-06-23 NOTE — ED Notes (Signed)
ED Provider at bedside. 

## 2019-06-23 NOTE — ED Triage Notes (Signed)
Pt from home. C/o of sob with exertion and cough x 2 days

## 2019-06-23 NOTE — ED Notes (Signed)
Pulse ox 96  O2 off per HB request

## 2019-06-23 NOTE — ED Notes (Signed)
Pt O2 stopped  Labored breathing with decrease of sats to 87 per cent on RA  O2 reapplied and provider notified

## 2019-06-23 NOTE — ED Notes (Signed)
Call to floor for report   Lattie Haw, RN will call back

## 2019-06-23 NOTE — H&P (Signed)
History and Physical  Ann Giles ZOX:[id number] DOB: 1971/07/13 DOA: 06/23/2019   PCP: April MansonWhite, Marsha L, NP   Patient coming from: Home  Chief Complaint: dyspnea  HPI:  Ann Giles is a 48 y.o. female with medical history of dCHF, OSA/OHS, depression, hypertension, GERD, and bronchospasm presenting with 3-day history of shortness of breath that began on 06/20/2019.  Patient has had a nonproductive cough for the same period of time.  She denies any hemoptysis.  She denies any fevers, chills, nausea, vomiting, diarrhea, but she states that she has had some loose stools in the past 24 hours.  She denies any recent sick contacts.  She has been complaining of some increase in her lower extremity edema and orthopnea type symptoms for the past 3 days.  She stated that [facility] and had to use it with her pillows to prop herself up at nighttime.  The patient states that she has been watching her fluid intake as well as been compliant with her medications.  She does not weigh herself.  In the emergency department, patient oxygen saturation 87% room air.  She was afebrile hemodynamically stable.  BMP, CBC were unremarkable.  Chest x-ray showed increased interstitial markings and possible patchy infiltrate in the right upper lobe.  The patient was given ceftriaxone, azithromycin, Solu-Medrol, and duo nebs in the emergency department.  She was stable on 3 L nasal cannula with saturation 92-94%.  Assessment/Plan: Acute respiratory failure with hypoxia -presently stable on 3 L -Multifactorial including CHF, bronchospasm and underlying OSA/OHS  Pulmonary infiltrates -doubt pneumonia as pt is afebrile without leukocytosis -check PCT -CT chest -SARS-CoV2--neg -viral respiratory panel  Acute on chronic diastolic CHF -Start IV furosemide twice daily -Daily weights -Echo -12/24/2017 echo EF 65-70%,indeterminant diastolic dysfunction, no WMA  Bronchospasm  -10/16/2017PFTs--showed diffusion defect and interstitial process without airflow obstruction. There was no bronchodilator response -continue BDs  OSA -compliant with CPAP at home -CPAP during hospitalization  Essential hypertension -Continue amlodipineand lisinopril  Depression -Continue Zoloft  Lower Extremity Edema and pain -venous duplex  Morbid Obesity -BMI 59.15     Past Medical History:  Diagnosis Date  . Arthritis   . Asthma   . Bronchitis   . CHF (congestive heart failure) (HCC)   . Depression   . Hypertension   . OSA (obstructive sleep apnea)    Past Surgical History:  Procedure Laterality Date  . ABDOMINAL HYSTERECTOMY    . CHOLECYSTECTOMY    . GASTROPLASTY     Social History:  reports that she has never smoked. She has never used smokeless tobacco. She reports that she does not drink alcohol or use drugs.   Family History  Problem Relation Age of Onset  . Asthma Neg Hx      No Known Allergies   Prior to Admission medications   Medication Sig Start Date End Date Taking? Authorizing Provider  albuterol (PROVENTIL HFA;VENTOLIN HFA) 108 (90 Base) MCG/ACT inhaler Inhale 2 puffs into the lungs every 6 (six) hours as needed for wheezing or shortness of breath.   Yes [provider]  amLODipine (NORVASC) 10 MG tablet Take 1 tablet (10 mg total) by mouth daily. 12/26/17  Yes Martavis Gurney, Onalee Huaavid, MD  furosemide (LASIX) 40 MG tablet Take 1 tablet (40 mg total) by mouth daily. 12/27/17  Yes Nilda Keathley, Onalee Huaavid, MD  lisinopril (PRINIVIL,ZESTRIL) 2.5 MG tablet Take 1 tablet (2.5 mg total) by mouth daily. Patient taking differently: Take  10 mg by mouth daily.  12/27/17  Yes Sharifah Champine, Shanon Brow, MD  omeprazole (PRILOSEC) 20 MG capsule Take 20 mg by mouth daily as needed (for acid reflux).    Yes [provider]  ondansetron (ZOFRAN ODT) 4 MG disintegrating tablet Take 1 tablet (4 mg total) by mouth every 8 (eight) hours as needed. 03/12/18  Yes Isla Pence, MD   sertraline (ZOLOFT) 100 MG tablet Take 100 mg by mouth daily. 02/13/16  Yes [provider]    Review of Systems:  Constitutional:  No weight loss, night sweats Head&Eyes: No headache.  No vision loss.  No eye pain or scotoma ENT:  No Difficulty swallowing,Tooth/dental problems,Sore throat,  No ear ache, post nasal drip,  Cardio-vascular:  No chest pain,PND,  dizziness, palpitations  GI:  No  abdominal pain, nausea, vomiting, diarrhea, loss of appetite, hematochezia, melena, heartburn, indigestion, Resp:   No coughing up of blood .No wheezing.No chest wall deformity  Skin:  no rash or lesions.  GU:  no dysuria, change in color of urine, no urgency or frequency. No flank pain.  Musculoskeletal:  No joint pain or swelling. No decreased range of motion. No back pain.  Psych:  No change in mood or affect. No depression or anxiety. Neurologic: No headache, no dysesthesia, no focal weakness, no vision loss. No syncope  Physical Exam: Vitals:   06/23/19 1000 06/23/19 1114 06/23/19 1130 06/23/19 1200  BP: 136/81  (!) 149/81 (!) 149/74  Pulse: 70  68 72  Resp: 13  17 16   Temp:      TempSrc:      SpO2: 93% 94% 94% 90%  Weight:      Height:       General:  A&O x 3, NAD, nontoxic, pleasant/cooperative Head/Eye: No conjunctival hemorrhage, no icterus, [place]/AT, No nystagmus ENT:  No icterus,  No thrush, good dentition, no pharyngeal exudate Neck:  No masses, no lymphadenpathy, no bruits CV:  RRR, no rub, no gallop, no S3 Lung: Bilateral crackles.  Bilateral expiratory wheeze. Abdomen: soft/NT, +BS, nondistended, no peritoneal signs Ext: No cyanosis, No rashes, No petechiae, No lymphangitis, 2+ LE edema Neuro: CNII-XII intact, strength 4/5 in bilateral upper and lower extremities, no dysmetria  Labs on Admission:  Basic Metabolic Panel: Recent Labs  Lab 06/23/19 0946  NA 145  K 4.1  CL 100  CO2 35*  GLUCOSE 96  BUN 11  CREATININE 0.97  CALCIUM 8.8*   Liver  Function Tests: No results for input(s): AST, ALT, ALKPHOS, BILITOT, PROT, ALBUMIN in the last 168 hours. No results for input(s): LIPASE, AMYLASE in the last 168 hours. No results for input(s): AMMONIA in the last 168 hours. CBC: Recent Labs  Lab 06/23/19 0946  WBC 7.1  NEUTROABS 5.1  HGB 11.7*  HCT 41.3  MCV 102.7*  PLT 233   Coagulation Profile: No results for input(s): INR, PROTIME in the last 168 hours. Cardiac Enzymes: No results for input(s): CKTOTAL, CKMB, CKMBINDEX, TROPONINI in the last 168 hours. BNP: Invalid input(s): POCBNP CBG: No results for input(s): GLUCAP in the last 168 hours. Urine analysis:    Component Value Date/Time   COLORURINE YELLOW 03/12/2018 1750   APPEARANCEUR CLEAR 03/12/2018 1750   LABSPEC 1.020 03/12/2018 1750   PHURINE 5.0 03/12/2018 1750   GLUCOSEU NEGATIVE 03/12/2018 1750   HGBUR SMALL (A) 03/12/2018 1750   BILIRUBINUR NEGATIVE 03/12/2018 1750   KETONESUR NEGATIVE 03/12/2018 1750   PROTEINUR NEGATIVE 03/12/2018 1750   NITRITE NEGATIVE 03/12/2018 1750  LEUKOCYTESUR NEGATIVE 03/12/2018 1750   Sepsis Labs: @LABRCNTIP (procalcitonin:4,lacticidven:4) ) Recent Results (from the past 240 hour(s))  SARS Coronavirus 2 (CEPHEID- Performed in [facility]), Hosp Order     Status: None   Collection Time: 06/23/19  9:48 AM   Specimen: Nasopharyngeal Swab  Result Value Ref Range Status   SARS Coronavirus 2 NEGATIVE NEGATIVE Final    Comment: (NOTE) If result is NEGATIVE SARS-CoV-2 target nucleic acids are NOT DETECTED. The SARS-CoV-2 RNA is generally detectable in upper and lower  respiratory specimens during the acute phase of infection. The lowest  concentration of SARS-CoV-2 viral copies this assay can detect is 250  copies / mL. A negative result does not preclude SARS-CoV-2 infection  and should not be used as the sole basis for treatment or other  patient management decisions.  A negative result may occur with  improper  specimen collection / handling, submission of specimen other  than nasopharyngeal swab, presence of viral mutation(s) within the  areas targeted by this assay, and inadequate number of viral copies  (<250 copies / mL). A negative result must be combined with clinical  observations, patient history, and epidemiological information. If result is POSITIVE SARS-CoV-2 target nucleic acids are DETECTED. The SARS-CoV-2 RNA is generally detectable in upper and lower  respiratory specimens dur ing the acute phase of infection.  Positive  results are indicative of active infection with SARS-CoV-2.  Clinical  correlation with patient history and other diagnostic information is  necessary to determine patient infection status.  Positive results do  not rule out bacterial infection or co-infection with other viruses. If result is PRESUMPTIVE POSTIVE SARS-CoV-2 nucleic acids MAY BE PRESENT.   A presumptive positive result was obtained on the submitted specimen  and confirmed on repeat testing.  While 2019 novel coronavirus  (SARS-CoV-2) nucleic acids may be present in the submitted sample  additional confirmatory testing may be necessary for epidemiological  and / or clinical management purposes  to differentiate between  SARS-CoV-2 and other Sarbecovirus currently known to infect humans.  If clinically indicated additional testing with an alternate test  methodology [phone] is advised. The SARS-CoV-2 RNA is generally  detectable in upper and lower respiratory sp ecimens during the acute  phase of infection. The expected result is Negative. Fact Sheet for Patients:  BoilerBrush.com.cyhttps://www.fda.gov/media/136312/download Fact Sheet for Healthcare Providers: https://pope.com/https://www.fda.gov/media/136313/download This test is not yet approved or cleared by the [place] FDA and has been authorized for detection and/or diagnosis of SARS-CoV-2 by FDA under an Emergency Use Authorization (EUA).  This EUA will remain in  effect (meaning this test can be used) for the duration of the COVID-19 declaration under Section 564(b)(1) of the Act, 21 U.S.C. section 360bbb-3(b)(1), unless the authorization is terminated or revoked sooner. Performed at [facility], [address] [place], [place] [address]      Radiological Exams on Admission: Dg Chest Portable 1 View  Result Date: 06/23/2019 CLINICAL DATA:  Shortness of breath.  Asthma.  Obesity. EXAM: PORTABLE CHEST 1 VIEW COMPARISON:  12/23/2017 FINDINGS: Heart size upper limits of normal allowing for technical factors. Question hazy patchy infiltrates in both lungs, particularly the right upper lobe. No dense consolidation, collapse or effusion. No acute bone finding. IMPRESSION: Suspicion of patchy bilateral infiltrates consistent with pneumonia, most notable in the right upper lobe. Electronically Signed   By: Paulina FusiMark  Shogry M.D.   On: 06/23/2019 10:14        Time spent:60 minutes Code Status:   FULL  Family Communication:  No Family at bedside Disposition Plan: expect 2-3 day hospitalization Consults called: none DVT Prophylaxis: [place] Lovenox  Catarina HartshornDavid Christon Gallaway, DO  [facility] Pager [phone]  If 7PM-7AM, please contact night-coverage [email or web address] Password [email or web address] 06/23/2019, 12:51 PM

## 2019-06-23 NOTE — ED Notes (Signed)
Pt is morbidly obese  Mouth breathing   Reports BiPap at night 2L  LS diminished  resp in room

## 2019-06-24 ENCOUNTER — Inpatient Hospital Stay (HOSPITAL_COMMUNITY)

## 2019-06-24 DIAGNOSIS — I5033 Acute on chronic diastolic (congestive) heart failure: Secondary | ICD-10-CM

## 2019-06-24 LAB — LIPID PANEL
Cholesterol: 151 mg/dL (ref 0–200)
HDL: 64 mg/dL (ref >40)
LDL Cholesterol: 78 mg/dL (ref 0–99)
Total CHOL/HDL Ratio: 2.4 RATIO
Triglycerides: 43 mg/dL (ref <150)
VLDL: 9 mg/dL (ref 0–40)

## 2019-06-24 LAB — BASIC METABOLIC PANEL
Anion gap: 11 (ref 5–15)
BUN: 16 mg/dL (ref 6–20)
CO2: 37 mmol/L — ABNORMAL HIGH (ref 22–32)
Calcium: 8.9 mg/dL (ref 8.9–10.3)
Chloride: 95 mmol/L — ABNORMAL LOW (ref 98–111)
Creatinine, Ser: 0.86 mg/dL (ref 0.44–1.00)
GFR calc Af Amer: >60 mL/min (ref >60)
GFR calc non Af Amer: >60 mL/min (ref >60)
Glucose, Bld: 122 mg/dL — ABNORMAL HIGH (ref 70–99)
Potassium: 4.3 mmol/L (ref 3.5–5.1)
Sodium: 143 mmol/L (ref 135–145)

## 2019-06-24 LAB — ECHOCARDIOGRAM COMPLETE
Height: 68 in
Weight: 6246.95 oz

## 2019-06-24 LAB — TROPONIN I (HIGH SENSITIVITY): Troponin I (High Sensitivity): 5 ng/L (ref <18)

## 2019-06-24 MED ORDER — NITROGLYCERIN 0.4 MG SL SUBL
SUBLINGUAL_TABLET | SUBLINGUAL | Status: AC
  Filled 2019-06-24: qty 1

## 2019-06-24 MED ORDER — NITROGLYCERIN 0.4 MG SL SUBL
0.4000 mg | SUBLINGUAL_TABLET | SUBLINGUAL | Status: DC | PRN
  Administered 2019-06-24: 0.4 mg via SUBLINGUAL

## 2019-06-24 MED ORDER — ASPIRIN EC 325 MG PO TBEC
325.0000 mg | DELAYED_RELEASE_TABLET | Freq: Every day | ORAL | Status: DC
  Administered 2019-06-24 – 2019-06-26 (×3): 325 mg via ORAL
  Filled 2019-06-24 (×4): qty 1

## 2019-06-24 MED ORDER — ATORVASTATIN CALCIUM 40 MG PO TABS
80.0000 mg | ORAL_TABLET | Freq: Every day | ORAL | Status: DC
  Filled 2019-06-24: qty 2

## 2019-06-24 NOTE — Progress Notes (Signed)
Xcover C/o chest pain per RN  " chest achiness"  Substernal starting this am , no radiation, pt denies fever, chills, cough, palp, sob beyond her baseline, n/v, abd pain, diarrhea, brbpr.   Relief with nitro per pt  Exam: T 97.7  P 51, Bp 147/86  Pox 97% on RA Heent: anicteric Neck: no jvd Heart: rrr s1, s2 Lung: slight faint exp wheeze, mostly upper respiratory, no crackles.  Abd: morbidly obese, nt, nd, +bs Ext: no c/c,  Trace pedal edema.  Pt c/o chest pain with palpation over sternum  A/P: Chest pain 12 lead ekg stat slg nitro 0.4mg  x1 Trop I q2h x2,  Check lipid Cardiac echo already orderd.  Aspirin 324mg  po x1 Lipitor 80mg  po qhs No b blocker due to bradycardia

## 2019-06-24 NOTE — Progress Notes (Signed)
Patient given Nitro with relief. Denies chest pain or shortness of breath at this time. BP 147/86. Will continue to monitor patient.

## 2019-06-24 NOTE — Progress Notes (Signed)
Patient complaining of new aching, chest pain to mid chest rated at a 7. Denies any radiating pain. Denies shortness of breath. EKG, troponins ordered. Dr. Maudie Mercury notified. New order for Nitro.

## 2019-06-24 NOTE — Progress Notes (Signed)
Patient eating at [place] RT will return to administer nebulizer treatment. Patient stated her breathing felt fine. She appeared in no respiratory distress.

## 2019-06-24 NOTE — Progress Notes (Signed)
2 D echo completed 

## 2019-06-24 NOTE — Progress Notes (Signed)
PROGRESS NOTE  Ann Giles FBP:[id number] DOB: [birth year] DOA: 06/23/2019 PCP: Jettie Booze, NP  Brief History:  48 y.o. female with medical history of dCHF, OSA/OHS, depression, hypertension, GERD, and bronchospasm presenting with 3-day history of shortness of breath that began on 06/20/2019.  Patient has had a nonproductive cough for the same period of time.  She denies any hemoptysis.  She denies any fevers, chills, nausea, vomiting, diarrhea, but she states that she has had some loose stools in the past 24 hours prior to admission.  She denies any recent sick contacts.  She has been complaining of some increase in her lower extremity edema and orthopnea type symptoms for the past 3 days prior to admission.  She stated that she had to buy a wedge from [facility] and had to use it with her pillows to prop herself up at nighttime.  The patient states that she has been watching her fluid intake as well as been compliant with her medications.  She does not weigh herself.  In the emergency department, patient oxygen saturation 87% room air.  She was afebrile hemodynamically stable.  BMP, CBC were unremarkable.  Chest x-ray showed increased interstitial markings and possible patchy infiltrate in the right upper lobe.  The patient was given ceftriaxone, azithromycin, Solu-Medrol, and duo nebs in the emergency department.  She was stable on 3 L nasal cannula with saturation 92-94%.  Assessment/Plan: Acute respiratory failure with hypoxia -presently stable on 3 L -7/4 CT chest--patchy consolidation RUL with small air bronchograms; GGO and patchy nodular foci of consolidation bilateral lungs -Multifactorial including CHF, bronchospasm and underlying OSA/OHS  -suspect GGO more related to edema/possible ILD rather than infx as PCT <0.10 and pt without leukocytosis nor fever -suspect component of edema>>>infection as pt has had significant improvement in only 24 hours  Pulmonary  infiltrates -doubt pneumonia as pt is afebrile without leukocytosis -check PCT <0.10 -CT chest as discussed above -SARS-CoV2--neg -will need pulmonary eval after discharge  Acute on chronic diastolic CHF -Continue IV furosemide twice daily -Daily weights -Echo EF 60-65%, dilated IVC, trivial TR -12/24/2017 echo EF 82-42%,[id number] diastolic dysfunction, no WMA  Bronchospasm -10/16/2017PFTs--showed diffusion defect and interstitial process without airflow obstruction. There was no bronchodilator response -continue BDs  OSA -compliant with CPAP at home -CPAP during hospitalization  Essential hypertension -Continue amlodipineand lisinopril  Depression -Continue Zoloft  Lower Extremity Edema and pain -venous duplex--neg for DVT  Morbid Obesity -BMI 59.15  Atypical Chest Pain -hs-troponin 7>>>5 -personally reviewed EKG--sinus, nonspecific T wave changes       Disposition Plan:   Home 7/6 or 7/7 Family Communication:  No Family at bedside  Consultants:  none  Code Status:  FULL  DVT Prophylaxis:  Anza Lovenox   Procedures: As Listed in Progress Note Above  Antibiotics: Ceftriaxone 7/4>>> azithro 7/4>>>       Subjective: Pt states she is breathing 50% better.  Patient denies fevers, chills, headache, dyspnea, nausea, vomiting, diarrhea, abdominal pain, dysuria, hematuria, hematochezia, and melena.   Objective: Vitals:   06/24/19 0608 06/24/19 0952 06/24/19 1406 06/24/19 1438  BP: (!) 147/86   (!) 148/81  Pulse: (!) 51   67  Resp:    18  Temp:    98.1 F (36.7 C)  TempSrc:    Oral  SpO2: 97% 97% 97% 95%  Weight:      Height:        Intake/Output Summary (Last 24 hours) at  06/24/2019 1444 Last data filed at 06/24/2019 0745 Gross per 24 hour  Intake 732.17 ml  Output 1900 ml  Net -1167.83 ml   Weight change:  Exam:   General:  Pt is alert, follows commands appropriately, not in acute distress  HEENT: No icterus, No thrush, No  neck mass, [place]/AT  Cardiovascular: RRR, S1/S2, no rubs, no gallops  Respiratory: mild bibasilar rales. No wheeze  Abdomen: Soft/+BS, non tender, non distended, no guarding  Extremities: 1+ LE edema, No lymphangitis, No petechiae, No rashes, no synovitis   Data Reviewed: I have personally reviewed following labs and imaging studies Basic Metabolic Panel: Recent Labs  Lab 06/23/19 0946 06/24/19 0705  NA 145 143  K 4.1 4.3  CL 100 95*  CO2 35* 37*  GLUCOSE 96 122*  BUN 11 16  CREATININE 0.97 0.86  CALCIUM 8.8* 8.9   Liver Function Tests: No results for input(s): AST, ALT, ALKPHOS, BILITOT, PROT, ALBUMIN in the last 168 hours. No results for input(s): LIPASE, AMYLASE in the last 168 hours. No results for input(s): AMMONIA in the last 168 hours. Coagulation Profile: No results for input(s): INR, PROTIME in the last 168 hours. CBC: Recent Labs  Lab 06/23/19 0946  WBC 7.1  NEUTROABS 5.1  HGB 11.7*  HCT 41.3  MCV 102.7*  PLT 233   Cardiac Enzymes: No results for input(s): CKTOTAL, CKMB, CKMBINDEX, TROPONINI in the last 168 hours. BNP: Invalid input(s): POCBNP CBG: No results for input(s): GLUCAP in the last 168 hours. HbA1C: No results for input(s): HGBA1C in the last 72 hours. Urine analysis:    Component Value Date/Time   COLORURINE YELLOW 03/12/2018 1750   APPEARANCEUR CLEAR 03/12/2018 1750   LABSPEC 1.020 03/12/2018 1750   PHURINE 5.0 03/12/2018 1750   GLUCOSEU NEGATIVE 03/12/2018 1750   HGBUR SMALL (A) 03/12/2018 1750   BILIRUBINUR NEGATIVE 03/12/2018 1750   KETONESUR NEGATIVE 03/12/2018 1750   PROTEINUR NEGATIVE 03/12/2018 1750   NITRITE NEGATIVE 03/12/2018 1750   LEUKOCYTESUR NEGATIVE 03/12/2018 1750   Sepsis Labs: @LABRCNTIP (procalcitonin:4,lacticidven:4) ) Recent Results (from the past 240 hour(s))  SARS Coronavirus 2 (CEPHEID- Performed in [facility]), Hosp Order     Status: None   Collection Time: 06/23/19  9:48 AM   Specimen:  Nasopharyngeal Swab  Result Value Ref Range Status   SARS Coronavirus 2 NEGATIVE NEGATIVE Final    Comment: (NOTE) If result is NEGATIVE SARS-CoV-2 target nucleic acids are NOT DETECTED. The SARS-CoV-2 RNA is generally detectable in upper and lower  respiratory specimens during the acute phase of infection. The lowest  concentration of SARS-CoV-2 viral copies this assay can detect is 250  copies / mL. A negative result does not preclude SARS-CoV-2 infection  and should not be used as the sole basis for treatment or other  patient management decisions.  A negative result may occur with  improper specimen collection / handling, submission of specimen other  than nasopharyngeal swab, presence of viral mutation(s) within the  areas targeted by this assay, and inadequate number of viral copies  (<250 copies / mL). A negative result must be combined with clinical  observations, patient history, and epidemiological information. If result is POSITIVE SARS-CoV-2 target nucleic acids are DETECTED. The SARS-CoV-2 RNA is generally detectable in upper and lower  respiratory specimens dur ing the acute phase of infection.  Positive  results are indicative of active infection with SARS-CoV-2.  Clinical  correlation with patient history and other diagnostic information is  necessary to determine  patient infection status.  Positive results do  not rule out bacterial infection or co-infection with other viruses. If result is PRESUMPTIVE POSTIVE SARS-CoV-2 nucleic acids MAY BE PRESENT.   A presumptive positive result was obtained on the submitted specimen  and confirmed on repeat testing.  While 2019 novel coronavirus  (SARS-CoV-2) nucleic acids may be present in the submitted sample  additional confirmatory testing may be necessary for epidemiological  and / or clinical management purposes  to differentiate between  SARS-CoV-2 and other Sarbecovirus currently known to infect humans.  If clinically  indicated additional testing with an alternate test  methodology [phone] is advised. The SARS-CoV-2 RNA is generally  detectable in upper and lower respiratory sp ecimens during the acute  phase of infection. The expected result is Negative. Fact Sheet for Patients:  BoilerBrush.com.cyhttps://www.fda.gov/media/136312/download Fact Sheet for Healthcare Providers: https://pope.com/https://www.fda.gov/media/136313/download This test is not yet approved or cleared by the [place] FDA and has been authorized for detection and/or diagnosis of SARS-CoV-2 by FDA under an Emergency Use Authorization (EUA).  This EUA will remain in effect (meaning this test can be used) for the duration of the COVID-19 declaration under Section 564(b)(1) of the Act, 21 U.S.C. section 360bbb-3(b)(1), unless the authorization is terminated or revoked sooner. Performed at [facility], [address] [place], [place] [address]      Scheduled Meds:  amLODipine  10 mg Oral Daily   aspirin EC  325 mg Oral Daily   atorvastatin  80 mg Oral q1800   azithromycin  500 mg Oral Daily   budesonide (PULMICORT) nebulizer solution  0.5 mg Nebulization BID   enoxaparin (LOVENOX) injection  90 mg Subcutaneous Q24H   furosemide  40 mg Intravenous BID   ipratropium-albuterol  3 mL Nebulization Q6H   lisinopril  2.5 mg Oral Daily   pantoprazole  40 mg Oral Daily   sertraline  100 mg Oral Daily   sodium chloride flush  3 mL Intravenous Q12H   Continuous Infusions:  sodium chloride     sodium chloride 250 mL (06/23/19 1510)   cefTRIAXone (ROCEPHIN)  IV 1 g (06/24/19 0953)    Procedures/Studies: Ct Chest Wo Contrast  Result Date: 06/23/2019 CLINICAL DATA:  Chest pain and worsening dyspnea and cough. EXAM: CT CHEST WITHOUT CONTRAST TECHNIQUE: Multidetector CT imaging of the chest was performed following the standard protocol without IV contrast. COMPARISON:  Chest radiograph from earlier today. FINDINGS: Cardiovascular: Top-normal heart  size. No significant pericardial effusion/thickening. Normal course and caliber of the thoracic aorta. Dilated main pulmonary artery (3.9 cm diameter). Mediastinum/Nodes: No discrete thyroid nodules. Unremarkable esophagus. No pathologically enlarged axillary, mediastinal or hilar lymph nodes, noting limited sensitivity for the detection of hilar adenopathy on this noncontrast study. Lungs/Pleura: No pneumothorax. No pleural effusion. There is patchy consolidation in the right upper lobe with a few small air bronchograms. Minimal patchy nodular foci of consolidation in remaining lung lobes bilaterally. There is patchy ground-glass opacity throughout both lungs involving all lung lobes. No lung masses or significant pulmonary nodules. No central airway stenosis. Upper abdomen: Partially visualized postsurgical changes in the proximal stomach. Mild diffuse hepatic steatosis. Cholecystectomy. Musculoskeletal: No aggressive appearing focal osseous lesions. Moderate thoracic spondylosis. IMPRESSION: 1. Patchy consolidation and ground-glass opacity throughout both lungs, most prominent in the right upper lobe, compatible with multilobar pneumonia. There are a spectrum of findings in the lungs which can be seen with acute atypical infection (as well as other non-infectious etiologies). In particular, viral pneumonia (including COVID-19) should be  considered in the appropriate clinical setting. 2. Dilated main pulmonary artery, suggesting pulmonary arterial hypertension. 3. Mild diffuse hepatic steatosis. Electronically Signed   By: Delbert PhenixJason A Poff M.D.   On: 06/23/2019 16:32   [place] Venous Img Lower Bilateral  Result Date: 06/24/2019 CLINICAL DATA:  48 year old female with bilateral chronic lower extremity pain EXAM: BILATERAL LOWER EXTREMITY VENOUS DOPPLER ULTRASOUND TECHNIQUE: Gray-scale sonography with graded compression, as well as color Doppler and duplex ultrasound were performed to evaluate the lower extremity deep  venous systems from the level of the common femoral vein and including the common femoral, femoral, profunda femoral, popliteal and calf veins including the posterior tibial, peroneal and gastrocnemius veins when visible. The superficial great saphenous vein was also interrogated. Spectral Doppler was utilized to evaluate flow at rest and with distal augmentation maneuvers in the common femoral, femoral and popliteal veins. COMPARISON:  None. FINDINGS: RIGHT LOWER EXTREMITY Common Femoral Vein: No evidence of thrombus. Normal compressibility, respiratory phasicity and response to augmentation. Saphenofemoral Junction: No evidence of thrombus. Normal compressibility and flow on color Doppler imaging. Profunda Femoral Vein: No evidence of thrombus. Normal compressibility and flow on color Doppler imaging. Femoral Vein: No evidence of thrombus. Normal compressibility, respiratory phasicity and response to augmentation. Popliteal Vein: No evidence of thrombus. Normal compressibility, respiratory phasicity and response to augmentation. Calf Veins: No evidence of thrombus. Normal compressibility and flow on color Doppler imaging. Superficial Great Saphenous Vein: No evidence of thrombus. Normal compressibility. Venous Reflux:  None. Other Findings:  None. LEFT LOWER EXTREMITY Common Femoral Vein: No evidence of thrombus. Normal compressibility, respiratory phasicity and response to augmentation. Saphenofemoral Junction: No evidence of thrombus. Normal compressibility and flow on color Doppler imaging. Profunda Femoral Vein: No evidence of thrombus. Normal compressibility and flow on color Doppler imaging. Femoral Vein: No evidence of thrombus. Normal compressibility, respiratory phasicity and response to augmentation. Popliteal Vein: No evidence of thrombus. Normal compressibility, respiratory phasicity and response to augmentation. Calf Veins: No evidence of thrombus. Normal compressibility and flow on color Doppler  imaging. Superficial Great Saphenous Vein: No evidence of thrombus. Normal compressibility. Venous Reflux:  None. Other Findings:  None. IMPRESSION: No evidence of deep venous thrombosis in either lower extremity. Electronically Signed   By: Malachy MoanHeath  McCullough M.D.   On: 06/24/2019 10:38   Dg Chest Portable 1 View  Result Date: 06/23/2019 CLINICAL DATA:  Shortness of breath.  Asthma.  Obesity. EXAM: PORTABLE CHEST 1 VIEW COMPARISON:  12/23/2017 FINDINGS: Heart size upper limits of normal allowing for technical factors. Question hazy patchy infiltrates in both lungs, particularly the right upper lobe. No dense consolidation, collapse or effusion. No acute bone finding. IMPRESSION: Suspicion of patchy bilateral infiltrates consistent with pneumonia, most notable in the right upper lobe. Electronically Signed   By: Paulina FusiMark  Shogry M.D.   On: 06/23/2019 10:14    Catarina HartshornDavid Phi Avans, DO  [facility] Pager [phone]  If 7PM-7AM, please contact night-coverage [email or web address] Password [email or web address] 06/24/2019, 2:44 PM   LOS: 1 day

## 2019-06-25 LAB — MAGNESIUM: Magnesium: 1.7 mg/dL (ref 1.7–2.4)

## 2019-06-25 LAB — RESPIRATORY PANEL BY PCR

## 2019-06-25 LAB — CBC
HCT: 41.2 % (ref 36.0–46.0)
Hemoglobin: 11.8 g/dL — ABNORMAL LOW (ref 12.0–15.0)
MCH: 28.7 pg (ref 26.0–34.0)
MCHC: 28.6 g/dL — ABNORMAL LOW (ref 30.0–36.0)
MCV: 100.2 fL — ABNORMAL HIGH (ref 80.0–100.0)
Platelets: 252 10*3/uL (ref 150–400)
RBC: 4.11 MIL/uL (ref 3.87–5.11)
RDW: 15.3 % (ref 11.5–15.5)
WBC: 7.9 10*3/uL (ref 4.0–10.5)
nRBC: 0 % (ref 0.0–0.2)

## 2019-06-25 LAB — BASIC METABOLIC PANEL
Anion gap: 11 (ref 5–15)
BUN: 21 mg/dL — ABNORMAL HIGH (ref 6–20)
CO2: 38 mmol/L — ABNORMAL HIGH (ref 22–32)
Calcium: 9.1 mg/dL (ref 8.9–10.3)
Chloride: 92 mmol/L — ABNORMAL LOW (ref 98–111)
Creatinine, Ser: 0.97 mg/dL (ref 0.44–1.00)
GFR calc Af Amer: >60 mL/min (ref >60)
GFR calc non Af Amer: >60 mL/min (ref >60)
Glucose, Bld: 87 mg/dL (ref 70–99)
Potassium: 3.6 mmol/L (ref 3.5–5.1)
Sodium: 141 mmol/L (ref 135–145)

## 2019-06-25 MED ORDER — IPRATROPIUM-ALBUTEROL 0.5-2.5 (3) MG/3ML IN SOLN
3.0000 mL | Freq: Three times a day (TID) | RESPIRATORY_TRACT | Status: DC
  Administered 2019-06-25 – 2019-06-26 (×5): 3 mL via RESPIRATORY_TRACT
  Filled 2019-06-25 (×5): qty 3

## 2019-06-25 MED ORDER — MAGNESIUM SULFATE 2 GM/50ML IV SOLN
2.0000 g | Freq: Once | INTRAVENOUS | Status: AC
  Administered 2019-06-25: 18:00:00 2 g via INTRAVENOUS
  Filled 2019-06-25: qty 50

## 2019-06-25 MED ORDER — FUROSEMIDE 40 MG PO TABS
40.0000 mg | ORAL_TABLET | Freq: Every day | ORAL | Status: DC
  Administered 2019-06-26: 40 mg via ORAL
  Filled 2019-06-25: qty 1

## 2019-06-25 NOTE — TOC Initial Note (Signed)
Transition of Care [facility]) - Initial/Assessment Note    Patient Details  Name: Ann Giles MRN: [id number] Date of Birth: 1971/11/25  Transition of Care [facility]) CM/SW Contact:    Alexandar Weisenberger, Chauncey Reading, RN Phone Number: 06/25/2019, 11:39 AM  Clinical Narrative:     Admit with CHF/acute respiratory failure with hypoxia.  From home alone, has mother for support. Works, independent. Patient reports she does have diet indiscretions, eats take out food a lot and does not weigh daily. She has a [employer] scale that she does not know how to use and plans to buy a different scale. She reports she follows with Dr. Shirlee More, (cardiologist)last seen him in February. Dietician consulted for diet education.  Has PCP. Drives to her appointments. Has no issues obtaining medications.  Reports she has continuous oxygen setup and CPAP with [employer].  Patient will be referred for pulmonary follow up.     Expected Discharge Plan: Home/Self Care Barriers to Discharge: No Barriers Identified   Patient Goals and CMS Choice Patient states their goals for this hospitalization and ongoing recovery are:: breath easier and get back home      Expected Discharge Plan and Services Expected Discharge Plan: Home/Self Care                                              Prior Living Arrangements/Services   Lives with:: Self Patient language and need for interpreter reviewed:: Yes Do you feel safe going back to the place where you live?: Yes      Need for Family Participation in Patient Care: Yes (Comment) Care giver support system in place?: Yes (comment) Current home services: DME(cpap and oxygen) Criminal Activity/Legal Involvement Pertinent to Current Situation/Hospitalization: No - Comment as needed  Activities of Daily Living Home Assistive Devices/Equipment: Cane (specify quad or straight) ADL Screening (condition at time of admission) Patient's cognitive ability adequate to safely complete  daily activities?: Yes Is the patient deaf or have difficulty hearing?: No Does the patient have difficulty seeing, even when wearing glasses/contacts?: No Does the patient have difficulty concentrating, remembering, or making decisions?: Yes Patient able to express need for assistance with ADLs?: Yes Does the patient have difficulty dressing or bathing?: No Independently performs ADLs?: Yes (appropriate for developmental age) Does the patient have difficulty walking or climbing stairs?: Yes Weakness of Legs: Both Weakness of Arms/Hands: Both  Permission Sought/Granted                  Emotional Assessment   Attitude/Demeanor/Rapport: Engaged Affect (typically observed): Accepting Orientation: : Oriented to Self, Oriented to Place, Oriented to  Time, Oriented to Situation Alcohol / Substance Use: Not Applicable Psych Involvement: No (comment)  Admission diagnosis:  Hypoxia [R09.02] Community acquired pneumonia, unspecified laterality [J18.9] Patient Active Problem List   Diagnosis Date Noted  . Acute on chronic diastolic CHF (congestive heart failure) ([place]) 06/23/2019  . Acute diastolic CHF (congestive heart failure) ([place]) 12/23/2017  . Obesity, Class III, BMI 40-49.9 (morbid obesity) ([place]) 12/23/2017  . Acute bronchospasm 09/09/2016  . Asthma 09/09/2016  . Hypertension 09/09/2016  . Depression 09/09/2016  . Leg swelling 09/09/2016  . Acute respiratory failure with hypoxia ([place]) 09/09/2016  . Bronchospasm, acute 09/09/2016   PCP:  Jettie Booze, NP Pharmacy:   [facility] [place], [place]  [address] [place] [id number] Phone: [phone] Fax: [phone]     Social Determinants of Health (SDOH) Interventions    Readmission Risk Interventions Readmission Risk Prevention Plan 06/25/2019  Medication Screening Complete  Transportation Screening Complete  Some recent data might be  hidden

## 2019-06-25 NOTE — Progress Notes (Signed)
PROGRESS NOTE  Ann Giles [id number]:[id number] DOB: May 15, 1971 DOA: 06/23/2019 PCP: April MansonWhite, Marsha L, NP  Brief History:  48 y.o.femalewith medical history ofdCHF, OSA/OHS,depression, hypertension, GERD, and bronchospasmpresenting with 3-day history of shortness of breath that began on 06/20/2019. Patient has had a nonproductive cough for the same period of time. She denies any hemoptysis. She denies any fevers, chills, nausea, vomiting, diarrhea, but she states that she has had some loose stools in the past 24 hours prior to admission. She denies any recent sick contacts. She has been complaining of some increase in her lower extremity edema and orthopnea type symptoms for the past 3 days prior to admission. She stated that [facility] and had to use it with her pillows to prop herself up at nighttime. The patient states that she has been watching her fluid intake as well as been compliant with her medications. She does not weigh herself. In the emergency department, patient oxygen saturation 87% room air. She was afebrile hemodynamically stable. BMP, CBC were unremarkable. Chest x-ray showed increased interstitial markings and possible patchy infiltrate in the right upper lobe. The patient was given ceftriaxone, azithromycin, Solu-Medrol, and duo nebs in the emergency department. She was stable on 3 L nasal cannula with saturation 92-94%.  Assessment/Plan: Acute respiratory failure with hypoxia -presently stable on 2 L -7/4 CT chest--patchy consolidation RUL with small air bronchograms; GGO and patchy nodular foci of consolidation bilateral lungs -Multifactorial including CHF, bronchospasm and underlying OSA/OHS  -suspect GGO more related to edema/possible ILD rather than infx as PCT <0.10 and pt without leukocytosis nor fever -suspect component of edema>>>infection as pt has had significant improvement in only 24 hours with  furosemide  Pulmonary infiltrates -doubt pneumonia as pt is afebrile without leukocytosis -check PCT <0.10 -CT chest as discussed above -SARS-CoV2--neg -will need pulmonary eval after discharge  Acute on chronic diastolic CHF -Continue IV furosemide twice daily -Daily weights 407.1>>>391.3 -Echo EF 60-65%, dilated IVC, trivial TR -12/24/2017 echo EF 65-70%,indeterminant diastolic dysfunction, no WMA  Bronchospasm -10/16/2017PFTs--showed diffusion defect and interstitial process without airflow obstruction. There was no bronchodilator response -continue BDs -improved with diuresis  OSA -compliant with CPAP at home -CPAP during hospitalization  Essential hypertension -Continue amlodipineand lisinopril  Depression -Continue Zoloft  Lower Extremity Edema and pain -venous duplex--neg for DVT  Morbid Obesity -BMI 59.15  Atypical Chest Pain -hs-troponin 7>>>5 -personally reviewed EKG--sinus, nonspecific T wave changes       Disposition Plan:   Home 7/7 if stable Family Communication:  No Family at bedside  Consultants:  none  Code Status:  FULL  DVT Prophylaxis:  [place] Lovenox   Procedures: As Listed in Progress Note Above  Antibiotics: Ceftriaxone 7/4>>> azithro 7/4>>>      Subjective: Pt is breathing better, but still has some dyspnea on exertion.  Denies cp, n/vd, abd pain, f/c, headache.    Objective: Vitals:   06/24/19 2328 06/25/19 0700 06/25/19 0759 06/25/19 1301  BP: (!) 157/84 140/70  (!) 158/74  Pulse: 61 70  62  Resp:    (!) 21  Temp: 97.8 F (36.6 C) 98.1 F (36.7 C)  98.1 F (36.7 C)  TempSrc: Oral Oral  Oral  SpO2: 97% 96% 100% 92%  Weight:  (!) 179.8 kg    Height:        Intake/Output Summary (Last 24 hours) at 06/25/2019 1645 Last data filed at 06/25/2019 1500 Gross per  24 hour  Intake 600 ml  Output 5650 ml  Net -5050 ml   Weight change: 3.311 kg Exam:   General:  Pt is alert, follows commands  appropriately, not in acute distress  HEENT: No icterus, No thrush, No neck mass, [place]/AT  Cardiovascular: RRR, S1/S2, no rubs, no gallops  Respiratory: bibasilar rales. No wheeze  Abdomen: Soft/+BS, non tender, non distended, no guarding  Extremities: 1 + LE edema, No lymphangitis, No petechiae, No rashes, no synovitis   Data Reviewed: I have personally reviewed following labs and imaging studies Basic Metabolic Panel: Recent Labs  Lab 06/23/19 0946 06/24/19 0705 06/25/19 0609  NA 145 143 141  K 4.1 4.3 3.6  CL 100 95* 92*  CO2 35* 37* 38*  GLUCOSE 96 122* 87  BUN 11 16 21*  CREATININE 0.97 0.86 0.97  CALCIUM 8.8* 8.9 9.1  MG  --   --  1.7   Liver Function Tests: No results for input(s): AST, ALT, ALKPHOS, BILITOT, PROT, ALBUMIN in the last 168 hours. No results for input(s): LIPASE, AMYLASE in the last 168 hours. No results for input(s): AMMONIA in the last 168 hours. Coagulation Profile: No results for input(s): INR, PROTIME in the last 168 hours. CBC: Recent Labs  Lab 06/23/19 0946 06/25/19 0609  WBC 7.1 7.9  NEUTROABS 5.1  --   HGB 11.7* 11.8*  HCT 41.3 41.2  MCV 102.7* 100.2*  PLT 233 252   Cardiac Enzymes: No results for input(s): CKTOTAL, CKMB, CKMBINDEX, TROPONINI in the last 168 hours. BNP: Invalid input(s): POCBNP CBG: No results for input(s): GLUCAP in the last 168 hours. HbA1C: No results for input(s): HGBA1C in the last 72 hours. Urine analysis:    Component Value Date/Time   COLORURINE YELLOW 03/12/2018 1750   APPEARANCEUR CLEAR 03/12/2018 1750   LABSPEC 1.020 03/12/2018 1750   PHURINE 5.0 03/12/2018 1750   GLUCOSEU NEGATIVE 03/12/2018 1750   HGBUR SMALL (A) 03/12/2018 1750   BILIRUBINUR NEGATIVE 03/12/2018 1750   KETONESUR NEGATIVE 03/12/2018 1750   PROTEINUR NEGATIVE 03/12/2018 1750   NITRITE NEGATIVE 03/12/2018 1750   LEUKOCYTESUR NEGATIVE 03/12/2018 1750   Sepsis Labs: @LABRCNTIP (procalcitonin:4,lacticidven:4) ) Recent  Results (from the past 240 hour(s))  SARS Coronavirus 2 (CEPHEID- [facility]), Hosp Order     Status: None   Collection Time: 06/23/19  9:48 AM   Specimen: Nasopharyngeal Swab  Result Value Ref Range Status   SARS Coronavirus 2 NEGATIVE NEGATIVE Final    Comment: (NOTE) If result is NEGATIVE SARS-CoV-2 target nucleic acids are NOT DETECTED. The SARS-CoV-2 RNA is generally detectable in upper and lower  respiratory specimens during the acute phase of infection. The lowest  concentration of SARS-CoV-2 viral copies this assay can detect is 250  copies / mL. A negative result does not preclude SARS-CoV-2 infection  and should not be used as the sole basis for treatment or other  patient management decisions.  A negative result may occur with  improper specimen collection / handling, submission of specimen other  than nasopharyngeal swab, presence of viral mutation(s) within the  areas targeted by this assay, and inadequate number of viral copies  (<250 copies / mL). A negative result must be combined with clinical  observations, patient history, and epidemiological information. If result is POSITIVE SARS-CoV-2 target nucleic acids are DETECTED. The SARS-CoV-2 RNA is generally detectable in upper and lower  respiratory specimens dur ing the acute phase of infection.  Positive  results are indicative of  active infection with SARS-CoV-2.  Clinical  correlation with patient history and other diagnostic information is  necessary to determine patient infection status.  Positive results do  not rule out bacterial infection or co-infection with other viruses. If result is PRESUMPTIVE POSTIVE SARS-CoV-2 nucleic acids MAY BE PRESENT.   A presumptive positive result was obtained on the submitted specimen  and confirmed on repeat testing.  While 2019 novel coronavirus  (SARS-CoV-2) nucleic acids may be present in the submitted sample  additional confirmatory testing may  be necessary for epidemiological  and / or clinical management purposes  to differentiate between  SARS-CoV-2 and other Sarbecovirus currently known to infect humans.  If clinically indicated additional testing with an alternate test  methodology [id number]) is advised. The SARS-CoV-2 RNA is generally  detectable in upper and lower respiratory sp ecimens during the acute  phase of infection. The expected result is Negative. Fact Sheet for Patients:  StrictlyIdeas.no Fact Sheet for Healthcare Providers: BankingDealers.co.za This test is not yet approved or cleared by the [place] FDA and has been authorized for detection and/or diagnosis of SARS-CoV-2 by FDA under an Emergency Use Authorization (EUA).  This EUA will remain in effect (meaning this test can be used) for the duration of the COVID-19 declaration under Section 564(b)(1) of the Act, 21 U.S.C. section 360bbb-3(b)(1), unless the authorization is terminated or revoked sooner. Performed at [facility], [address] [place], [place] [address]      Scheduled Meds:  amLODipine  10 mg Oral Daily   aspirin EC  325 mg Oral Daily   atorvastatin  80 mg Oral q1800   azithromycin  500 mg Oral Daily   budesonide (PULMICORT) nebulizer solution  0.5 mg Nebulization BID   enoxaparin (LOVENOX) injection  90 mg Subcutaneous Q24H   furosemide  40 mg Intravenous BID   ipratropium-albuterol  3 mL Nebulization TID   lisinopril  2.5 mg Oral Daily   pantoprazole  40 mg Oral Daily   sertraline  100 mg Oral Daily   sodium chloride flush  3 mL Intravenous Q12H   Continuous Infusions:  sodium chloride     sodium chloride 250 mL (06/23/19 1510)   cefTRIAXone (ROCEPHIN)  IV 1 g (06/25/19 0841)    Procedures/Studies: Ct Chest Wo Contrast  Result Date: 06/23/2019 CLINICAL DATA:  Chest pain and worsening dyspnea and cough. EXAM: CT CHEST WITHOUT CONTRAST TECHNIQUE: Multidetector  CT imaging of the chest was performed following the standard protocol without IV contrast. COMPARISON:  Chest radiograph from earlier today. FINDINGS: Cardiovascular: Top-normal heart size. No significant pericardial effusion/thickening. Normal course and caliber of the thoracic aorta. Dilated main pulmonary artery (3.9 cm diameter). Mediastinum/Nodes: No discrete thyroid nodules. Unremarkable esophagus. No pathologically enlarged axillary, mediastinal or hilar lymph nodes, noting limited sensitivity for the detection of hilar adenopathy on this noncontrast study. Lungs/Pleura: No pneumothorax. No pleural effusion. There is patchy consolidation in the right upper lobe with a few small air bronchograms. Minimal patchy nodular foci of consolidation in remaining lung lobes bilaterally. There is patchy ground-glass opacity throughout both lungs involving all lung lobes. No lung masses or significant pulmonary nodules. No central airway stenosis. Upper abdomen: Partially visualized postsurgical changes in the proximal stomach. Mild diffuse hepatic steatosis. Cholecystectomy. Musculoskeletal: No aggressive appearing focal osseous lesions. Moderate thoracic spondylosis. IMPRESSION: 1. Patchy consolidation and ground-glass opacity throughout both lungs, most prominent in the right upper lobe, compatible with multilobar pneumonia. There are a spectrum of findings in the lungs which can  be seen with acute atypical infection (as well as other non-infectious etiologies). In particular, viral pneumonia (including COVID-19) should be considered in the appropriate clinical setting. 2. Dilated main pulmonary artery, suggesting pulmonary arterial hypertension. 3. Mild diffuse hepatic steatosis. Electronically Signed   By: Delbert PhenixJason A Poff M.D.   On: 06/23/2019 16:32   [place] Venous Img Lower Bilateral  Result Date: 06/24/2019 CLINICAL DATA:  48 year old female with bilateral chronic lower extremity pain EXAM: BILATERAL LOWER EXTREMITY  VENOUS DOPPLER ULTRASOUND TECHNIQUE: Gray-scale sonography with graded compression, as well as color Doppler and duplex ultrasound were performed to evaluate the lower extremity deep venous systems from the level of the common femoral vein and including the common femoral, femoral, profunda femoral, popliteal and calf veins including the posterior tibial, peroneal and gastrocnemius veins when visible. The superficial great saphenous vein was also interrogated. Spectral Doppler was utilized to evaluate flow at rest and with distal augmentation maneuvers in the common femoral, femoral and popliteal veins. COMPARISON:  None. FINDINGS: RIGHT LOWER EXTREMITY Common Femoral Vein: No evidence of thrombus. Normal compressibility, respiratory phasicity and response to augmentation. Saphenofemoral Junction: No evidence of thrombus. Normal compressibility and flow on color Doppler imaging. Profunda Femoral Vein: No evidence of thrombus. Normal compressibility and flow on color Doppler imaging. Femoral Vein: No evidence of thrombus. Normal compressibility, respiratory phasicity and response to augmentation. Popliteal Vein: No evidence of thrombus. Normal compressibility, respiratory phasicity and response to augmentation. Calf Veins: No evidence of thrombus. Normal compressibility and flow on color Doppler imaging. Superficial Great Saphenous Vein: No evidence of thrombus. Normal compressibility. Venous Reflux:  None. Other Findings:  None. LEFT LOWER EXTREMITY Common Femoral Vein: No evidence of thrombus. Normal compressibility, respiratory phasicity and response to augmentation. Saphenofemoral Junction: No evidence of thrombus. Normal compressibility and flow on color Doppler imaging. Profunda Femoral Vein: No evidence of thrombus. Normal compressibility and flow on color Doppler imaging. Femoral Vein: No evidence of thrombus. Normal compressibility, respiratory phasicity and response to augmentation. Popliteal Vein: No  evidence of thrombus. Normal compressibility, respiratory phasicity and response to augmentation. Calf Veins: No evidence of thrombus. Normal compressibility and flow on color Doppler imaging. Superficial Great Saphenous Vein: No evidence of thrombus. Normal compressibility. Venous Reflux:  None. Other Findings:  None. IMPRESSION: No evidence of deep venous thrombosis in either lower extremity. Electronically Signed   By: Malachy MoanHeath  McCullough M.D.   On: 06/24/2019 10:38   Dg Chest Portable 1 View  Result Date: 06/23/2019 CLINICAL DATA:  Shortness of breath.  Asthma.  Obesity. EXAM: PORTABLE CHEST 1 VIEW COMPARISON:  12/23/2017 FINDINGS: Heart size upper limits of normal allowing for technical factors. Question hazy patchy infiltrates in both lungs, particularly the right upper lobe. No dense consolidation, collapse or effusion. No acute bone finding. IMPRESSION: Suspicion of patchy bilateral infiltrates consistent with pneumonia, most notable in the right upper lobe. Electronically Signed   By: Paulina FusiMark  Shogry M.D.   On: 06/23/2019 10:14    Catarina HartshornDavid Lloyd Cullinan, DO  [facility] Pager [phone]  If 7PM-7AM, please contact night-coverage [email or web address] Password [email or web address] 06/25/2019, 4:45 PM   LOS: 2 days

## 2019-06-25 NOTE — Progress Notes (Signed)
Nutrition Education Note  RD consulted for nutrition education regarding acute onset CHF. Patient lives alone and works third shift. She eats out daily and gets set on certain restaurants. Currently it is daily [employer]- chicken, mashed potatoes and biscuit. There is a refrigerator at her workplace and we talked about alternate meal options that would still be tasty and convenient.  RD provided "Heart Failure Nutrition Therapy" handout from the Academy of Nutrition and Dietetics. Reviewed patient's dietary recall. Provided examples on ways to decrease sodium intake in diet.   Discouraged intake of processed foods and use of salt shaker. Patient has been receiving a Ms Deliah Boston type herb option with her meals here and likes the flavor. Encouraged her to purchase some for home use.  Encouraged fresh fruits and vegetables as well as whole grain sources of carbohydrates to maximize fiber intake.   RD discussed why it is important for patient to adhere to diet recommendations, and emphasized the role of fluids, foods to avoid, and importance of weighing self daily. Teach back method used.  Expect fair compliance. Patient is very pleasant and receptive to education however on multiple occasions during our conversation expresses concern about being able to break the eating out every day habit. We talked about multiple other food options that are relatively easy to purchase and minimal preparation as alternatives to her current [employer] chicken and picnic size mash potatoes biscuit daily. Patient like to drink minute maid lemonade. Suggested she try the diet minute maid or sugar free flavor packets.  Body mass index is 60.26 kg/m. Pt meets criteria for morbid obesity based on current BMI.  Labs and medications reviewed. Lipitor,Lasix, Protonix BMP Latest Ref Rng & Units 06/25/2019 06/24/2019 06/23/2019  Glucose 70 - 99 mg/dL 87 122(H) 96  BUN 6 - 20 mg/dL 21(H) 16 11  Creatinine 0.44 - 1.00 mg/dL 0.97 0.86 0.97   Sodium 135 - 145 mmol/L 141 143 145  Potassium 3.5 - 5.1 mmol/L 3.6 4.3 4.1  Chloride 98 - 111 mmol/L 92(L) 95(L) 100  CO2 22 - 32 mmol/L 38(H) 37(H) 35(H)  Calcium 8.9 - 10.3 mg/dL 9.1 8.9 8.8(L)    Plan: Long--term patient would benefit from reducing her overall weight given her obese state. If she makes a few of the lifestyle/ habit changes we talked about today there will likely be a calorie deficit created and some weight loss occur.   A follow up with and outpatient RD could be very helpful for her to support and provide additional ongoing education if she is interested after discharge.   If additional nutrition issues arise, please re-consult RD.    Colman Cater MS,RD,CSG,LDN Office: [phone] Pager: [phone]

## 2019-06-26 DIAGNOSIS — J189 Pneumonia, unspecified organism: Secondary | ICD-10-CM

## 2019-06-26 LAB — BASIC METABOLIC PANEL
Anion gap: 11 (ref 5–15)
BUN: 20 mg/dL (ref 6–20)
CO2: 37 mmol/L — ABNORMAL HIGH (ref 22–32)
Calcium: 9.1 mg/dL (ref 8.9–10.3)
Chloride: 93 mmol/L — ABNORMAL LOW (ref 98–111)
Creatinine, Ser: 0.83 mg/dL (ref 0.44–1.00)
GFR calc Af Amer: >60 mL/min (ref >60)
GFR calc non Af Amer: >60 mL/min (ref >60)
Glucose, Bld: 85 mg/dL (ref 70–99)
Potassium: 3.5 mmol/L (ref 3.5–5.1)
Sodium: 141 mmol/L (ref 135–145)

## 2019-06-26 LAB — LEGIONELLA PNEUMOPHILA SEROGP 1 UR AG: L. pneumophila Serogp 1 Ur Ag: NEGATIVE

## 2019-06-26 LAB — MAGNESIUM: Magnesium: 1.9 mg/dL (ref 1.7–2.4)

## 2019-06-26 LAB — HIV ANTIBODY (ROUTINE TESTING W REFLEX): HIV Screen 4th Generation wRfx: NONREACTIVE

## 2019-06-26 MED ORDER — CEFDINIR 300 MG PO CAPS: 300.0000 mg | ORAL_CAPSULE | Freq: Two times a day (BID) | ORAL | 0 refills | Status: DC

## 2019-06-26 MED ORDER — AZITHROMYCIN 500 MG PO TABS: 500.0000 mg | ORAL_TABLET | Freq: Every day | ORAL | 0 refills | Status: DC

## 2019-06-26 MED ORDER — SERTRALINE HCL 100 MG PO TABS: 100.0000 mg | ORAL_TABLET | Freq: Every day | ORAL | 0 refills | Status: DC

## 2019-06-26 NOTE — Progress Notes (Signed)
Patient ambulated on room air. Starting O2 sat 95%. During ambulation O2 sats dropped to 78%. Patient able to catch her breath and return to her room.

## 2019-06-26 NOTE — Discharge Summary (Signed)
Physician Discharge Summary  Ann Giles ZOX:[id number] DOB: [birth year] DOA: 06/23/2019  PCP: April Manson, NP  Admit date: 06/23/2019 Discharge date: 06/26/2019  Admitted From: Home Disposition:  Home   Recommendations for Outpatient Follow-up:  1. Follow up with PCP in 1-2 weeks 2. Please obtain BMP/CBC in one week     Discharge Condition: Stable CODE STATUS: FULL Diet recommendation: Heart Healthy    Brief/Interim Summary: 48 y.o.femalewith medical history ofdCHF, OSA/OHS,depression, hypertension, GERD, and bronchospasmpresenting with 3-day history of shortness of breath that began on 06/20/2019. Patient has had a nonproductive cough for the same period of time. She denies any hemoptysis. She denies any fevers, chills, nausea, vomiting, diarrhea, but she states that she has had some loose stools in the past 24 hoursprior to admission. She denies any recent sick contacts. She has been complaining of some increase in her lower extremity edema and orthopnea type symptoms for the past 3 daysprior to admission. She stated that she had to buy a wedge from [facility] and had to use it with her pillows to prop herself up at nighttime. The patient states that she has been watching her fluid intake as well as been compliant with her medications. She does not weigh herself. In the emergency department, patient oxygen saturation 87% room air. She was afebrile hemodynamically stable. BMP, CBC were unremarkable. Chest x-ray showed increased interstitial markings and possible patchy infiltrate in the right upper lobe. The patient was given ceftriaxone, azithromycin, Solu-Medrol, and duo nebs in the emergency department. She was stable on 3 L nasal cannula with saturation 92-94%.  Discharge Diagnoses:   Acute respiratory failure with hypoxia -presently stable on 2 L -7/4 CT chest--patchy consolidation RUL with small air bronchograms; GGO and patchy nodular foci of  consolidation bilateral lungs -Multifactorial including CHF, bronchospasm and underlying OSA/OHS -suspect GGOmore related to edema/possible ILD rather than infxas PCT <0.10 and pt without leukocytosis nor fever -suspect component of edema>>>infection as pt has had significant improvement in only 24 hours with furosemide -pt desaturated with ambulation-->d/c home with 2L [place]  Pulmonary infiltrates -doubt pneumonia as pt is afebrile without leukocytosis -check PCT<0.10 -CT chestas discussed above -SARS-CoV2--neg -will need pulmonary eval after discharge -3 more days of cefdinir and azithromycin after d/c to complete 1 week  Acute on chronic diastolic CHF -ContinueIV furosemide twice daily>>d/c home with lasix 40 po daily -pt eats fast food everyday -dietary counseling provided -Daily weights 407.1>>>387.3 (discharge weight) -06/24/19-EchoEF 60-65%, dilated IVC, trivial TR -12/24/2017 echo EF 65-70%,indeterminant diastolic dysfunction, no WMA  Bronchospasm -10/16/2017PFTs--showed diffusion defect and interstitial process without airflow obstruction. There was no bronchodilator response -continue BDs -improved with diuresis  OSA -compliant with CPAP at home -CPAP during hospitalization  Essential hypertension -Continue amlodipineand lisinopril  Depression -Continue Zoloft  Lower Extremity Edema and pain -venous duplex--neg for DVT  Morbid Obesity -BMI 59.15  Atypical Chest Pain -hs-troponin 7>>>5 -personally reviewed EKG--sinus, nonspecific T wave changes   Discharge Instructions   Allergies as of 06/26/2019   No Known Allergies     Medication List    TAKE these medications   albuterol 108 (90 Base) MCG/ACT inhaler Commonly known as: VENTOLIN HFA Inhale 2 puffs into the lungs every 6 (six) hours as needed for wheezing or shortness of breath.   amLODipine 10 MG tablet Commonly known as: NORVASC Take 1 tablet (10 mg total) by mouth daily.     azithromycin 500 MG tablet Commonly known as: ZITHROMAX Take 1 tablet (500 mg total) by mouth  daily. Start taking on: June 27, 2019   cefdinir 300 MG capsule Commonly known as: OMNICEF Take 1 capsule (300 mg total) by mouth 2 (two) times daily.   furosemide 40 MG tablet Commonly known as: LASIX Take 1 tablet (40 mg total) by mouth daily.   lisinopril 2.5 MG tablet Commonly known as: ZESTRIL Take 1 tablet (2.5 mg total) by mouth daily. What changed: how much to take   omeprazole 20 MG capsule Commonly known as: PRILOSEC Take 20 mg by mouth daily as needed (for acid reflux).   ondansetron 4 MG disintegrating tablet Commonly known as: Zofran ODT Take 1 tablet (4 mg total) by mouth every 8 (eight) hours as needed.   sertraline 100 MG tablet Commonly known as: ZOLOFT Take 1 tablet (100 mg total) by mouth daily.            Durable Medical Equipment  (From admission, onward)         Start     Ordered   06/26/19 1449  For home use only DME oxygen  Once    Question Answer Comment  Length of Need 6 Months   Mode or (Route) Nasal cannula   Liters per Minute 2   Oxygen delivery system Gas      06/26/19 1448          No Known Allergies  Consultations:  none   Procedures/Studies: Ct Chest Wo Contrast  Result Date: 06/23/2019 CLINICAL DATA:  Chest pain and worsening dyspnea and cough. EXAM: CT CHEST WITHOUT CONTRAST TECHNIQUE: Multidetector CT imaging of the chest was performed following the standard protocol without IV contrast. COMPARISON:  Chest radiograph from earlier today. FINDINGS: Cardiovascular: Top-normal heart size. No significant pericardial effusion/thickening. Normal course and caliber of the thoracic aorta. Dilated main pulmonary artery (3.9 cm diameter). Mediastinum/Nodes: No discrete thyroid nodules. Unremarkable esophagus. No pathologically enlarged axillary, mediastinal or hilar lymph nodes, noting limited sensitivity for the detection of hilar  adenopathy on this noncontrast study. Lungs/Pleura: No pneumothorax. No pleural effusion. There is patchy consolidation in the right upper lobe with a few small air bronchograms. Minimal patchy nodular foci of consolidation in remaining lung lobes bilaterally. There is patchy ground-glass opacity throughout both lungs involving all lung lobes. No lung masses or significant pulmonary nodules. No central airway stenosis. Upper abdomen: Partially visualized postsurgical changes in the proximal stomach. Mild diffuse hepatic steatosis. Cholecystectomy. Musculoskeletal: No aggressive appearing focal osseous lesions. Moderate thoracic spondylosis. IMPRESSION: 1. Patchy consolidation and ground-glass opacity throughout both lungs, most prominent in the right upper lobe, compatible with multilobar pneumonia. There are a spectrum of findings in the lungs which can be seen with acute atypical infection (as well as other non-infectious etiologies). In particular, viral pneumonia (including COVID-19) should be considered in the appropriate clinical setting. 2. Dilated main pulmonary artery, suggesting pulmonary arterial hypertension. 3. Mild diffuse hepatic steatosis. Electronically Signed   By: Ilona Sorrel M.D.   On: 06/23/2019 16:32   US Venous Img Lower Bilateral  Result Date: 06/24/2019 CLINICAL DATA:  48 year old female with bilateral chronic lower extremity pain EXAM: BILATERAL LOWER EXTREMITY VENOUS DOPPLER ULTRASOUND TECHNIQUE: Gray-scale sonography with graded compression, as well as color Doppler and duplex ultrasound were performed to evaluate the lower extremity deep venous systems from the level of the common femoral vein and including the common femoral, femoral, profunda femoral, popliteal and calf veins including the posterior tibial, peroneal and gastrocnemius veins when visible. The superficial great saphenous vein was also interrogated. Spectral Doppler  was utilized to evaluate flow at rest and with distal  augmentation maneuvers in the common femoral, femoral and popliteal veins. COMPARISON:  None. FINDINGS: RIGHT LOWER EXTREMITY Common Femoral Vein: No evidence of thrombus. Normal compressibility, respiratory phasicity and response to augmentation. Saphenofemoral Junction: No evidence of thrombus. Normal compressibility and flow on color Doppler imaging. Profunda Femoral Vein: No evidence of thrombus. Normal compressibility and flow on color Doppler imaging. Femoral Vein: No evidence of thrombus. Normal compressibility, respiratory phasicity and response to augmentation. Popliteal Vein: No evidence of thrombus. Normal compressibility, respiratory phasicity and response to augmentation. Calf Veins: No evidence of thrombus. Normal compressibility and flow on color Doppler imaging. Superficial Great Saphenous Vein: No evidence of thrombus. Normal compressibility. Venous Reflux:  None. Other Findings:  None. LEFT LOWER EXTREMITY Common Femoral Vein: No evidence of thrombus. Normal compressibility, respiratory phasicity and response to augmentation. Saphenofemoral Junction: No evidence of thrombus. Normal compressibility and flow on color Doppler imaging. Profunda Femoral Vein: No evidence of thrombus. Normal compressibility and flow on color Doppler imaging. Femoral Vein: No evidence of thrombus. Normal compressibility, respiratory phasicity and response to augmentation. Popliteal Vein: No evidence of thrombus. Normal compressibility, respiratory phasicity and response to augmentation. Calf Veins: No evidence of thrombus. Normal compressibility and flow on color Doppler imaging. Superficial Great Saphenous Vein: No evidence of thrombus. Normal compressibility. Venous Reflux:  None. Other Findings:  None. IMPRESSION: No evidence of deep venous thrombosis in either lower extremity. Electronically Signed   By: Malachy MoanHeath  McCullough M.D.   On: 06/24/2019 10:38   Dg Chest Portable 1 View  Result Date: 06/23/2019 CLINICAL DATA:   Shortness of breath.  Asthma.  Obesity. EXAM: PORTABLE CHEST 1 VIEW COMPARISON:  12/23/2017 FINDINGS: Heart size upper limits of normal allowing for technical factors. Question hazy patchy infiltrates in both lungs, particularly the right upper lobe. No dense consolidation, collapse or effusion. No acute bone finding. IMPRESSION: Suspicion of patchy bilateral infiltrates consistent with pneumonia, most notable in the right upper lobe. Electronically Signed   By: Paulina FusiMark  Shogry M.D.   On: 06/23/2019 10:14         Discharge Exam: Vitals:   06/26/19 0726 06/26/19 1447  BP:    Pulse:    Resp:    Temp:    SpO2: 94% 95%   Vitals:   06/26/19 0531 06/26/19 0719 06/26/19 0726 06/26/19 1447  BP: (!) 154/90     Pulse: (!) 56     Resp: 18     Temp: 97.6 F (36.4 C)     TempSrc: Oral     SpO2: 97% 94% 94% 95%  Weight:      Height:        General: Pt is alert, awake, not in acute distress Cardiovascular: RRR, S1/S2 +, no rubs, no gallops Respiratory: CTA bilaterally, no wheezing, no rhonchi Abdominal: Soft, NT, ND, bowel sounds + Extremities: no edema, no cyanosis   The results of significant diagnostics from this hospitalization (including imaging, microbiology, ancillary and laboratory) are listed below for reference.    Significant Diagnostic Studies: Ct Chest Wo Contrast  Result Date: 06/23/2019 CLINICAL DATA:  Chest pain and worsening dyspnea and cough. EXAM: CT CHEST WITHOUT CONTRAST TECHNIQUE: Multidetector CT imaging of the chest was performed following the standard protocol without IV contrast. COMPARISON:  Chest radiograph from earlier today. FINDINGS: Cardiovascular: Top-normal heart size. No significant pericardial effusion/thickening. Normal course and caliber of the thoracic aorta. Dilated main pulmonary artery (3.9 cm diameter). Mediastinum/Nodes: No discrete thyroid  nodules. Unremarkable esophagus. No pathologically enlarged axillary, mediastinal or hilar lymph nodes, noting  limited sensitivity for the detection of hilar adenopathy on this noncontrast study. Lungs/Pleura: No pneumothorax. No pleural effusion. There is patchy consolidation in the right upper lobe with a few small air bronchograms. Minimal patchy nodular foci of consolidation in remaining lung lobes bilaterally. There is patchy ground-glass opacity throughout both lungs involving all lung lobes. No lung masses or significant pulmonary nodules. No central airway stenosis. Upper abdomen: Partially visualized postsurgical changes in the proximal stomach. Mild diffuse hepatic steatosis. Cholecystectomy. Musculoskeletal: No aggressive appearing focal osseous lesions. Moderate thoracic spondylosis. IMPRESSION: 1. Patchy consolidation and ground-glass opacity throughout both lungs, most prominent in the right upper lobe, compatible with multilobar pneumonia. There are a spectrum of findings in the lungs which can be seen with acute atypical infection (as well as other non-infectious etiologies). In particular, viral pneumonia (including COVID-19) should be considered in the appropriate clinical setting. 2. Dilated main pulmonary artery, suggesting pulmonary arterial hypertension. 3. Mild diffuse hepatic steatosis. Electronically Signed   By: Delbert PhenixJason A Poff M.D.   On: 06/23/2019 16:32   [place] Venous Img Lower Bilateral  Result Date: 06/24/2019 CLINICAL DATA:  48 year old female with bilateral chronic lower extremity pain EXAM: BILATERAL LOWER EXTREMITY VENOUS DOPPLER ULTRASOUND TECHNIQUE: Gray-scale sonography with graded compression, as well as color Doppler and duplex ultrasound were performed to evaluate the lower extremity deep venous systems from the level of the common femoral vein and including the common femoral, femoral, profunda femoral, popliteal and calf veins including the posterior tibial, peroneal and gastrocnemius veins when visible. The superficial great saphenous vein was also interrogated. Spectral Doppler was  utilized to evaluate flow at rest and with distal augmentation maneuvers in the common femoral, femoral and popliteal veins. COMPARISON:  None. FINDINGS: RIGHT LOWER EXTREMITY Common Femoral Vein: No evidence of thrombus. Normal compressibility, respiratory phasicity and response to augmentation. Saphenofemoral Junction: No evidence of thrombus. Normal compressibility and flow on color Doppler imaging. Profunda Femoral Vein: No evidence of thrombus. Normal compressibility and flow on color Doppler imaging. Femoral Vein: No evidence of thrombus. Normal compressibility, respiratory phasicity and response to augmentation. Popliteal Vein: No evidence of thrombus. Normal compressibility, respiratory phasicity and response to augmentation. Calf Veins: No evidence of thrombus. Normal compressibility and flow on color Doppler imaging. Superficial Great Saphenous Vein: No evidence of thrombus. Normal compressibility. Venous Reflux:  None. Other Findings:  None. LEFT LOWER EXTREMITY Common Femoral Vein: No evidence of thrombus. Normal compressibility, respiratory phasicity and response to augmentation. Saphenofemoral Junction: No evidence of thrombus. Normal compressibility and flow on color Doppler imaging. Profunda Femoral Vein: No evidence of thrombus. Normal compressibility and flow on color Doppler imaging. Femoral Vein: No evidence of thrombus. Normal compressibility, respiratory phasicity and response to augmentation. Popliteal Vein: No evidence of thrombus. Normal compressibility, respiratory phasicity and response to augmentation. Calf Veins: No evidence of thrombus. Normal compressibility and flow on color Doppler imaging. Superficial Great Saphenous Vein: No evidence of thrombus. Normal compressibility. Venous Reflux:  None. Other Findings:  None. IMPRESSION: No evidence of deep venous thrombosis in either lower extremity. Electronically Signed   By: Malachy MoanHeath  McCullough M.D.   On: 06/24/2019 10:38   Dg Chest  Portable 1 View  Result Date: 06/23/2019 CLINICAL DATA:  Shortness of breath.  Asthma.  Obesity. EXAM: PORTABLE CHEST 1 VIEW COMPARISON:  12/23/2017 FINDINGS: Heart size upper limits of normal allowing for technical factors. Question hazy patchy infiltrates in both lungs, particularly the right  upper lobe. No dense consolidation, collapse or effusion. No acute bone finding. IMPRESSION: Suspicion of patchy bilateral infiltrates consistent with pneumonia, most notable in the right upper lobe. Electronically Signed   By: Paulina FusiMark  Shogry M.D.   On: 06/23/2019 10:14     Microbiology: Recent Results (from the past 240 hour(s))  SARS Coronavirus 2 (CEPHEID- Performed [facility]), Hosp Order     Status: None   Collection Time: 06/23/19  9:48 AM   Specimen: Nasopharyngeal Swab  Result Value Ref Range Status   SARS Coronavirus 2 NEGATIVE NEGATIVE Final    Comment: (NOTE) If result is NEGATIVE SARS-CoV-2 target nucleic acids are NOT DETECTED. The SARS-CoV-2 RNA is generally detectable in upper and lower  respiratory specimens during the acute phase of infection. The lowest  concentration of SARS-CoV-2 viral copies this assay can detect is 250  copies / mL. A negative result does not preclude SARS-CoV-2 infection  and should not be used as the sole basis for treatment or other  patient management decisions.  A negative result may occur with  improper specimen collection / handling, submission of specimen other  than nasopharyngeal swab, presence of viral mutation(s) within the  areas targeted by this assay, and inadequate number of viral copies  (<250 copies / mL). A negative result must be combined with clinical  observations, patient history, and epidemiological information. If result is POSITIVE SARS-CoV-2 target nucleic acids are DETECTED. The SARS-CoV-2 RNA is generally detectable in upper and lower  respiratory specimens dur ing the acute phase of infection.  Positive  results  are indicative of active infection with SARS-CoV-2.  Clinical  correlation with patient history and other diagnostic information is  necessary to determine patient infection status.  Positive results do  not rule out bacterial infection or co-infection with other viruses. If result is PRESUMPTIVE POSTIVE SARS-CoV-2 nucleic acids MAY BE PRESENT.   A presumptive positive result was obtained on the submitted specimen  and confirmed on repeat testing.  While 2019 novel coronavirus  (SARS-CoV-2) nucleic acids may be present in the submitted sample  additional confirmatory testing may be necessary for epidemiological  and / or clinical management purposes  to differentiate between  SARS-CoV-2 and other Sarbecovirus currently known to infect humans.  If clinically indicated additional testing with an alternate test  methodology [phone]([id number]) is advised. The SARS-CoV-2 RNA is generally  detectable in upper and lower respiratory sp ecimens during the acute  phase of infection. The expected result is Negative. Fact Sheet for Patients:  BoilerBrush.com.cyhttps://www.fda.gov/media/136312/download Fact Sheet for Healthcare Providers: https://pope.com/https://www.fda.gov/media/136313/download This test is not yet approved or cleared by the [place] FDA and has been authorized for detection and/or diagnosis of SARS-CoV-2 by FDA under an Emergency Use Authorization (EUA).  This EUA will remain in effect (meaning this test can be used) for the duration of the COVID-19 declaration under Section 564(b)(1) of the Act, 21 U.S.C. section 360bbb-3(b)(1), unless the authorization is terminated or revoked sooner. Performed at [facility], [address] [place], [place] [address]   Respiratory Panel by PCR     Status: None   Collection Time: 06/25/19 10:15 AM   Specimen: Nasopharyngeal Swab; Respiratory  Result Value Ref Range Status   Adenovirus NOT DETECTED NOT DETECTED Final   Coronavirus 229E NOT DETECTED NOT DETECTED Final     Comment: (NOTE) The Coronavirus on the Respiratory Panel, DOES NOT test for the novel  Coronavirus (2019 nCoV)    Coronavirus HKU1 NOT DETECTED NOT  DETECTED Final   Coronavirus NL63 NOT DETECTED NOT DETECTED Final   Coronavirus OC43 NOT DETECTED NOT DETECTED Final   Metapneumovirus NOT DETECTED NOT DETECTED Final   Rhinovirus / Enterovirus NOT DETECTED NOT DETECTED Final   Influenza A NOT DETECTED NOT DETECTED Final   Influenza B NOT DETECTED NOT DETECTED Final   Parainfluenza Virus 1 NOT DETECTED NOT DETECTED Final   Parainfluenza Virus 2 NOT DETECTED NOT DETECTED Final   Parainfluenza Virus 3 NOT DETECTED NOT DETECTED Final   Parainfluenza Virus 4 NOT DETECTED NOT DETECTED Final   Respiratory Syncytial Virus NOT DETECTED NOT DETECTED Final   Bordetella pertussis NOT DETECTED NOT DETECTED Final   Chlamydophila pneumoniae NOT DETECTED NOT DETECTED Final   Mycoplasma pneumoniae NOT DETECTED NOT DETECTED Final    Comment: Performed at [facility], [address], [place], [place] [address]     Labs: Basic Metabolic Panel: Recent Labs  Lab 06/23/19 [phone] 06/24/19 0705 06/25/19 0609 06/26/19 0615  NA 145 143 141 141  K 4.1 4.3 3.6 3.5  CL 100 95* 92* 93*  CO2 35* 37* 38* 37*  GLUCOSE 96 122* 87 85  BUN 11 16 21* 20  CREATININE 0.97 0.86 0.97 0.83  CALCIUM 8.8* 8.9 9.1 9.1  MG  --   --  1.7 1.9   Liver Function Tests: No results for input(s): AST, ALT, ALKPHOS, BILITOT, PROT, ALBUMIN in the last 168 hours. No results for input(s): LIPASE, AMYLASE in the last 168 hours. No results for input(s): AMMONIA in the last 168 hours. CBC: Recent Labs  Lab 06/23/19 0946 06/25/19 0609  WBC 7.1 7.9  NEUTROABS 5.1  --   HGB 11.7* 11.8*  HCT 41.3 41.2  MCV 102.7* 100.2*  PLT 233 252   Cardiac Enzymes: No results for input(s): CKTOTAL, CKMB, CKMBINDEX, TROPONINI in the last 168 hours. BNP: Invalid input(s): POCBNP CBG: No results for input(s): GLUCAP in the last 168  hours.  Time coordinating discharge:  36 minutes  Signed:  Catarina Hartshorn, DO [facility] Pager: [phone] 06/26/2019, 5:59 PM

## 2019-06-26 NOTE — TOC Transition Note (Signed)
Transition of Care [facility]) - CM/SW Discharge Note   Patient Details  Name: Ann Giles MRN: [id number] Date of Birth: 1971-06-28  [facility] CM/SW Contact:  Aariyana Manz, Chauncey Reading, RN Phone Number: 06/26/2019, 10:23 AM   Clinical Narrative:  [employer] to deliver tank today for transport home.       Barriers to Discharge: No Barriers Identified   Patient Goals and CMS Choice Patient states their goals for this hospitalization and ongoing recovery are:: breath easier and get back home             Social Determinants of Health (SDOH) Interventions     Readmission Risk Interventions Readmission Risk Prevention Plan 06/25/2019  Medication Screening Complete  Transportation Screening Complete  Some recent data might be hidden

## 2020-06-13 ENCOUNTER — Telehealth: Payer: Self-pay

## 2020-06-13 NOTE — Telephone Encounter (Signed)
REFERRAL ONLY FROM [facility] WHITE NP [phone] SENT REFERRAL TO SCHEDULING

## 2020-06-24 ENCOUNTER — Encounter: Payer: Self-pay | Admitting: Internal Medicine

## 2020-07-25 ENCOUNTER — Ambulatory Visit: Admitting: Cardiology

## 2020-07-30 ENCOUNTER — Ambulatory Visit: Admitting: Internal Medicine

## 2020-07-30 ENCOUNTER — Encounter: Payer: Self-pay | Admitting: Internal Medicine

## 2020-08-08 ENCOUNTER — Ambulatory Visit (INDEPENDENT_AMBULATORY_CARE_PROVIDER_SITE_OTHER): Payer: Self-pay | Admitting: Cardiology

## 2020-08-08 ENCOUNTER — Other Ambulatory Visit: Payer: Self-pay

## 2020-08-08 ENCOUNTER — Encounter: Payer: Self-pay | Admitting: Cardiology

## 2020-08-08 VITALS — BP 176/99 | HR 75 | Ht 68.0 in | Wt 384.0 lb

## 2020-08-08 DIAGNOSIS — I10 Essential (primary) hypertension: Secondary | ICD-10-CM

## 2020-08-08 MED ORDER — FUROSEMIDE 20 MG PO TABS: 20.0000 mg | ORAL_TABLET | Freq: Every day | ORAL | 3 refills | Status: DC

## 2020-08-08 NOTE — Patient Instructions (Signed)
Medication Instructions:  Your physician has recommended you make the following change in your medication:   Take Lasix 20 mg Daily may take an additional 20 mg for swelling.   *If you need a refill on your cardiac medications before your next appointment, please call your pharmacy*   Lab Work: NONE   If you have labs (blood work) drawn today and your tests are completely normal, you will receive your results only by: Marland Kitchen MyChart Message (if you have MyChart) OR . A paper copy in the mail If you have any lab test that is abnormal or we need to change your treatment, we will call you to review the results.   Testing/Procedures: NONE    Follow-Up: At  [facility], you and your health needs are our priority.  As part of our continuing mission to provide you with exceptional heart care, we have created designated Provider Care Teams.  These Care Teams include your primary Cardiologist (physician) and Advanced Practice Providers (APPs -  Physician Assistants and Nurse Practitioners) who all work together to provide you with the care you need, when you need it.  We recommend signing up for the patient portal called "MyChart".  Sign up information is provided on this After Visit Summary.  MyChart is used to connect with patients for Virtual Visits (Telemedicine).  Patients are able to view lab/test results, encounter notes, upcoming appointments, etc.  Non-urgent messages can be sent to your provider as well.   To learn more about what you can do with MyChart, go to [email or web address].    Your next appointment:   6 week(s)  The format for your next appointment:   In Person  Provider:   You may see No primary care provider on file. or one of the following Advanced Practice Providers on your designated Care Team:    Randall An, PA-C   Jacolyn Reedy, [place]     Other Instructions Thank you for choosing [facility]!

## 2020-08-08 NOTE — Progress Notes (Signed)
Clinical Summary Ann Giles is a 49 y.o.female seen today as a new consult, reeferred by NP white for the following medical problems.    1. Chronic diastolic HF -06/2019 echo LVEF 16-07%, cannot eval diastolic function, normal RV - taking lasix lasix intermittently, often goes too much when she takes it. Takes the lasix about 3 days a week   2. HTN - compliant with meds, however has not taken yet today.    3. OSA - followed by pulmonary, use bipap at night.     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 Works 9PM to [employer] Past Medical History:  Diagnosis Date  . Arthritis   . Asthma   . Bronchitis   . CHF (congestive heart failure) (HCC)   . Depression   . Hypertension   . OSA (obstructive sleep apnea)      No Known Allergies   Current Outpatient Medications  Medication Sig Dispense Refill  . albuterol (PROVENTIL HFA;VENTOLIN HFA) 108 (90 Base) MCG/ACT inhaler Inhale 2 puffs into the lungs every 6 (six) hours as needed for wheezing or shortness of breath.    Marland Kitchen amLODipine (NORVASC) 10 MG tablet Take 1 tablet (10 mg total) by mouth daily. 30 tablet 1  . azithromycin (ZITHROMAX) 500 MG tablet Take 1 tablet (500 mg total) by mouth daily. 3 tablet 0  . cefdinir (OMNICEF) 300 MG capsule Take 1 capsule (300 mg total) by mouth 2 (two) times daily. 6 capsule 0  . furosemide (LASIX) 40 MG tablet Take 1 tablet (40 mg total) by mouth daily. 30 tablet 1  . lisinopril (PRINIVIL,ZESTRIL) 2.5 MG tablet Take 1 tablet (2.5 mg total) by mouth daily. (Patient taking differently: Take 10 mg by mouth daily. ) 30 tablet 1  . omeprazole (PRILOSEC) 20 MG capsule Take 20 mg by mouth daily as needed (for acid reflux).     . ondansetron (ZOFRAN ODT) 4 MG disintegrating tablet Take 1 tablet (4 mg total) by mouth every 8 (eight) hours as needed. 10 tablet 0  . sertraline (ZOLOFT) 100 MG tablet Take 1 tablet (100 mg total) by mouth daily. 30 tablet 0   No current facility-administered  medications for this visit.     Past Surgical History:  Procedure Laterality Date  . ABDOMINAL HYSTERECTOMY    . CHOLECYSTECTOMY    . GASTROPLASTY       No Known Allergies    Family History  Problem Relation Age of Onset  . Asthma Neg Hx      Social History Ann Giles reports that she has never smoked. She has never used smokeless tobacco. Ann Giles reports no history of alcohol use.   Review of Systems CONSTITUTIONAL: No weight loss, fever, chills, weakness or fatigue.  HEENT: Eyes: No visual loss, blurred vision, double vision or yellow sclerae.No hearing loss, sneezing, congestion, runny nose or sore throat.  SKIN: No rash or itching.  CARDIOVASCULAR: per hpi RESPIRATORY: No shortness of breath, cough or sputum.  GASTROINTESTINAL: No anorexia, nausea, vomiting or diarrhea. No abdominal pain or blood.  GENITOURINARY: No burning on urination, no polyuria NEUROLOGICAL: No headache, dizziness, syncope, paralysis, ataxia, numbness or tingling in the extremities. No change in bowel or bladder control.  MUSCULOSKELETAL: No muscle, back pain, joint pain or stiffness.  LYMPHATICS: No enlarged nodes. No history of splenectomy.  PSYCHIATRIC: No history of depression or anxiety.  ENDOCRINOLOGIC: No reports of sweating, cold or heat intolerance. No polyuria or polydipsia.  Marland Kitchen   Physical  Examination Today's Vitals   08/08/20 0851  BP: (!) 176/99  Pulse: 75  SpO2: 97%  Weight: (!) 384 lb (174.2 kg)  Height: 5\' 8"  (1.727 m)   Body mass index is 58.39 kg/m.  Gen: resting comfortably, no acute distress HEENT: no scleral icterus, pupils equal round and reactive, no palptable cervical adenopathy,  CV: RRR, no m/r/g, no jvd Resp: Clear to auscultation bilaterally GI: abdomen is soft, non-tender, non-distended, normal bowel sounds, no hepatosplenomegaly MSK: extremities are warm, trace bilateral edema.  Skin: warm, no rash Neuro:  no focal deficits Psych:  appropriate affect   Diagnostic Studies  06/2019 echo IMPRESSIONS    1. The left ventricle has normal systolic function with an ejection  fraction of 60-65%. The cavity size was normal. There is mildly increased  left ventricular wall thickness. Left ventricular diastolic function could  not be evaluated.  2. The right ventricle has normal systolic function. The cavity was  normal.  3. The inferior vena cava was dilated in size with <50% respiratory  variability.  4. The aortic valve has an indeterminate number of cusps. No stenosis of  the aortic valve.  5. The mitral valve is grossly normal.  6. Normal LV systolic function; mild LVH.   Assessment and Plan  1. Chronic diastolic HF - some ongoing LE edema. Difficultly taking lasix due to frequent urination at work, if takes after work issues trying to sleep - try lower dose 20mg  daily of lasix, discussed on off days could take 40mg  daily  2. HTN - above goal, has not taken meds yet today - at pcp visit bp was 140s/80s - follow bp with diuresis, may need adjustement in bp regimen at f/u  F/u 6 weeks, see how tolerating diuretic regimen and assess bp and volume status.      07/2019, M.D.

## 2020-09-25 ENCOUNTER — Ambulatory Visit: Payer: Self-pay | Admitting: Student

## 2020-10-03 ENCOUNTER — Ambulatory Visit: Payer: Self-pay | Admitting: Student

## 2020-10-03 NOTE — Progress Notes (Deleted)
Cardiology Office Note    Date:  10/03/2020   ID:  Ann Giles, DOB 06-25-1971, MRN [id number]  PCP:  April Manson, NP  Cardiologist: No primary care provider on file.    No chief complaint on file.   History of Present Illness:    Ann Giles is a 49 y.o. female with past medical history of chronic diastolic CHF, HTN and OSA who presents to the office today for 19-month follow-up.  She was last exam by Dr. Wyline Mood in 07/2020 is any patient referral for worsening lower extremity edema.  She was taking Lasix intermittently, approximately 3 times per week as she experienced to frequent urination when taking this daily.  Given her edema, was recommended that she try taking Lasix 20 mg daily and then on her days off from work to take 40 mg daily.  BP was elevated but she had not yet taken her morning medications, therefore was recommended to readdress this at follow-up.    Past Medical History:  Diagnosis Date  . Arthritis   . Asthma   . Bronchitis   . CHF (congestive heart failure) (HCC)   . Depression   . Hypertension   . OSA (obstructive sleep apnea)     Past Surgical History:  Procedure Laterality Date  . ABDOMINAL HYSTERECTOMY    . CHOLECYSTECTOMY    . GASTROPLASTY      Current Medications: Outpatient Medications Prior to Visit  Medication Sig Dispense Refill  . albuterol (PROVENTIL HFA;VENTOLIN HFA) 108 (90 Base) MCG/ACT inhaler Inhale 2 puffs into the lungs every 6 (six) hours as needed for wheezing or shortness of breath.    Marland Kitchen amLODipine (NORVASC) 10 MG tablet Take 1 tablet (10 mg total) by mouth daily. 30 tablet 1  . furosemide (LASIX) 20 MG tablet Take 1 tablet (20 mg total) by mouth daily. May take additional 20 mg for swelling Daily 180 tablet 3  . lisinopril (PRINIVIL,ZESTRIL) 2.5 MG tablet Take 1 tablet (2.5 mg total) by mouth daily. 30 tablet 1  . omeprazole (PRILOSEC) 20 MG capsule Take 20 mg by mouth daily as needed (for acid reflux).       . sertraline (ZOLOFT) 100 MG tablet Take 1 tablet (100 mg total) by mouth daily. 30 tablet 0   No facility-administered medications prior to visit.     Allergies:   Patient has no known allergies.   Social History   Socioeconomic History  . Marital status: Widowed    Spouse name: Not on file  . Number of children: Not on file  . Years of education: Not on file  . Highest education level: Not on file  Occupational History  . Not on file  Tobacco Use  . Smoking status: Never Smoker  . Smokeless tobacco: Never Used  Vaping Use  . Vaping Use: Never assessed  Substance and Sexual Activity  . Alcohol use: No  . Drug use: No  . Sexual activity: Not on file  Other Topics Concern  . Not on file  Social History Narrative  . Not on file   Social Determinants of Health   Financial Resource Strain:   . Difficulty of Paying Living Expenses: Not on file  Food Insecurity:   . Worried About Programme researcher, broadcasting/film/video in the Last Year: Not on file  . Ran Out of Food in the Last Year: Not on file  Transportation Needs:   . Lack of Transportation (Medical): Not on file  . Lack of  Transportation (Non-Medical): Not on file  Physical Activity:   . Days of Exercise per Week: Not on file  . Minutes of Exercise per Session: Not on file  Stress:   . Feeling of Stress : Not on file  Social Connections:   . Frequency of Communication with Friends and Family: Not on file  . Frequency of Social Gatherings with Friends and Family: Not on file  . Attends Religious Services: Not on file  . Active Member of Clubs or Organizations: Not on file  . Attends Banker Meetings: Not on file  . Marital Status: Not on file     Family History:  The patient's ***family history is not on file.   Review of Systems:   Please see the history of present illness.     General:  No chills, fever, night sweats or weight changes.  Cardiovascular:  No chest pain, dyspnea on exertion, edema, orthopnea,  palpitations, paroxysmal nocturnal dyspnea. Dermatological: No rash, lesions/masses Respiratory: No cough, dyspnea Urologic: No hematuria, dysuria Abdominal:   No nausea, vomiting, diarrhea, bright red blood per rectum, melena, or hematemesis Neurologic:  No visual changes, wkns, changes in mental status. All other systems reviewed and are otherwise negative except as noted above.   Physical Exam:    VS:  There were no vitals taken for this visit.   General: Well developed, well nourished,female appearing in no acute distress. Head: Normocephalic, atraumatic. Neck: No carotid bruits. JVD not elevated.  Lungs: Respirations regular and unlabored, without wheezes or rales.  Heart: ***Regular rate and rhythm. No S3 or S4.  No murmur, no rubs, or gallops appreciated. Abdomen: Appears non-distended. No obvious abdominal masses. Msk:  Strength and tone appear normal for age. No obvious joint deformities or effusions. Extremities: No clubbing or cyanosis. No edema.  Distal pedal pulses are 2+ bilaterally. Neuro: Alert and oriented X 3. Moves all extremities spontaneously. No focal deficits noted. Psych:  Responds to questions appropriately with a normal affect. Skin: No rashes or lesions noted  Wt Readings from Last 3 Encounters:  08/08/20 (!) 384 lb (174.2 kg)  06/25/19 (!) 396 lb 4.8 oz (179.8 kg)  03/12/18 (!) 387 lb (175.5 kg)        Studies/Labs Reviewed:   EKG:  EKG is*** ordered today.  The ekg ordered today demonstrates ***  Recent Labs: No results found for requested labs within last 8760 hours.   Lipid Panel    Component Value Date/Time   CHOL 151 06/24/2019 0705   TRIG 43 06/24/2019 0705   HDL 64 06/24/2019 0705   CHOLHDL 2.4 06/24/2019 0705   VLDL 9 06/24/2019 0705   LDLCALC 78 06/24/2019 0705    Additional studies/ records that were reviewed today include:   Echocardiogram: 06/2019 IMPRESSIONS    1. The left ventricle has normal systolic function with an  ejection  fraction of 60-65%. The cavity size was normal. There is mildly increased  left ventricular wall thickness. Left ventricular diastolic function could  not be evaluated.  2. The right ventricle has normal systolic function. The cavity was  normal.  3. The inferior vena cava was dilated in size with <50% respiratory  variability.  4. The aortic valve has an indeterminate number of cusps. No stenosis of  the aortic valve.  5. The mitral valve is grossly normal.  6. Normal LV systolic function; mild LVH.   Assessment:    No diagnosis found.   Plan:   In order of problems listed  above:  1. ***    Medication Adjustments/Labs and Tests Ordered: Current medicines are reviewed at length with the patient today.  Concerns regarding medicines are outlined above.  Medication changes, Labs and Tests ordered today are listed in the Patient Instructions below. There are no Patient Instructions on file for this visit.   Signed, Ellsworth Lennox, PA-C  10/03/2020 1:01 PM    [facility] [address] [place], [place] [address] Phone: [phone] Fax: [phone]

## 2020-10-31 ENCOUNTER — Emergency Department (HOSPITAL_COMMUNITY): Payer: Self-pay

## 2020-10-31 ENCOUNTER — Other Ambulatory Visit: Payer: Self-pay

## 2020-10-31 ENCOUNTER — Inpatient Hospital Stay (HOSPITAL_COMMUNITY)
Admission: EM | Admit: 2020-10-31 | Discharge: 2020-11-04 | DRG: 291 | Disposition: A | Discharge status: Home / Self Care | Attending: Internal Medicine | Admitting: Internal Medicine

## 2020-10-31 ENCOUNTER — Encounter (HOSPITAL_COMMUNITY): Payer: Self-pay

## 2020-10-31 DIAGNOSIS — I10 Essential (primary) hypertension: Secondary | ICD-10-CM | POA: Diagnosis not present

## 2020-10-31 DIAGNOSIS — E662 Morbid (severe) obesity with alveolar hypoventilation: Secondary | ICD-10-CM | POA: Diagnosis not present

## 2020-10-31 DIAGNOSIS — F419 Anxiety disorder, unspecified: Secondary | ICD-10-CM | POA: Diagnosis present

## 2020-10-31 DIAGNOSIS — R0902 Hypoxemia: Secondary | ICD-10-CM

## 2020-10-31 DIAGNOSIS — Z6841 Body Mass Index (BMI) 40.0 and over, adult: Secondary | ICD-10-CM

## 2020-10-31 DIAGNOSIS — Z23 Encounter for immunization: Secondary | ICD-10-CM | POA: Diagnosis not present

## 2020-10-31 DIAGNOSIS — I5033 Acute on chronic diastolic (congestive) heart failure: Secondary | ICD-10-CM | POA: Diagnosis not present

## 2020-10-31 DIAGNOSIS — I110 Hypertensive heart disease with heart failure: Secondary | ICD-10-CM | POA: Diagnosis not present

## 2020-10-31 DIAGNOSIS — J9601 Acute respiratory failure with hypoxia: Secondary | ICD-10-CM | POA: Diagnosis not present

## 2020-10-31 DIAGNOSIS — G4733 Obstructive sleep apnea (adult) (pediatric): Secondary | ICD-10-CM | POA: Diagnosis present

## 2020-10-31 DIAGNOSIS — Z90710 Acquired absence of both cervix and uterus: Secondary | ICD-10-CM | POA: Diagnosis not present

## 2020-10-31 DIAGNOSIS — Z20822 Contact with and (suspected) exposure to covid-19: Secondary | ICD-10-CM | POA: Diagnosis not present

## 2020-10-31 DIAGNOSIS — M1990 Unspecified osteoarthritis, unspecified site: Secondary | ICD-10-CM | POA: Diagnosis present

## 2020-10-31 DIAGNOSIS — E66813 Obesity, class 3: Secondary | ICD-10-CM | POA: Diagnosis present

## 2020-10-31 DIAGNOSIS — I509 Heart failure, unspecified: Secondary | ICD-10-CM

## 2020-10-31 DIAGNOSIS — F32A Depression, unspecified: Secondary | ICD-10-CM | POA: Diagnosis present

## 2020-10-31 DIAGNOSIS — K219 Gastro-esophageal reflux disease without esophagitis: Secondary | ICD-10-CM

## 2020-10-31 DIAGNOSIS — R0602 Shortness of breath: Secondary | ICD-10-CM | POA: Diagnosis present

## 2020-10-31 DIAGNOSIS — Z79899 Other long term (current) drug therapy: Secondary | ICD-10-CM

## 2020-10-31 DIAGNOSIS — J4521 Mild intermittent asthma with (acute) exacerbation: Secondary | ICD-10-CM | POA: Diagnosis not present

## 2020-10-31 DIAGNOSIS — J45909 Unspecified asthma, uncomplicated: Secondary | ICD-10-CM | POA: Diagnosis present

## 2020-10-31 DIAGNOSIS — J45901 Unspecified asthma with (acute) exacerbation: Secondary | ICD-10-CM | POA: Diagnosis present

## 2020-10-31 LAB — CBC WITH DIFFERENTIAL/PLATELET
Abs Immature Granulocytes: 0.1 10*3/uL — ABNORMAL HIGH (ref 0.00–0.07)
Basophils Absolute: 0 10*3/uL (ref 0.0–0.1)
Basophils Relative: 1 %
Eosinophils Absolute: 0 10*3/uL (ref 0.0–0.5)
Eosinophils Relative: 1 %
HCT: 45.7 % (ref 36.0–46.0)
Hemoglobin: 13.1 g/dL (ref 12.0–15.0)
Immature Granulocytes: 2 %
Lymphocytes Relative: 20 %
Lymphs Abs: 1.2 10*3/uL (ref 0.7–4.0)
MCH: 29.3 pg (ref 26.0–34.0)
MCHC: 28.7 g/dL — ABNORMAL LOW (ref 30.0–36.0)
MCV: 102.2 fL — ABNORMAL HIGH (ref 80.0–100.0)
Monocytes Absolute: 0.5 10*3/uL (ref 0.1–1.0)
Monocytes Relative: 8 %
Neutro Abs: 4.4 10*3/uL (ref 1.7–7.7)
Neutrophils Relative %: 68 %
Platelets: 258 10*3/uL (ref 150–400)
RBC: 4.47 MIL/uL (ref 3.87–5.11)
RDW: 17.4 % — ABNORMAL HIGH (ref 11.5–15.5)
WBC: 6.3 10*3/uL (ref 4.0–10.5)
nRBC: 0.6 % — ABNORMAL HIGH (ref 0.0–0.2)

## 2020-10-31 LAB — COMPREHENSIVE METABOLIC PANEL
ALT: 14 U/L (ref 0–44)
AST: 12 U/L — ABNORMAL LOW (ref 15–41)
Albumin: 3.7 g/dL (ref 3.5–5.0)
Alkaline Phosphatase: 62 U/L (ref 38–126)
Anion gap: 11 (ref 5–15)
BUN: 14 mg/dL (ref 6–20)
CO2: 35 mmol/L — ABNORMAL HIGH (ref 22–32)
Calcium: 8.7 mg/dL — ABNORMAL LOW (ref 8.9–10.3)
Chloride: 96 mmol/L — ABNORMAL LOW (ref 98–111)
Creatinine, Ser: 0.97 mg/dL (ref 0.44–1.00)
GFR, Estimated: >60 mL/min (ref >60)
Glucose, Bld: 108 mg/dL — ABNORMAL HIGH (ref 70–99)
Potassium: 3.6 mmol/L (ref 3.5–5.1)
Sodium: 142 mmol/L (ref 135–145)
Total Bilirubin: 1.6 mg/dL — ABNORMAL HIGH (ref 0.3–1.2)
Total Protein: 8 g/dL (ref 6.5–8.1)

## 2020-10-31 LAB — TROPONIN I (HIGH SENSITIVITY)
Troponin I (High Sensitivity): 6 ng/L (ref <18)
Troponin I (High Sensitivity): 7 ng/L (ref <18)

## 2020-10-31 LAB — RESP PANEL BY RT PCR (RSV, FLU A&B, COVID)
Influenza A by PCR: NEGATIVE
Influenza B by PCR: NEGATIVE
Respiratory Syncytial Virus by PCR: NEGATIVE
SARS Coronavirus 2 by RT PCR: NEGATIVE

## 2020-10-31 LAB — TSH: TSH: 2.218 u[IU]/mL (ref 0.350–4.500)

## 2020-10-31 LAB — BRAIN NATRIURETIC PEPTIDE: B Natriuretic Peptide: 27 pg/mL (ref 0.0–100.0)

## 2020-10-31 LAB — MAGNESIUM: Magnesium: 1.8 mg/dL (ref 1.7–2.4)

## 2020-10-31 MED ORDER — IPRATROPIUM-ALBUTEROL 0.5-2.5 (3) MG/3ML IN SOLN
3.0000 mL | Freq: Once | RESPIRATORY_TRACT | Status: AC
  Administered 2020-10-31: 3 mL via RESPIRATORY_TRACT
  Filled 2020-10-31: qty 3

## 2020-10-31 MED ORDER — FUROSEMIDE 10 MG/ML IJ SOLN
40.0000 mg | Freq: Once | INTRAMUSCULAR | Status: AC
  Administered 2020-10-31: 40 mg via INTRAVENOUS
  Filled 2020-10-31: qty 4

## 2020-10-31 MED ORDER — FUROSEMIDE 10 MG/ML IJ SOLN
40.0000 mg | Freq: Two times a day (BID) | INTRAMUSCULAR | Status: DC
  Administered 2020-10-31 – 2020-11-04 (×8): 40 mg via INTRAVENOUS
  Filled 2020-10-31 (×8): qty 4

## 2020-10-31 MED ORDER — IPRATROPIUM-ALBUTEROL 0.5-2.5 (3) MG/3ML IN SOLN
3.0000 mL | Freq: Four times a day (QID) | RESPIRATORY_TRACT | Status: DC | PRN
  Administered 2020-11-01 – 2020-11-02 (×2): 3 mL via RESPIRATORY_TRACT
  Filled 2020-10-31 (×2): qty 3

## 2020-10-31 MED ORDER — ALBUTEROL SULFATE HFA 108 (90 BASE) MCG/ACT IN AERS
4.0000 | INHALATION_SPRAY | Freq: Once | RESPIRATORY_TRACT | Status: AC
  Administered 2020-10-31: 4 via RESPIRATORY_TRACT
  Filled 2020-10-31: qty 6.7

## 2020-10-31 MED ORDER — SERTRALINE HCL 50 MG PO TABS
100.0000 mg | ORAL_TABLET | Freq: Every day | ORAL | Status: DC
  Administered 2020-10-31 – 2020-11-04 (×5): 100 mg via ORAL
  Filled 2020-10-31 (×5): qty 2

## 2020-10-31 MED ORDER — PANTOPRAZOLE SODIUM 40 MG PO TBEC
40.0000 mg | DELAYED_RELEASE_TABLET | Freq: Every day | ORAL | Status: DC
  Administered 2020-10-31 – 2020-11-04 (×5): 40 mg via ORAL
  Filled 2020-10-31 (×5): qty 1

## 2020-10-31 MED ORDER — SODIUM CHLORIDE 0.9 % IV SOLN: 250.0000 mL | INTRAVENOUS | Status: DC | PRN

## 2020-10-31 MED ORDER — HEPARIN SODIUM (PORCINE) 5000 UNIT/ML IJ SOLN
5000.0000 [IU] | Freq: Three times a day (TID) | INTRAMUSCULAR | Status: DC
  Administered 2020-10-31 – 2020-11-04 (×12): 5000 [IU] via SUBCUTANEOUS
  Filled 2020-10-31 (×12): qty 1

## 2020-10-31 MED ORDER — SODIUM CHLORIDE 0.9% FLUSH: 3.0000 mL | INTRAVENOUS | Status: DC | PRN

## 2020-10-31 MED ORDER — METHYLPREDNISOLONE SODIUM SUCC 125 MG IJ SOLR
125.0000 mg | Freq: Once | INTRAMUSCULAR | Status: AC
  Administered 2020-10-31: 125 mg via INTRAVENOUS
  Filled 2020-10-31: qty 2

## 2020-10-31 MED ORDER — IOHEXOL 350 MG/ML SOLN
100.0000 mL | Freq: Once | INTRAVENOUS | Status: AC | PRN
  Administered 2020-10-31: 100 mL via INTRAVENOUS

## 2020-10-31 MED ORDER — ONDANSETRON HCL 4 MG/2ML IJ SOLN: 4.0000 mg | Freq: Four times a day (QID) | INTRAMUSCULAR | Status: DC | PRN

## 2020-10-31 MED ORDER — PREDNISONE 20 MG PO TABS
40.0000 mg | ORAL_TABLET | Freq: Every day | ORAL | Status: DC
  Administered 2020-11-01 – 2020-11-04 (×4): 40 mg via ORAL
  Filled 2020-10-31 (×4): qty 2

## 2020-10-31 MED ORDER — BUDESONIDE 0.5 MG/2ML IN SUSP
0.5000 mg | Freq: Two times a day (BID) | RESPIRATORY_TRACT | Status: DC
  Administered 2020-10-31 – 2020-11-04 (×8): 0.5 mg via RESPIRATORY_TRACT
  Filled 2020-10-31 (×7): qty 2

## 2020-10-31 MED ORDER — LOSARTAN POTASSIUM 50 MG PO TABS
25.0000 mg | ORAL_TABLET | Freq: Every day | ORAL | Status: DC
  Administered 2020-10-31 – 2020-11-04 (×5): 25 mg via ORAL
  Filled 2020-10-31 (×5): qty 1

## 2020-10-31 MED ORDER — ACETAMINOPHEN 325 MG PO TABS: 650.0000 mg | ORAL_TABLET | ORAL | Status: DC | PRN

## 2020-10-31 MED ORDER — SODIUM CHLORIDE 0.9% FLUSH
3.0000 mL | Freq: Two times a day (BID) | INTRAVENOUS | Status: DC
  Administered 2020-10-31 – 2020-11-04 (×8): 3 mL via INTRAVENOUS

## 2020-10-31 NOTE — H&P (Signed)
History and Physical    Ann Giles ZOX:[id number] DOB: December 20, 1971 DOA: 10/31/2020  PCP: April MansonWhite, Marsha L, NP   Patient coming from: home   I have personally briefly reviewed patient's [facility]  Chief Complaint: SOB  HPI: Ann Giles is a 49 y.o. female with medical history significant of morbid obesity, diastolic heart failure, hypertension, depression, history of asthma, obstructive sleep apnea (have been without the use of BiPAP for over a month now after recall on her equipment has been made) and gastroesophageal reflux disease; who presented to the emergency department secondary to shortness of breath.  Symptom has been present for more than 10 days and worsening.  Patient reports some worsening swelling on her extremities, orthopnea and dyspnea on exertion.  Denies fever, chills, nausea, vomiting, abdominal pain, dysuria, hematuria or any other complaints.  Patient reports no sick contacts and expressed being designated against Covid.  She reports compliance with medications.  ED Course: Vascular congestion, mild bilateral pleural effusion and pulmonary edema appreciated on CT scan.  Negative pulmonary embolism.  BNP equivocally low in patient with obesity.  Found to be hypoxic on room air, requiring 4-5 L nasal cannula supplementation and actively wheezing.  Nebulizer management, steroids and Lasix provided to patient.  Oxygen supplementation has been added and TRH consulted to me patient for further relation and management of asthma exacerbation, CHF exacerbation and pulmonary edema.  Review of Systems: As per HPI otherwise all other systems reviewed and are negative.   Past Medical History:  Diagnosis Date  . Arthritis   . Asthma   . Bronchitis   . CHF (congestive heart failure) (HCC)   . Depression   . Hypertension   . OSA (obstructive sleep apnea)     Past Surgical History:  Procedure Laterality Date  . ABDOMINAL HYSTERECTOMY     . CHOLECYSTECTOMY    . GASTROPLASTY      Social History  reports that she has never smoked. She has never used smokeless tobacco. She reports that she does not drink alcohol and does not use drugs.  No Known Allergies  Family History  Problem Relation Age of Onset  . Asthma Neg Hx     Prior to Admission medications   Medication Sig Start Date End Date Taking? Authorizing Provider  albuterol (PROVENTIL HFA;VENTOLIN HFA) 108 (90 Base) MCG/ACT inhaler Inhale 2 puffs into the lungs every 6 (six) hours as needed for wheezing or shortness of breath.   Yes [provider]  amLODipine (NORVASC) 10 MG tablet Take 1 tablet (10 mg total) by mouth daily. 12/26/17  Yes Tat, Onalee Huaavid, MD  furosemide (LASIX) 20 MG tablet Take 1 tablet (20 mg total) by mouth daily. May take additional 20 mg for swelling Daily 08/08/20 11/06/20 Yes Branch, Dorothe PeaJonathan F, MD  lisinopril (PRINIVIL,ZESTRIL) 2.5 MG tablet Take 1 tablet (2.5 mg total) by mouth daily. 12/27/17  Yes Tat, Onalee Huaavid, MD  omeprazole (PRILOSEC) 20 MG capsule Take 20 mg by mouth daily as needed (for acid reflux).    Yes [provider]  sertraline (ZOLOFT) 100 MG tablet Take 1 tablet (100 mg total) by mouth daily. 06/26/19  Beatrix ShipperYes Tat, David, MD    Physical Exam: Vitals:   10/31/20 1615 10/31/20 1630 10/31/20 1645 10/31/20 1700  BP:    (!) 145/84  Pulse: 67 66 64 65  Resp:    18  Temp:      TempSrc:      SpO2: (!) 88%  90% 90% 92%  Weight:      Height:        Constitutional: No fever, no chest pain, no nausea or vomiting.  During my evaluation expressed feeling slightly better after receiving steroids, nebulization and Lasix. Vitals:   10/31/20 1615 10/31/20 1630 10/31/20 1645 10/31/20 1700  BP:    (!) 145/84  Pulse: 67 66 64 65  Resp:    18  Temp:      TempSrc:      SpO2: (!) 88% 90% 90% 92%  Weight:      Height:       Eyes: PERRL, lids and conjunctivae normal; no icterus. ENMT: Mucous membranes are moist. Posterior pharynx  clear of any exudate or lesions.  Neck: normal, supple, no masses, no thyromegaly; unable to properly assess JVD with body habitus. Respiratory: Fine crackles appreciated at the bases; no using accessory muscles.  Tachypnea has been reported with minimal exertion.  Positive expiratory wheezing.  Requiring 4-5 L nasal cannula supplementation. Cardiovascular: Regular rate and rhythm, no murmurs / rubs / gallops.  Chronic 1-2+ lower extremity edema/lymphedema.  No carotid bruits.  Abdomen: Obese, soft, no tenderness, no masses palpated. No hepatosplenomegaly. Bowel sounds positive.  Musculoskeletal: no clubbing / cyanosis. No joint deformity upper and lower extremities. Good ROM, no contractures. Normal muscle tone.  Skin: no petechiae. Neurologic: CN 2-12 grossly intact. Sensation intact, DTR normal.  No focal deficits appreciated. Psychiatric: Normal judgment and insight. Alert and oriented x 3. Normal mood.   Labs on Admission: I have personally reviewed following labs and imaging studies  CBC: Recent Labs  Lab 10/31/20 1024  WBC 6.3  NEUTROABS 4.4  HGB 13.1  HCT 45.7  MCV 102.2*  PLT 258    Basic Metabolic Panel: Recent Labs  Lab 10/31/20 1024  NA 142  K 3.6  CL 96*  CO2 35*  GLUCOSE 108*  BUN 14  CREATININE 0.97  CALCIUM 8.7*    GFR: Estimated Creatinine Clearance: 120.6 mL/min (by C-G formula based on SCr of 0.97 mg/dL).  Liver Function Tests: Recent Labs  Lab 10/31/20 1024  AST 12*  ALT 14  ALKPHOS 62  BILITOT 1.6*  PROT 8.0  ALBUMIN 3.7    Urine analysis:    Component Value Date/Time   COLORURINE YELLOW 03/12/2018 1750   APPEARANCEUR CLEAR 03/12/2018 1750   LABSPEC 1.020 03/12/2018 1750   PHURINE 5.0 03/12/2018 1750   GLUCOSEU NEGATIVE 03/12/2018 1750   HGBUR SMALL (A) 03/12/2018 1750   BILIRUBINUR NEGATIVE 03/12/2018 1750   KETONESUR NEGATIVE 03/12/2018 1750   PROTEINUR NEGATIVE 03/12/2018 1750   NITRITE NEGATIVE 03/12/2018 1750   LEUKOCYTESUR  NEGATIVE 03/12/2018 1750    Radiological Exams on Admission: CT Angio Chest PE W/Cm &/Or Wo Cm  Result Date: 10/31/2020 CLINICAL DATA:  Shortness of breath for the past week and a half. Negative COVID 19 test. EXAM: CT ANGIOGRAPHY CHEST WITH CONTRAST TECHNIQUE: Multidetector CT imaging of the chest was performed using the standard protocol during bolus administration of intravenous contrast. Multiplanar CT image reconstructions and MIPs were obtained to evaluate the vascular anatomy. CONTRAST:  OMNIPAQUE IOHEXOL 350 MG/ML SOLN COMPARISON:  Chest x-ray from same day. CT chest dated June 23, 2019. FINDINGS: Cardiovascular: Suboptimal evaluation of the segmental pulmonary arteries due to contrast bolus timing and respiratory motion artifact. No evidence of central or lobar pulmonary embolism. Unchanged main pulmonary artery dilatation measuring up to 3.9 cm. Unchanged borderline cardiomegaly. No pericardial effusion. Mediastinum/Nodes: Prominent subcentimeter mediastinal  and bilateral hilar lymph nodes are similar to slightly increased in size since the prior study, likely reactive. No enlarged axillary lymph nodes. The thyroid gland, trachea, and esophagus demonstrate no significant findings. Lungs/Pleura: Small bilateral pleural effusions. Bilateral perihilar ground-glass densities. No pneumothorax. Bilateral lower lobe subsegmental atelectasis. Upper Abdomen: No acute abnormality. Postsurgical changes of the proximal stomach. Musculoskeletal: No chest wall abnormality. No acute or significant osseous findings. Review of the MIP images confirms the above findings. IMPRESSION: 1. No evidence of central or lobar pulmonary embolism. Suboptimal evaluation of the segmental pulmonary arteries due to contrast bolus timing and respiratory motion artifact. 2. Moderate pulmonary edema and small bilateral pleural effusions, consistent with congestive heart failure. 3. Unchanged main pulmonary artery dilatation,  suggestive of pulmonary arterial hypertension. Electronically Signed   By: Obie Dredge M.D.   On: 10/31/2020 12:21   DG Chest Portable 1 View  Result Date: 10/31/2020 CLINICAL DATA:  Shortness of breath for approximately 10 days. EXAM: PORTABLE CHEST 1 VIEW COMPARISON:  Single-view of the chest and CT chest 06/23/2019. PA and lateral chest 12/23/2017. FINDINGS: The exam is limited by the patient's size and portable technique. Lungs are grossly clear. Cardiomegaly. No pneumothorax or pleural effusion. IMPRESSION: Cardiomegaly without acute disease. Electronically Signed   By: Drusilla Kanner M.D.   On: 10/31/2020 10:25    EKG: Independently reviewed.  No acute ischemic changes appreciated.  Sinus rhythm.  Assessment/Plan 1-acute respiratory failure with hypoxia [facility]): Multifactorial, in the setting of acute on chronic diastolic heart failure, pulmonary edema and asthma exacerbation.  Patient also with underlying history of obstructive sleep apnea and most likely obesity hypoventilation syndrome. -Admitted to telemetry bed -Daily weights and strict I's and O's -IV Lasix (40 mg every 12 hours) -Close monitoring of renal function electrolytes -Continue the use of ARB; no beta-blocker due to history of asthma with active exacerbation currently. -Repeat 2D echo -Checking TSH and magnesium level. -Follow clinical response. -From asthma exacerbation standpoint patient will receive prednisone, DuoNeb and Pulmicort nebulization. -COVID test neg  2-Asthma -Patient above will treat with steroids, duo nebs and Pulmicort nebulizer -Titrate oxygen down as tolerated. -Currently requiring 4-5 L nasal supplementation.  3-Hypertension -Overall stable -Continue the use of losartan and Lasix -Holding Norvasc initially in order to have more room for diuresis. -Heart healthy/low-sodium diet encouraged.  4-Obesity, Class III, BMI 40-49.9 (morbid obesity) (HCC) -Body mass index is 59.15 kg/m. -Low  calorie diet, portion control and increase physical activity discussed with patient.  5-obstructive sleep apnea -Resume nightly BiPAP.  6-GERD -continue PPI  7-depression/anxiety -will resume zoloft -no SI or hallucinations.   DVT prophylaxis: Heparin Code Status:   Full code Family Communication:  No family at bedside. Disposition Plan:   Patient is from:  Home  Anticipated DC to:  Home (hopefully)  Anticipated DC date:  To be determined  Anticipated DC barrier:    Stabilization on her resp status and O2 sat.  Consultation:               none  Admission status:  Inpatient, telemetry bed, length of stay more than 2 midnights.  Severity of Illness: Moderate severity; patient with acute respiratory failure with hypoxia secondary to pulmonary edema, asthma exacerbation and acute on chronic diastolic heart failure.  Patient normally does not wear oxygen supplementation at home but is supposed to be on BiPAP nightly.  She has not been on her BiPAP for over a month secondary to a recall of her equipment.  On  presentation to ED oxygen saturation on room air in mid to low 80s.  Patient will be admitted for IV diuresis and further evaluation/management of CHF and asthma exacerbation.   Vassie Loll MD [facility]  How to contact the [facility] Attending or Consulting provider 7A - 7P or covering provider during after hours 7P -7A, for this patient?   1. Check the care team in [facility] and look for a) attending/consulting TRH provider listed and b) the [facility] team listed 2. Log into [email or web address] and use [facility]'s universal password to access. If you do not have the password, please contact the hospital operator. 3. Locate the [facility] provider you are looking for under [facility] and page to a number that you can be directly reached. 4. If you still have difficulty reaching the provider, please page the [facility] (Director on Call) for the Hospitalists listed on amion for  assistance.  10/31/2020, 5:36 PM

## 2020-10-31 NOTE — Progress Notes (Signed)
Patient admitted to room 309, able to ambulate short distance from stretcher to bed on 4L O2 [address] [place]. She is alert and oriented, reports no pain at this time. Meal tray warmed and provided to patient.   Tele applied vitals within normal limits. Will continue to monitor.

## 2020-10-31 NOTE — TOC Initial Note (Signed)
Transition of Care [facility]) - Initial/Assessment Note    Patient Details  Name: Ann Giles MRN: [id number] Date of Birth: Dec 21, 1970  Transition of Care [facility]) CM/SW Contact:    Villa Herb, LCSWA Phone Number: 10/31/2020, 8:35 PM  Clinical Narrative:                 Pt admitted for Acute respiratory failure with hypoxia. CSW visited pt in ED to complete assessment. Pt states she lives alone and is able to complete ADLs independently but slowly. Pt states that she drives herself. Pt states that she has never had HH services. Pt states that she is on home O2 supplied through [employer]. Pt also states she has a bipap but that is supplied through another company but she is not sure. CSW spoke with pt about PT possibly coming to work with her. CSW inquired about pts interest in SNF if PT recommends. Pt states that she will be willing to do what is recommended. TOC to follow for possible d/c needs.   TOC received consult for CHF screen. Pt states that she does not weigh herself daily. CSW provided pt with education on importance of daily weights and consulting with cardiologist of weight increase. Pt states that she tries to follow a heart healthy diet. Pt is followed by cardiologist Dr. Boneta Lucks. Pt states that she has been "lagging" on taking her medications specifically her lasix but has been working with Dr. Boneta Lucks on a new plan for her medications. TOC to follow.   Expected Discharge Plan: Home/Self Care Barriers to Discharge: Continued Medical Work up   Patient Goals and CMS Choice Patient states their goals for this hospitalization and ongoing recovery are:: Return home   Choice offered to / list presented to : NA  Expected Discharge Plan and Services Expected Discharge Plan: Home/Self Care In-house Referral: Clinical Social Work Discharge Planning Services: NA   Living arrangements for the past 2 months: Apartment                                      Prior  Living Arrangements/Services Living arrangements for the past 2 months: Apartment Lives with:: Self Patient language and need for interpreter reviewed:: Yes Do you feel safe going back to the place where you live?: Yes        Care giver support system in place?: Yes (comment) Lincoln Brigham (Mother) [phone]) Current home services: DME (cpap and O2) Criminal Activity/Legal Involvement Pertinent to Current Situation/Hospitalization: No - Comment as needed  Activities of Daily Living      Permission Sought/Granted                  Emotional Assessment Appearance:: Appears stated age Attitude/Demeanor/Rapport: Engaged Affect (typically observed): Accepting Orientation: : Oriented to Self, Oriented to Place, Oriented to  Time, Oriented to Situation Alcohol / Substance Use: Not Applicable Psych Involvement: No (comment)  Admission diagnosis:  Acute respiratory failure with hypoxia (HCC) [J96.01] Patient Active Problem List   Diagnosis Date Noted  . OSA treated with BiPAP 10/31/2020  . Community acquired pneumonia   . Acute on chronic diastolic CHF (congestive heart failure) (HCC) 06/23/2019  . Acute diastolic CHF (congestive heart failure) (HCC) 12/23/2017  . Obesity, Class III, BMI 40-49.9 (morbid obesity) (HCC) 12/23/2017  . Acute bronchospasm 09/09/2016  . Asthma 09/09/2016  . Hypertension 09/09/2016  . Depression 09/09/2016  . Leg swelling 09/09/2016  .  Acute respiratory failure with hypoxia (HCC) 09/09/2016  . Bronchospasm, acute 09/09/2016   PCP:  April Manson, NP Pharmacy:   [facility] [id number] - [place], [place] - [address] AT [facility] [address] [place] [address] Phone: [phone] Fax: [phone]  [id number] - [place], [place] - [address] AT [facility] OF [address] & [address] [place] [address] Phone: [phone] Fax: [phone]     Social  Determinants of Health (SDOH) Interventions    Readmission Risk Interventions Readmission Risk Prevention Plan 06/25/2019  Medication Screening Complete  Transportation Screening Complete  Some recent data might be hidden

## 2020-10-31 NOTE — ED Provider Notes (Signed)
[facility] EMERGENCY DEPARTMENT Provider Note   CSN: [id number] Arrival date & time: 10/31/20  4098     History Chief Complaint  Patient presents with   Shortness of Breath    Ann Giles is a 49 y.o. female with past medical history significant for asthma, bronchitis, CHF, hypertension, OSA.  Had Covid vaccinations. Echo 06/2019 with LVED 55-60%.  HPI Patient presents to emergency department today with chief complaint of shortness of breath x1.5 weeks.  She states it has been progressively worsening.  She is also endorsing wheezing and chest tightness.  She states the chest tightness has been present since and since symptom onset as well.  She denies chest tightness being worse with exertion.  Patient states she lives in an apartment and is unable to walk up flight of stairs to her residence without having to stop multiple times to catch her breath.  This is unusual for her.  She is supposed to wear BiPAP at home however for the last month or so she has not been using it because every recall of the equipment.  Does not wear home O2. She has doubled her Lasix dose for the last 1 week however that did not help her shortness of breath. She denies any known covid exposure or sick contacts. Denies fever, chills, congestion, hemoptysis, abdominal pain, back pain, lower extremity edema, urinary symptoms, diarrhea.     Past Medical History:  Diagnosis Date   Arthritis    Asthma    Bronchitis    CHF (congestive heart failure) (HCC)    Depression    Hypertension    OSA (obstructive sleep apnea)     Patient Active Problem List   Diagnosis Date Noted   Community acquired pneumonia    Acute on chronic diastolic CHF (congestive heart failure) (HCC) 06/23/2019   Acute diastolic CHF (congestive heart failure) (HCC) 12/23/2017   Obesity, Class III, BMI 40-49.9 (morbid obesity) (HCC) 12/23/2017   Acute bronchospasm 09/09/2016   Asthma 09/09/2016   Hypertension  09/09/2016   Depression 09/09/2016   Leg swelling 09/09/2016   Acute respiratory failure with hypoxia (HCC) 09/09/2016   Bronchospasm, acute 09/09/2016    Past Surgical History:  Procedure Laterality Date   ABDOMINAL HYSTERECTOMY     CHOLECYSTECTOMY     GASTROPLASTY       OB History   No obstetric history on file.     Family History  Problem Relation Age of Onset   Asthma Neg Hx     Social History   Tobacco Use   Smoking status: Never Smoker   Smokeless tobacco: Never Used  Vaping Use   Vaping Use: Never assessed  Substance Use Topics   Alcohol use: No   Drug use: No    Home Medications Prior to Admission medications   Medication Sig Start Date End Date Taking? Authorizing Provider  albuterol (PROVENTIL HFA;VENTOLIN HFA) 108 (90 Base) MCG/ACT inhaler Inhale 2 puffs into the lungs every 6 (six) hours as needed for wheezing or shortness of breath.   Yes [provider]  amLODipine (NORVASC) 10 MG tablet Take 1 tablet (10 mg total) by mouth daily. 12/26/17  Yes Tat, Onalee Hua, MD  furosemide (LASIX) 20 MG tablet Take 1 tablet (20 mg total) by mouth daily. May take additional 20 mg for swelling Daily 08/08/20 11/06/20 Yes Branch, Dorothe Pea, MD  lisinopril (PRINIVIL,ZESTRIL) 2.5 MG tablet Take 1 tablet (2.5 mg total) by mouth daily. 12/27/17  Yes TatOnalee Hua, MD  omeprazole (PRILOSEC)  20 MG capsule Take 20 mg by mouth daily as needed (for acid reflux).    Yes [provider]  sertraline (ZOLOFT) 100 MG tablet Take 1 tablet (100 mg total) by mouth daily. 06/26/19  Yes TatOnalee Hua, MD    Allergies    Patient has no known allergies.  Review of Systems   Review of Systems All other systems are reviewed and are negative for acute change except as noted in the HPI.  Physical Exam Updated Vital Signs BP (!) 178/95 (BP Location: Right Wrist)    Pulse 81    Temp 98.6 F (37 C) (Oral)    Resp (!) 34    Ht 5\' 8"  (1.727 m)    Wt (!) 176.4 kg    SpO2 (!) 81%     BMI 59.15 kg/m   Physical Exam Vitals and nursing note reviewed.  Constitutional:      Appearance: She is obese. She is not ill-appearing or toxic-appearing.  HENT:     Head: Normocephalic and atraumatic.     Right Ear: Tympanic membrane and external ear normal.     Left Ear: Tympanic membrane and external ear normal.     Nose: Nose normal.     Mouth/Throat:     Mouth: Mucous membranes are moist.     Pharynx: Oropharynx is clear.  Eyes:     General: No scleral icterus.       Right eye: No discharge.        Left eye: No discharge.     Extraocular Movements: Extraocular movements intact.     Conjunctiva/sclera: Conjunctivae normal.     Pupils: Pupils are equal, round, and reactive to light.  Neck:     Vascular: No JVD.  Cardiovascular:     Rate and Rhythm: Normal rate and regular rhythm.     Pulses: Normal pulses.          Radial pulses are 2+ on the right side and 2+ on the left side.     Heart sounds: Normal heart sounds.  Pulmonary:     Effort: Respiratory distress present.     Breath sounds: Wheezing present.     Comments: Symmetric chest rise.  Expiratory wheeze heard in all lung fields.  She is speaking in short sentences.  Oxygen saturation on room air 64%.  No nasal flaring, does have accessory muscle usage. Abdominal:     Comments: Abdomen is soft, non-distended, and non-tender in all quadrants. No rigidity, no guarding. No peritoneal signs.  Musculoskeletal:        General: Normal range of motion.     Cervical back: Normal range of motion.     Right lower leg: No edema.     Left lower leg: No edema.  Skin:    General: Skin is warm and dry.     Capillary Refill: Capillary refill takes less than 2 seconds.  Neurological:     Mental Status: She is oriented to person, place, and time.     GCS: GCS eye subscore is 4. GCS verbal subscore is 5. GCS motor subscore is 6.     Comments: Fluent speech, no facial droop.  Psychiatric:        Behavior: Behavior normal.      ED Results / Procedures / Treatments   Labs (all labs ordered are listed, but only abnormal results are displayed) Labs Reviewed  COMPREHENSIVE METABOLIC PANEL - Abnormal; Notable for the following components:      Result Value  Chloride 96 (*)    CO2 35 (*)    Glucose, Bld 108 (*)    Calcium 8.7 (*)    AST 12 (*)    Total Bilirubin 1.6 (*)    All other components within normal limits  CBC WITH DIFFERENTIAL/PLATELET - Abnormal; Notable for the following components:   MCV 102.2 (*)    MCHC 28.7 (*)    RDW 17.4 (*)    nRBC 0.6 (*)    Abs Immature Granulocytes 0.10 (*)    All other components within normal limits  RESP PANEL BY RT PCR (RSV, FLU A&B, COVID)  BRAIN NATRIURETIC PEPTIDE  TROPONIN I (HIGH SENSITIVITY)  TROPONIN I (HIGH SENSITIVITY)    EKG EKG Interpretation  Date/Time:  Friday October 31 2020 09:56:11 EST Ventricular Rate:  70 PR Interval:    QRS Duration: 93 QT Interval:  413 QTC Calculation: 446 R Axis:   100 Text Interpretation: Sinus rhythm Right axis deviation Borderline T abnormalities, diffuse leads since last tracing no significant change Confirmed by Mancel Bale [phone]) on 10/31/2020 10:30:57 AM   Radiology CT Angio Chest PE W/Cm &/Or Wo Cm  Result Date: 10/31/2020 CLINICAL DATA:  Shortness of breath for the past week and a half. Negative COVID 19 test. EXAM: CT ANGIOGRAPHY CHEST WITH CONTRAST TECHNIQUE: Multidetector CT imaging of the chest was performed using the standard protocol during bolus administration of intravenous contrast. Multiplanar CT image reconstructions and MIPs were obtained to evaluate the vascular anatomy. CONTRAST:  OMNIPAQUE IOHEXOL 350 MG/ML SOLN COMPARISON:  Chest x-ray from same day. CT chest dated June 23, 2019. FINDINGS: Cardiovascular: Suboptimal evaluation of the segmental pulmonary arteries due to contrast bolus timing and respiratory motion artifact. No evidence of central or lobar pulmonary embolism.  Unchanged main pulmonary artery dilatation measuring up to 3.9 cm. Unchanged borderline cardiomegaly. No pericardial effusion. Mediastinum/Nodes: Prominent subcentimeter mediastinal and bilateral hilar lymph nodes are similar to slightly increased in size since the prior study, likely reactive. No enlarged axillary lymph nodes. The thyroid gland, trachea, and esophagus demonstrate no significant findings. Lungs/Pleura: Small bilateral pleural effusions. Bilateral perihilar ground-glass densities. No pneumothorax. Bilateral lower lobe subsegmental atelectasis. Upper Abdomen: No acute abnormality. Postsurgical changes of the proximal stomach. Musculoskeletal: No chest wall abnormality. No acute or significant osseous findings. Review of the MIP images confirms the above findings. IMPRESSION: 1. No evidence of central or lobar pulmonary embolism. Suboptimal evaluation of the segmental pulmonary arteries due to contrast bolus timing and respiratory motion artifact. 2. Moderate pulmonary edema and small bilateral pleural effusions, consistent with congestive heart failure. 3. Unchanged main pulmonary artery dilatation, suggestive of pulmonary arterial hypertension. Electronically Signed   By: Obie Dredge M.D.   On: 10/31/2020 12:21   DG Chest Portable 1 View  Result Date: 10/31/2020 CLINICAL DATA:  Shortness of breath for approximately 10 days. EXAM: PORTABLE CHEST 1 VIEW COMPARISON:  Single-view of the chest and CT chest 06/23/2019. PA and lateral chest 12/23/2017. FINDINGS: The exam is limited by the patient's size and portable technique. Lungs are grossly clear. Cardiomegaly. No pneumothorax or pleural effusion. IMPRESSION: Cardiomegaly without acute disease. Electronically Signed   By: Drusilla Kanner M.D.   On: 10/31/2020 10:25    Procedures .Critical Care Performed by: Shanon Ace, PA-C Authorized by: Shanon Ace, PA-C   Critical care provider statement:    Critical care  time (minutes):  32   Critical care time was exclusive of:  Teaching time and separately billable procedures and  treating other patients   Critical care was necessary to treat or prevent imminent or life-threatening deterioration of the following conditions:  Respiratory failure   Critical care was time spent personally by me on the following activities:  Development of treatment plan with patient or surrogate, evaluation of patient's response to treatment, examination of patient, obtaining history from patient or surrogate, ordering and performing treatments and interventions, ordering and review of laboratory studies, ordering and review of radiographic studies, pulse oximetry, re-evaluation of patient's condition and review of old charts   (including critical care time)  Medications Ordered in ED Medications  heparin injection 5,000 Units (has no administration in time range)  methylPREDNISolone sodium succinate (SOLU-MEDROL) 125 mg/2 mL injection 125 mg (125 mg Intravenous Given 10/31/20 1033)  albuterol (VENTOLIN HFA) 108 (90 Base) MCG/ACT inhaler 4-6 puff (4 puffs Inhalation Given 10/31/20 1034)  ipratropium-albuterol (DUONEB) 0.5-2.5 (3) MG/3ML nebulizer solution 3 mL (3 mLs Nebulization Given 10/31/20 1225)  iohexol (OMNIPAQUE) 350 MG/ML injection 100 mL (100 mLs Intravenous Contrast Given 10/31/20 1157)  furosemide (LASIX) injection 40 mg (40 mg Intravenous Given 10/31/20 1313)    ED Course  I have reviewed the triage vital signs and the nursing notes.  Pertinent labs & imaging results that were available during my care of the patient were reviewed by me and considered in my medical decision making (see chart for details).  Clinical Course as of Oct 31 1320  Fri Oct 31, 2020  1250 DG Chest Portable 1 View [EW]  [phone] Patient reassessed, with faint expiratory wheezing although improved from arrival.  Currently on 3 L nasal cannula with oxygen saturation 95%.   [KW]    Clinical  Course User Index [EW] Mancel Bale, MD [KW] Kandice Hams   MDM Rules/Calculators/A&P                          History provided by patient with additional history obtained from chart review.    49 yo female presenting in respiratory distress. Afebrile, hypoxic to 64 % on room air and tachypneic. Placed on 5L [place] with improvement to 93%.  Lung exam reveals expiratory wheezing heard in all lung fields.  She is talking in short sentences.  No abdominal tenderness, no peritoneal signs.  No lower extremity edema.  DDx includes Covid, pneumonia, PE, asthma exacerbation, less likely be tension or spontaneous pneumothorax given timeframe. CBC nonacute.  CMP with no significant electrolyte derangement, does have elevated bicarb at 35 which appears consistent with her baseline, no renal insufficiency.  BNP within normal range as well at 27.  Troponin 6, doubt ACS. Covid and influenza tests are negative.  EKG shows no ischemic changes compared to prior.  Chest x-ray viewed by me shows cardiomegaly without any obvious infectious infiltrates. Reassessed patient after receiving Solu-Medrol and albuterol.  She continues to have expiratory wheezing, slightly improved compared to arrival.  Will give DuoNeb. Given degree of hypoxia CTA chest obtained and shows  moderate pulmonary edema and small bilateral pleural effusions, consistent with congestive heart failure. Active for PE. Patient given Lasix. Given hypoxia with new oxygen requirement patient will require admission. Findings and plan of care discussed with supervising physician Dr. Effie Shy who agrees with plan. Spoke with Dr. Gwenlyn Perking with hospitalist service who agrees to assume care of patient and bring into the hospital for further evaluation and management.     Portions of this note were generated with Scientist, clinical (histocompatibility and immunogenetics). Dictation errors  may occur despite best attempts at proofreading.   Final Clinical Impression(s) / ED  Diagnoses Final diagnoses:  Acute congestive heart failure, unspecified heart failure type [facility](HCC)  Hypoxia    Rx / DC Orders ED Discharge Orders    None       Kandice HamsWalisiewicz, Timberlee Roblero E, PA-C 10/31/20 1321    Mancel BaleWentz, Elliott, MD 11/01/20 [phone]

## 2020-10-31 NOTE — ED Triage Notes (Signed)
Pt presents to ED for shortness of breath x 1.5 weeks, denies fever or cough.

## 2020-11-01 ENCOUNTER — Inpatient Hospital Stay (HOSPITAL_COMMUNITY): Payer: Self-pay

## 2020-11-01 DIAGNOSIS — I5031 Acute diastolic (congestive) heart failure: Secondary | ICD-10-CM

## 2020-11-01 DIAGNOSIS — I5033 Acute on chronic diastolic (congestive) heart failure: Secondary | ICD-10-CM | POA: Diagnosis not present

## 2020-11-01 DIAGNOSIS — I10 Essential (primary) hypertension: Secondary | ICD-10-CM | POA: Diagnosis not present

## 2020-11-01 DIAGNOSIS — J4521 Mild intermittent asthma with (acute) exacerbation: Secondary | ICD-10-CM | POA: Diagnosis not present

## 2020-11-01 DIAGNOSIS — J9601 Acute respiratory failure with hypoxia: Secondary | ICD-10-CM | POA: Diagnosis not present

## 2020-11-01 LAB — BASIC METABOLIC PANEL
Anion gap: 10 (ref 5–15)
BUN: 17 mg/dL (ref 6–20)
CO2: 38 mmol/L — ABNORMAL HIGH (ref 22–32)
Calcium: 8.9 mg/dL (ref 8.9–10.3)
Chloride: 93 mmol/L — ABNORMAL LOW (ref 98–111)
Creatinine, Ser: 0.94 mg/dL (ref 0.44–1.00)
GFR, Estimated: >60 mL/min (ref >60)
Glucose, Bld: 113 mg/dL — ABNORMAL HIGH (ref 70–99)
Potassium: 3.9 mmol/L (ref 3.5–5.1)
Sodium: 141 mmol/L (ref 135–145)

## 2020-11-01 LAB — ECHOCARDIOGRAM COMPLETE
Area-P 1/2: 3.76 cm2
Height: 68 in
S' Lateral: 3.11 cm
Weight: 6225.79 oz

## 2020-11-01 LAB — HIV ANTIBODY (ROUTINE TESTING W REFLEX): HIV Screen 4th Generation wRfx: NONREACTIVE

## 2020-11-01 MED ORDER — METOLAZONE 5 MG PO TABS
5.0000 mg | ORAL_TABLET | Freq: Every day | ORAL | Status: AC
  Administered 2020-11-01 – 2020-11-02 (×2): 5 mg via ORAL
  Filled 2020-11-01: qty 1

## 2020-11-01 MED ORDER — METOLAZONE 5 MG PO TABS: 5.0000 mg | ORAL_TABLET | Freq: Every day | ORAL | Status: DC

## 2020-11-01 MED ORDER — INFLUENZA VAC SPLIT QUAD 0.5 ML IM SUSY
0.5000 mL | PREFILLED_SYRINGE | INTRAMUSCULAR | Status: DC
  Filled 2020-11-01: qty 0.5

## 2020-11-01 NOTE — Progress Notes (Signed)
  Echocardiogram 2D Echocardiogram has been performed.  Ann Giles 11/01/2020, 8:43 AM

## 2020-11-01 NOTE — Progress Notes (Signed)
PROGRESS NOTE    Ann Elbert EwingsL Giles  XBJ:[id number] DOB: [birth year] DOA: 10/31/2020 PCP: April MansonWhite, Marsha L, NP   Chief Complaint  Patient presents with  . Shortness of Breath    Brief Narrative:  Ann Giles is a 49 y.o. female with medical history significant of morbid obesity, diastolic heart failure, hypertension, depression, history of asthma, obstructive sleep apnea (have been without the use of BiPAP for over a month now after recall on her equipment has been made) and gastroesophageal reflux disease; who presented to the emergency department secondary to shortness of breath.  Symptom has been present for more than 10 days and worsening.  Patient reports some worsening swelling on her extremities, orthopnea and dyspnea on exertion.  Denies fever, chills, nausea, vomiting, abdominal pain, dysuria, hematuria or any other complaints.  Patient reports no sick contacts and expressed being designated against Covid.  She reports compliance with medications.  ED Course: Vascular congestion, mild bilateral pleural effusion and pulmonary edema appreciated on CT scan.  Negative pulmonary embolism.  BNP equivocally low in patient with obesity.  Found to be hypoxic on room air, requiring 4-5 L nasal cannula supplementation and actively wheezing.  Nebulizer management, steroids and Lasix provided to patient.  Oxygen supplementation has been added and TRH consulted to me patient for further relation and management of asthma exacerbation, CHF exacerbation and pulmonary edema.  Assessment & Plan: 1-acute respiratory failure with hypoxia (HCC) -In the setting of acute on chronic diastolic heart failure and asthma exacerbation -Patient with underlying history obstructive sleep apnea and most likely OHS. -Continue IV diuretics, daily weights and strict I's and O's -Continue low-sodium diet. -Will continue the use of his steroids, nebulizer management and using flutter valve/incentive  respirometer. -Wean oxygen supplementation as tolerated. -Extensive discussion about weight loss and bariatric assessment as an outpatient provided. -Continue CPAP/BiPAP nightly.  2-hypertension: -Stable and well-controlled currently -Will continue the use of current antihypertensive agents.  3-morbid obesity -Body mass index is 59.16 kg/m. -Low calorie diet, portion control and increase physical activity discussed with patient. -Will benefit of outpatient evaluation and further management at Bariatric clinic.  4-OSA treated with BiPAP -continue CPAP/BIPAP QHS -patient needs to resume home usage of this equipments at home.  5-GERD -continue PPI  6-depression -continue zoloft   DVT prophylaxis: Heparin Code Status: Full code Family Communication: No family at bedside Disposition:   Status is: Inpatient  Dispo: The patient is from: home              Anticipated d/c is to: home              Anticipated d/c date is: 2 days or so.              Patient currently no medically stable for discharge.  Still having difficulty speaking in full sentences, complaining of orthopnea and shortness of breath with activity.  She is also requiring oxygen supplementation (which is new for her).  Continue IV diuresis, steroids/nebulizer management for asthma exacerbation and continue the use of CPAP/BiPAP nightly to assist with obstructive sleep apnea management.    Consultants:   None   Procedures:  See below for x-ray reports 2D echo: Pending  Antimicrobials:  None  Subjective: Mild difficulty speaking in full sentences, complaining of shortness of breath with activity and reporting orthopnea.  No fever, no chest pain.  Objective: Vitals:   11/01/20 0500 11/01/20 0621 11/01/20 0900 11/01/20 1106  BP:   139/78   Pulse:  87   Resp: 18  16   Temp:   98.6 F (37 C)   TempSrc:   Oral   SpO2: 97% 94% 93% 92%  Weight: (!) 176.5 kg     Height:        Intake/Output Summary  (Last 24 hours) at 11/01/2020 1156 Last data filed at 11/01/2020 0900 Gross per 24 hour  Intake 480 ml  Output 1 ml  Net 479 ml   Filed Weights   10/31/20 0944 11/01/20 0500  Weight: (!) 176.4 kg (!) 176.5 kg    Examination:  General exam: No fever, no chest pain, no nausea or vomiting.  Patient reports some improvement in her breathing; still requiring oxygen supplementation and feeling short of breath with activity.  Mild difficulty speaking in full sentences appreciated and positive orthopnea has been reported. Respiratory system: Positive respiratory wheezing; no using accessory muscles.  Fine crackles at the bases. Cardiovascular system: S1 & S2 heard, RRR.  No rubs or gallops.  Unable to assess JVD with body habitus. Gastrointestinal system: Abdomen is obese, nondistended, soft and nontender. No organomegaly or masses felt. Normal bowel sounds heard. Central nervous system: Alert and oriented. No focal neurological deficits. Extremities: Cyanosis or clubbing; 1+ edema bilaterally. Skin: No petechiae. Psychiatry: Judgement and insight appear normal. Mood & affect appropriate.    Data Reviewed: I have personally reviewed following labs and imaging studies  CBC: Recent Labs  Lab 10/31/20 1024  WBC 6.3  NEUTROABS 4.4  HGB 13.1  HCT 45.7  MCV 102.2*  PLT 258    Basic Metabolic Panel: Recent Labs  Lab 10/31/20 1024 10/31/20 1225 11/01/20 0705  NA 142  --  141  K 3.6  --  3.9  CL 96*  --  93*  CO2 35*  --  38*  GLUCOSE 108*  --  113*  BUN 14  --  17  CREATININE 0.97  --  0.94  CALCIUM 8.7*  --  8.9  MG  --  1.8  --     GFR: Estimated Creatinine Clearance: 124.5 mL/min (by C-G formula based on SCr of 0.94 mg/dL).  Liver Function Tests: Recent Labs  Lab 10/31/20 1024  AST 12*  ALT 14  ALKPHOS 62  BILITOT 1.6*  PROT 8.0  ALBUMIN 3.7    CBG: No results for input(s): GLUCAP in the last 168 hours.   Recent Results (from the past 240 hour(s))  Resp  Panel by RT PCR (RSV, Flu A&B, Covid) - Nasopharyngeal Swab     Status: None   Collection Time: 10/31/20 10:17 AM   Specimen: Nasopharyngeal Swab  Result Value Ref Range Status   SARS Coronavirus 2 by RT PCR NEGATIVE NEGATIVE Final    Comment: (NOTE) SARS-CoV-2 target nucleic acids are NOT DETECTED.  The SARS-CoV-2 RNA is generally detectable in upper respiratoy specimens during the acute phase of infection. The lowest concentration of SARS-CoV-2 viral copies this assay can detect is 131 copies/mL. A negative result does not preclude SARS-Cov-2 infection and should not be used as the sole basis for treatment or other patient management decisions. A negative result may occur with  improper specimen collection/handling, submission of specimen other than nasopharyngeal swab, presence of viral mutation(s) within the areas targeted by this assay, and inadequate number of viral copies (<131 copies/mL). A negative result must be combined with clinical observations, patient history, and epidemiological information. The expected result is Negative.  Fact Sheet for Patients:  https://www.moore.com/  Fact Sheet for Healthcare  Providers:  https://www.young.biz/  This test is no t yet approved or cleared by the [place] and  has been authorized for detection and/or diagnosis of SARS-CoV-2 by FDA under an Emergency Use Authorization (EUA). This EUA will remain  in effect (meaning this test can be used) for the duration of the COVID-19 declaration under Section 564(b)(1) of the Act, 21 U.S.C. section 360bbb-3(b)(1), unless the authorization is terminated or revoked sooner.     Influenza A by PCR NEGATIVE NEGATIVE Final   Influenza B by PCR NEGATIVE NEGATIVE Final    Comment: (NOTE) The Xpert Xpress SARS-CoV-2/FLU/RSV assay is intended as an aid in  the diagnosis of influenza from Nasopharyngeal swab specimens and  should not be used as a sole  basis for treatment. Nasal washings and  aspirates are unacceptable for Xpert Xpress SARS-CoV-2/FLU/RSV  testing.  Fact Sheet for Patients: https://www.moore.com/  Fact Sheet for Healthcare Providers: https://www.young.biz/  This test is not yet approved or cleared by the [place] FDA and  has been authorized for detection and/or diagnosis of SARS-CoV-2 by  FDA under an Emergency Use Authorization (EUA). This EUA will remain  in effect (meaning this test can be used) for the duration of the  Covid-19 declaration under Section 564(b)(1) of the Act, 21  U.S.C. section 360bbb-3(b)(1), unless the authorization is  terminated or revoked.    Respiratory Syncytial Virus by PCR NEGATIVE NEGATIVE Final    Comment: (NOTE) Fact Sheet for Patients: https://www.moore.com/  Fact Sheet for Healthcare Providers: https://www.young.biz/  This test is not yet approved or cleared by the [place] FDA and  has been authorized for detection and/or diagnosis of SARS-CoV-2 by  FDA under an Emergency Use Authorization (EUA). This EUA will remain  in effect (meaning this test can be used) for the duration of the  COVID-19 declaration under Section 564(b)(1) of the Act, 21 U.S.C.  section 360bbb-3(b)(1), unless the authorization is terminated or  revoked. Performed at [facility], [address] [place], [place] [address]      Radiology Studies: CT Angio Chest PE W/Cm &/Or Wo Cm  Result Date: 10/31/2020 CLINICAL DATA:  Shortness of breath for the past week and a half. Negative COVID 19 test. EXAM: CT ANGIOGRAPHY CHEST WITH CONTRAST TECHNIQUE: Multidetector CT imaging of the chest was performed using the standard protocol during bolus administration of intravenous contrast. Multiplanar CT image reconstructions and MIPs were obtained to evaluate the vascular anatomy. CONTRAST:  OMNIPAQUE IOHEXOL 350 MG/ML SOLN  COMPARISON:  Chest x-ray from same day. CT chest dated June 23, 2019. FINDINGS: Cardiovascular: Suboptimal evaluation of the segmental pulmonary arteries due to contrast bolus timing and respiratory motion artifact. No evidence of central or lobar pulmonary embolism. Unchanged main pulmonary artery dilatation measuring up to 3.9 cm. Unchanged borderline cardiomegaly. No pericardial effusion. Mediastinum/Nodes: Prominent subcentimeter mediastinal and bilateral hilar lymph nodes are similar to slightly increased in size since the prior study, likely reactive. No enlarged axillary lymph nodes. The thyroid gland, trachea, and esophagus demonstrate no significant findings. Lungs/Pleura: Small bilateral pleural effusions. Bilateral perihilar ground-glass densities. No pneumothorax. Bilateral lower lobe subsegmental atelectasis. Upper Abdomen: No acute abnormality. Postsurgical changes of the proximal stomach. Musculoskeletal: No chest wall abnormality. No acute or significant osseous findings. Review of the MIP images confirms the above findings. IMPRESSION: 1. No evidence of central or lobar pulmonary embolism. Suboptimal evaluation of the segmental pulmonary arteries due to contrast bolus timing and respiratory motion artifact. 2. Moderate pulmonary edema and small  bilateral pleural effusions, consistent with congestive heart failure. 3. Unchanged main pulmonary artery dilatation, suggestive of pulmonary arterial hypertension. Electronically Signed   By: Obie Dredge M.D.   On: 10/31/2020 12:21   DG Chest Portable 1 View  Result Date: 10/31/2020 CLINICAL DATA:  Shortness of breath for approximately 10 days. EXAM: PORTABLE CHEST 1 VIEW COMPARISON:  Single-view of the chest and CT chest 06/23/2019. PA and lateral chest 12/23/2017. FINDINGS: The exam is limited by the patient's size and portable technique. Lungs are grossly clear. Cardiomegaly. No pneumothorax or pleural effusion. IMPRESSION: Cardiomegaly without  acute disease. Electronically Signed   By: Drusilla Kanner M.D.   On: 10/31/2020 10:25    Scheduled Meds: . budesonide (PULMICORT) nebulizer solution  0.5 mg Nebulization BID  . furosemide  40 mg Intravenous BID  . heparin injection (subcutaneous)  5,000 Units Subcutaneous Q8H  . [START ON 11/02/2020] influenza vac split quadrivalent PF  0.5 mL Intramuscular Tomorrow-1000  . losartan  25 mg Oral Daily  . metolazone  5 mg Oral Daily  . pantoprazole  40 mg Oral Daily  . predniSONE  40 mg Oral Q breakfast  . sertraline  100 mg Oral Daily  . sodium chloride flush  3 mL Intravenous Q12H   Continuous Infusions: . sodium chloride       LOS: 1 day    Time spent: 30 minutes   Vassie Loll, MD [facility]   To contact the attending provider between 7A-7P or the covering provider during after hours 7P-7A, please log into the web site [email or web address] and access using universal [place] password for that web site. If you do not have the password, please call the hospital operator.  11/01/2020, 11:56 AM

## 2020-11-02 DIAGNOSIS — I10 Essential (primary) hypertension: Secondary | ICD-10-CM | POA: Diagnosis not present

## 2020-11-02 DIAGNOSIS — J4521 Mild intermittent asthma with (acute) exacerbation: Secondary | ICD-10-CM | POA: Diagnosis not present

## 2020-11-02 DIAGNOSIS — I5033 Acute on chronic diastolic (congestive) heart failure: Secondary | ICD-10-CM | POA: Diagnosis not present

## 2020-11-02 DIAGNOSIS — J9601 Acute respiratory failure with hypoxia: Secondary | ICD-10-CM | POA: Diagnosis not present

## 2020-11-02 LAB — BASIC METABOLIC PANEL
Anion gap: 8 (ref 5–15)
BUN: 23 mg/dL — ABNORMAL HIGH (ref 6–20)
CO2: 41 mmol/L — ABNORMAL HIGH (ref 22–32)
Calcium: 8.8 mg/dL — ABNORMAL LOW (ref 8.9–10.3)
Chloride: 91 mmol/L — ABNORMAL LOW (ref 98–111)
Creatinine, Ser: 0.93 mg/dL (ref 0.44–1.00)
GFR, Estimated: >60 mL/min (ref >60)
Glucose, Bld: 88 mg/dL (ref 70–99)
Potassium: 3.5 mmol/L (ref 3.5–5.1)
Sodium: 140 mmol/L (ref 135–145)

## 2020-11-02 MED ORDER — HYDROCORTISONE (PERIANAL) 2.5 % EX CREA
TOPICAL_CREAM | Freq: Three times a day (TID) | CUTANEOUS | Status: DC
  Filled 2020-11-02: qty 28.35

## 2020-11-02 NOTE — Progress Notes (Signed)
PROGRESS NOTE    Ann Giles  TDV:[id number] DOB: [birth year] DOA: 10/31/2020 PCP: Ann Manson, NP   Chief Complaint  Patient presents with   Shortness of Breath    Brief Narrative:  Ann Giles is a 49 y.o. female with medical history significant of morbid obesity, diastolic heart failure, hypertension, depression, history of asthma, obstructive sleep apnea (have been without the use of BiPAP for over a month now after recall on her equipment has been made) and gastroesophageal reflux disease; who presented to the emergency department secondary to shortness of breath.  Symptom has been present for more than 10 days and worsening.  Patient reports some worsening swelling on her extremities, orthopnea and dyspnea on exertion.  Denies fever, chills, nausea, vomiting, abdominal pain, dysuria, hematuria or any other complaints.  Patient reports no sick contacts and expressed being designated against Covid.  She reports compliance with medications.  ED Course: Vascular congestion, mild bilateral pleural effusion and pulmonary edema appreciated on CT scan.  Negative pulmonary embolism.  BNP equivocally low in patient with obesity.  Found to be hypoxic on room air, requiring 4-5 L nasal cannula supplementation and actively wheezing.  Nebulizer management, steroids and Lasix provided to patient.  Oxygen supplementation has been added and TRH consulted to me patient for further relation and management of asthma exacerbation, CHF exacerbation and pulmonary edema.  Assessment & Plan: 1-acute respiratory failure with hypoxia (HCC) -In the setting of acute on chronic diastolic heart failure and asthma exacerbation -Patient with underlying history obstructive sleep apnea and most likely OHS. -Continue IV diuretics, daily weights and strict I's and O's -2 dosages of metolazone will be added to further assist with diuresis. -Continue low-sodium diet. -Will continue the use of his  steroids, nebulizer management and using flutter valve/incentive respirometer. -Wean oxygen supplementation as tolerated. -Extensive discussion about weight loss and bariatric assessment as an outpatient provided. -Continue CPAP/BiPAP nightly.  2-hypertension: -Stable and well-controlled currently -Will continue the use of current antihypertensive agents.  3-morbid obesity -Body mass index is 59.2 kg/m. -Low calorie diet, portion control and increase physical activity discussed with patient. -Will benefit of outpatient evaluation and further management at Bariatric clinic.  4-OSA treated with BiPAP -continue CPAP/BIPAP QHS -patient needs to resume home usage of this equipments at home.  5-GERD -continue PPI  6-depression -continue zoloft   DVT prophylaxis: Heparin Code Status: Full code Family Communication: Mother at bedside. Disposition:   Status is: Inpatient  Dispo: The patient is from: home              Anticipated d/c is to: home              Anticipated d/c date is: 1-2 days              Patient currently no medically stable for discharge.  Still having shortness of breath with activity, requiring 2 L nasal cannula supplementation and having mild orthopnea.  Overall slowly improving and reporting better tolerance to activity.  Speaking in full sentences today.  Continue diuresis, steroids, nebulizer management and the use of CPAP/BiPAP nightly.    Consultants:   None   Procedures:  See below for x-ray reports 2D echo:  1. Left ventricular ejection fraction, by estimation, is 60 to 65%. The  left ventricle has normal function. The left ventricle has no regional  wall motion abnormalities. There is moderate concentric left ventricular  hypertrophy. Left ventricular  diastolic parameters are indeterminate.  2. Right ventricular systolic  function is normal. The right ventricular  size is normal. There is moderately elevated pulmonary artery systolic    pressure. The estimated right ventricular systolic pressure is 40.8 mmHg.  3. The mitral valve is normal in structure. No evidence of mitral valve  regurgitation. No evidence of mitral stenosis.  4. The aortic valve is normal in structure. Aortic valve regurgitation is  not visualized. No aortic stenosis is present.  5. The inferior vena cava is dilated in size with <50% respiratory  variability, suggesting right atrial pressure of 15 mmHg.   Antimicrobials:  None  Subjective: Patient expressed shortness of breath with activity, still having mild orthopnea.  2 L nasal cannula in place.  Slowly improving and noticing decrease in overall swelling and better tolerance while doing activity from a breathing standpoint.  Was able to speak in full sentences today.  Objective: Vitals:   11/01/20 2348 11/02/20 0203 11/02/20 0514 11/02/20 0851  BP:   (!) 147/85   Pulse: (!) 51  (!) 48   Resp: 18  19   Temp:   97.9 F (36.6 C)   TempSrc:      SpO2: 93%  97% 95%  Weight:  (!) 176.6 kg    Height:        Intake/Output Summary (Last 24 hours) at 11/02/2020 1245 Last data filed at 11/01/2020 1700 Gross per 24 hour  Intake 600 ml  Output --  Net 600 ml   Filed Weights   10/31/20 0944 11/01/20 0500 11/02/20 0203  Weight: (!) 176.4 kg (!) 176.5 kg (!) 176.6 kg    Examination: General exam: No fever, no chest pain, no nausea, no vomiting.  Reports breathing continued to improve; also noticing decrease in overall swelling.  Mild orthopnea still reported and is using 2 L nasal cannula supplementation.  Patient expressed feeling short of breath with activity; but is noticing better tolerance. Respiratory system: No wheezing appreciated today; positive crackles at the bases, positive rhonchi bilaterally also heard.  No using accessory muscles. Cardiovascular system:RRR. No murmurs, rubs, gallops.  Unable to properly assess JVD due to body habitus. Gastrointestinal system: Abdomen is obese  nondistended, soft and nontender. No organomegaly or masses felt. Normal bowel sounds heard. Central nervous system: Alert and oriented. No focal neurological deficits. Extremities: No cyanosis or clubbing; 1+ edema appreciated bilaterally Skin: No rashes, no petechiae. Psychiatry: Judgement and insight appear normal. Mood & affect appropriate.     Data Reviewed: I have personally reviewed following labs and imaging studies  CBC: Recent Labs  Lab 10/31/20 1024  WBC 6.3  NEUTROABS 4.4  HGB 13.1  HCT 45.7  MCV 102.2*  PLT 258    Basic Metabolic Panel: Recent Labs  Lab 10/31/20 1024 10/31/20 1225 11/01/20 0705 11/02/20 0622  NA 142  --  141 140  K 3.6  --  3.9 3.5  CL 96*  --  93* 91*  CO2 35*  --  38* 41*  GLUCOSE 108*  --  113* 88  BUN 14  --  17 23*  CREATININE 0.97  --  0.94 0.93  CALCIUM 8.7*  --  8.9 8.8*  MG  --  1.8  --   --     GFR: Estimated Creatinine Clearance: 125.9 mL/min (by C-G formula based on SCr of 0.93 mg/dL).  Liver Function Tests: Recent Labs  Lab 10/31/20 1024  AST 12*  ALT 14  ALKPHOS 62  BILITOT 1.6*  PROT 8.0  ALBUMIN 3.7    CBG:  No results for input(s): GLUCAP in the last 168 hours.   Recent Results (from the past 240 hour(s))  Resp Panel by RT PCR (RSV, Flu A&B, Covid) - Nasopharyngeal Swab     Status: None   Collection Time: 10/31/20 10:17 AM   Specimen: Nasopharyngeal Swab  Result Value Ref Range Status   SARS Coronavirus 2 by RT PCR NEGATIVE NEGATIVE Final    Comment: (NOTE) SARS-CoV-2 target nucleic acids are NOT DETECTED.  The SARS-CoV-2 RNA is generally detectable in upper respiratoy specimens during the acute phase of infection. The lowest concentration of SARS-CoV-2 viral copies this assay can detect is 131 copies/mL. A negative result does not preclude SARS-Cov-2 infection and should not be used as the sole basis for treatment or other patient management decisions. A negative result may occur with  improper  specimen collection/handling, submission of specimen other than nasopharyngeal swab, presence of viral mutation(s) within the areas targeted by this assay, and inadequate number of viral copies (<131 copies/mL). A negative result must be combined with clinical observations, patient history, and epidemiological information. The expected result is Negative.  Fact Sheet for Patients:  https://www.moore.com/  Fact Sheet for Healthcare Providers:  https://www.young.biz/  This test is no t yet approved or cleared by the [place] FDA and  has been authorized for detection and/or diagnosis of SARS-CoV-2 by FDA under an Emergency Use Authorization (EUA). This EUA will remain  in effect (meaning this test can be used) for the duration of the COVID-19 declaration under Section 564(b)(1) of the Act, 21 U.S.C. section 360bbb-3(b)(1), unless the authorization is terminated or revoked sooner.     Influenza A by PCR NEGATIVE NEGATIVE Final   Influenza B by PCR NEGATIVE NEGATIVE Final    Comment: (NOTE) The Xpert Xpress SARS-CoV-2/FLU/RSV assay is intended as an aid in  the diagnosis of influenza from Nasopharyngeal swab specimens and  should not be used as a sole basis for treatment. Nasal washings and  aspirates are unacceptable for Xpert Xpress SARS-CoV-2/FLU/RSV  testing.  Fact Sheet for Patients: https://www.moore.com/  Fact Sheet for Healthcare Providers: https://www.young.biz/  This test is not yet approved or cleared by the [place] FDA and  has been authorized for detection and/or diagnosis of SARS-CoV-2 by  FDA under an Emergency Use Authorization (EUA). This EUA will remain  in effect (meaning this test can be used) for the duration of the  Covid-19 declaration under Section 564(b)(1) of the Act, 21  U.S.C. section 360bbb-3(b)(1), unless the authorization is  terminated or revoked.     Respiratory Syncytial Virus by PCR NEGATIVE NEGATIVE Final    Comment: (NOTE) Fact Sheet for Patients: https://www.moore.com/  Fact Sheet for Healthcare Providers: https://www.young.biz/  This test is not yet approved or cleared by the [place] FDA and  has been authorized for detection and/or diagnosis of SARS-CoV-2 by  FDA under an Emergency Use Authorization (EUA). This EUA will remain  in effect (meaning this test can be used) for the duration of the  COVID-19 declaration under Section 564(b)(1) of the Act, 21 U.S.C.  section 360bbb-3(b)(1), unless the authorization is terminated or  revoked. Performed at [facility], [address] [place], [place] [address]      Radiology Studies: ECHOCARDIOGRAM COMPLETE  Result Date: 11/01/2020    ECHOCARDIOGRAM REPORT   Patient Name:   ANDE THERRELL Date of Exam: 11/01/2020 Medical Rec #:  [id number]           Height:  68.0 in Accession #:    [id number]          Weight:       389.1 lb Date of Birth:  [birth year]          BSA:          2.712 m Patient Age:    48 years            BP:           136/77 mmHg Patient Gender: F                   HR:           83 bpm. [facility] Procedure: 2D Echo, Cardiac Doppler and Color Doppler Indications:    CHF  History:        Patient has prior history of Echocardiogram examinations, most                 recent 06/24/2019. CHF, Signs/Symptoms:Shortness of Breath and                 Dyspnea; Risk Factors:Sleep Apnea, Hypertension and Morbid                 obesity.  Sonographer:    Lavenia Atlas RDCS Referring Phys: [phone] Charlisa Cham IMPRESSIONS  1. Left ventricular ejection fraction, by estimation, is 60 to 65%. The left ventricle has normal function. The left ventricle has no regional wall motion abnormalities. There is moderate concentric left ventricular hypertrophy. Left ventricular diastolic parameters are indeterminate.  2. Right ventricular  systolic function is normal. The right ventricular size is normal. There is moderately elevated pulmonary artery systolic pressure. The estimated right ventricular systolic pressure is 40.8 mmHg.  3. The mitral valve is normal in structure. No evidence of mitral valve regurgitation. No evidence of mitral stenosis.  4. The aortic valve is normal in structure. Aortic valve regurgitation is not visualized. No aortic stenosis is present.  5. The inferior vena cava is dilated in size with <50% respiratory variability, suggesting right atrial pressure of 15 mmHg. FINDINGS  Left Ventricle: Left ventricular ejection fraction, by estimation, is 60 to 65%. The left ventricle has normal function. The left ventricle has no regional wall motion abnormalities. The left ventricular internal cavity size was normal in size. There is  moderate concentric left ventricular hypertrophy. Left ventricular diastolic parameters are indeterminate. Normal left ventricular filling pressure. Right Ventricle: The right ventricular size is normal. No increase in right ventricular wall thickness. Right ventricular systolic function is normal. There is moderately elevated pulmonary artery systolic pressure. The tricuspid regurgitant velocity is 2.54 m/s, and with an assumed right atrial pressure of 15 mmHg, the estimated right ventricular systolic pressure is 40.8 mmHg. Left Atrium: Left atrial size was normal in size. Right Atrium: Right atrial size was normal in size. Pericardium: There is no evidence of pericardial effusion. Mitral Valve: The mitral valve is normal in structure. No evidence of mitral valve regurgitation. No evidence of mitral valve stenosis. Tricuspid Valve: The tricuspid valve is normal in structure. Tricuspid valve regurgitation is trivial. No evidence of tricuspid stenosis. Aortic Valve: The aortic valve is normal in structure. Aortic valve regurgitation is not visualized. No aortic stenosis is present. Pulmonic Valve: The  pulmonic valve was normal in structure. Pulmonic valve regurgitation is not visualized. No evidence of pulmonic stenosis. Aorta: The aortic root is normal in size and structure. Venous: The inferior vena cava is dilated in  size with less than 50% respiratory variability, suggesting right atrial pressure of 15 mmHg. IAS/Shunts: No atrial level shunt detected by color flow Doppler.  LEFT VENTRICLE PLAX 2D LVIDd:         5.08 cm  Diastology LVIDs:         3.11 cm  LV e' medial:    8.05 cm/s LV PW:         1.37 cm  LV E/e' medial:  9.7 LV IVS:        1.33 cm  LV e' lateral:   6.09 cm/s LVOT diam:     2.30 cm  LV E/e' lateral: 12.9 LV SV:         122 LV SV Index:   45 LVOT Area:     4.15 cm  RIGHT VENTRICLE RV Basal diam:  3.74 cm RV S prime:     9.46 cm/s TAPSE (M-mode): 3.0 cm LEFT ATRIUM             Index       RIGHT ATRIUM           Index LA diam:        4.10 cm 1.51 cm/m  RA Area:     23.50 cm LA Vol (A2C):   55.9 ml 20.61 ml/m RA Volume:   85.60 ml  31.56 ml/m LA Vol (A4C):   52.3 ml 19.29 ml/m LA Biplane Vol: 59.5 ml 21.94 ml/m  AORTIC VALVE LVOT Vmax:   136.00 cm/s LVOT Vmean:  97.400 cm/s LVOT VTI:    0.293 m  AORTA Ao Root diam: 2.60 cm MITRAL VALVE               TRICUSPID VALVE MV Area (PHT): 3.76 cm    TR Peak grad:   25.8 mmHg MV Decel Time: 202 msec    TR Vmax:        254.00 cm/s MV E velocity: 78.40 cm/s MV A velocity: 67.30 cm/s  SHUNTS MV E/A ratio:  1.16        Systemic VTI:  0.29 m                            Systemic Diam: 2.30 cm Armanda Magicraci Turner MD Electronically signed by Armanda Magicraci Turner MD Signature Date/Time: 11/01/2020/1:23:04 PM    Final     Scheduled Meds:  budesonide (PULMICORT) nebulizer solution  0.5 mg Nebulization BID   furosemide  40 mg Intravenous BID   heparin injection (subcutaneous)  5,000 Units Subcutaneous Q8H   influenza vac split quadrivalent PF  0.5 mL Intramuscular Tomorrow-1000   losartan  25 mg Oral Daily   pantoprazole  40 mg Oral Daily   predniSONE  40 mg  Oral Q breakfast   sertraline  100 mg Oral Daily   sodium chloride flush  3 mL Intravenous Q12H   Continuous Infusions:  sodium chloride       LOS: 2 days    Time spent: 30 minutes   Vassie Lollarlos Jameka Ivie, MD [facility]   To contact the attending provider between 7A-7P or the covering provider during after hours 7P-7A, please log into the web site [email or web address] and access using universal [place] password for that web site. If you do not have the password, please call the hospital operator.  11/02/2020, 12:45 PM

## 2020-11-03 DIAGNOSIS — I10 Essential (primary) hypertension: Secondary | ICD-10-CM | POA: Diagnosis not present

## 2020-11-03 DIAGNOSIS — J4521 Mild intermittent asthma with (acute) exacerbation: Secondary | ICD-10-CM | POA: Diagnosis not present

## 2020-11-03 DIAGNOSIS — I5033 Acute on chronic diastolic (congestive) heart failure: Secondary | ICD-10-CM | POA: Diagnosis not present

## 2020-11-03 DIAGNOSIS — J9601 Acute respiratory failure with hypoxia: Secondary | ICD-10-CM | POA: Diagnosis not present

## 2020-11-03 LAB — BASIC METABOLIC PANEL
Anion gap: 9 (ref 5–15)
BUN: 24 mg/dL — ABNORMAL HIGH (ref 6–20)
CO2: 42 mmol/L — ABNORMAL HIGH (ref 22–32)
Calcium: 9.3 mg/dL (ref 8.9–10.3)
Chloride: 88 mmol/L — ABNORMAL LOW (ref 98–111)
Creatinine, Ser: 0.96 mg/dL (ref 0.44–1.00)
GFR, Estimated: >60 mL/min (ref >60)
Glucose, Bld: 88 mg/dL (ref 70–99)
Potassium: 3.3 mmol/L — ABNORMAL LOW (ref 3.5–5.1)
Sodium: 139 mmol/L (ref 135–145)

## 2020-11-03 MED ORDER — POTASSIUM CHLORIDE CRYS ER 20 MEQ PO TBCR
40.0000 meq | EXTENDED_RELEASE_TABLET | Freq: Once | ORAL | Status: AC
  Administered 2020-11-03: 40 meq via ORAL
  Filled 2020-11-03: qty 2

## 2020-11-03 NOTE — Progress Notes (Signed)
SATURATION QUALIFICATIONS: (This note is used to comply with regulatory documentation for home oxygen)  Patient Saturations on Room Air at Rest = 89%  Patient Saturations on Room Air while Ambulating = 82%  Patient Saturations on 4 Liters of oxygen while Ambulating = 91%  Please briefly explain why patient needs home oxygen: While resting on 2L pt sats-95%, while ambulating on 2L pt sats 84%. In order to achieve 02 sat >90 while ambulating/with actitivty pt needed 4L and achieved an sat of 91%. Pt becomes very fatigue after activity.

## 2020-11-03 NOTE — Progress Notes (Signed)
PROGRESS NOTE    Ann Giles  QMG:[id number] DOB: Mar 20, 1971 DOA: 10/31/2020 PCP: April Manson, NP   Chief Complaint  Patient presents with  . Shortness of Breath    Brief Narrative:  Ann Giles is a 49 y.o. female with medical history significant of morbid obesity, diastolic heart failure, hypertension, depression, history of asthma, obstructive sleep apnea (have been without the use of BiPAP for over a month now after recall on her equipment has been made) and gastroesophageal reflux disease; who presented to the emergency department secondary to shortness of breath.  Symptom has been present for more than 10 days and worsening.  Patient reports some worsening swelling on her extremities, orthopnea and dyspnea on exertion.  Denies fever, chills, nausea, vomiting, abdominal pain, dysuria, hematuria or any other complaints.  Patient reports no sick contacts and expressed being designated against Covid.  She reports compliance with medications.  ED Course: Vascular congestion, mild bilateral pleural effusion and pulmonary edema appreciated on CT scan.  Negative pulmonary embolism.  BNP equivocally low in patient with obesity.  Found to be hypoxic on room air, requiring 4-5 L nasal cannula supplementation and actively wheezing.  Nebulizer management, steroids and Lasix provided to patient.  Oxygen supplementation has been added and TRH consulted to me patient for further relation and management of asthma exacerbation, CHF exacerbation and pulmonary edema.  Assessment & Plan: 1-acute respiratory failure with hypoxia (HCC) -In the setting of acute on chronic diastolic heart failure and asthma exacerbation -Patient with underlying history obstructive sleep apnea and most likely OHS. -Continue IV diuretics for another 24 hours, daily weights and strict I's and O's -Continue low-sodium diet. -Will continue the use of his steroids, nebulizer management and using flutter  valve/incentive respirometer. -Wean oxygen supplementation as tolerated.  Arrange for home oxygen supplementation. -Extensive discussion about weight loss and bariatric assessment as an outpatient provided. -Continue CPAP/BiPAP nightly.  2-hypertension: -Stable and well-controlled currently -Will continue the use of current antihypertensive agents.  3-morbid obesity -Body mass index is 59.2 kg/m. -Low calorie diet, portion control and increase physical activity discussed with patient. -Will benefit of outpatient evaluation and further management at Bariatric clinic.  4-OSA treated with BiPAP -continue CPAP/BIPAP QHS -patient needs to resume home usage of this equipments at home.  5-GERD -continue PPI  6-depression -continue zoloft   DVT prophylaxis: Heparin Code Status: Full code Family Communication: Mother at bedside. Disposition:   Status is: Inpatient  Dispo: The patient is from: home              Anticipated d/c is to: home              Anticipated d/c date is: 1 day              Patient currently no medically stable for discharge.  Still having shortness of breath with activity, requiring 2 L nasal cannula at rest and 4 L with activity.  Patient describes shortness of breath with exertion and mild orthopnea..  Overall slowly improving and reporting better tolerance to activity.  Continue speaking in full sentences today.  Continue diuresis, steroids, nebulizer management and the use of CPAP/BiPAP nightly.    Consultants:   None   Procedures:  See below for x-ray reports 2D echo:  1. Left ventricular ejection fraction, by estimation, is 60 to 65%. The  left ventricle has normal function. The left ventricle has no regional  wall motion abnormalities. There is moderate concentric left ventricular  hypertrophy.  Left ventricular  diastolic parameters are indeterminate.  2. Right ventricular systolic function is normal. The right ventricular  size is normal.  There is moderately elevated pulmonary artery systolic  pressure. The estimated right ventricular systolic pressure is 40.8 mmHg.  3. The mitral valve is normal in structure. No evidence of mitral valve  regurgitation. No evidence of mitral stenosis.  4. The aortic valve is normal in structure. Aortic valve regurgitation is  not visualized. No aortic stenosis is present.  5. The inferior vena cava is dilated in size with <50% respiratory  variability, suggesting right atrial pressure of 15 mmHg.   Antimicrobials:  None  Subjective: Patient reports good urine output; no chest pain, no nausea, no vomiting.  Still short of breath with activity and desaturating on 2 L to 84%.  On room air patient at rest 89% and with activity 82%.  2 L supplementation to patient's above 92% while resting in the requiring 4 L while performing activity to keep her O2 sat above 90%.  Objective: Vitals:   11/03/20 0419 11/03/20 0805 11/03/20 0853 11/03/20 1436  BP: (!) 145/96  133/68 119/73  Pulse: (!) 54  (!) 56 70  Resp: 19  18 20   Temp: 98 F (36.7 C)   97.6 F (36.4 C)  TempSrc:      SpO2: 97% 97% 97% 92%  Weight:      Height:        Intake/Output Summary (Last 24 hours) at 11/03/2020 1450 Last data filed at 11/03/2020 1441 Gross per 24 hour  Intake 483 ml  Output 4100 ml  Net -3617 ml   Filed Weights   10/31/20 0944 11/01/20 0500 11/02/20 0203  Weight: (!) 176.4 kg (!) 176.5 kg (!) 176.6 kg    Examination: General exam: Alert, awake, oriented x 3; still experiencing shortness of breath and desaturation with activity.  Mild orthopnea also described.  No chest pain, no nausea, no vomiting, able to speak in full sentences. Respiratory system: Fine crackles at the bases; no using accessory muscles.  No wheezing. Cardiovascular system:RRR. No murmurs, rubs, gallops. Unable to assess JVD.  Gastrointestinal system: Abdomen is obese, nondistended, soft and nontender. No organomegaly or masses  felt. Normal bowel sounds heard. Central nervous system: Alert and oriented. No focal neurological deficits. Extremities: No cyanosis or clubbing; trace edema appreciated bilaterally. Skin: No rashes, no petechiae Psychiatry: Judgement and insight appear normal. Mood & affect appropriate.     Data Reviewed: I have personally reviewed following labs and imaging studies  CBC: Recent Labs  Lab 10/31/20 1024  WBC 6.3  NEUTROABS 4.4  HGB 13.1  HCT 45.7  MCV 102.2*  PLT 258    Basic Metabolic Panel: Recent Labs  Lab 10/31/20 1024 10/31/20 1225 11/01/20 0705 11/02/20 0622 11/03/20 0706  NA 142  --  141 140 139  K 3.6  --  3.9 3.5 3.3*  CL 96*  --  93* 91* 88*  CO2 35*  --  38* 41* 42*  GLUCOSE 108*  --  113* 88 88  BUN 14  --  17 23* 24*  CREATININE 0.97  --  0.94 0.93 0.96  CALCIUM 8.7*  --  8.9 8.8* 9.3  MG  --  1.8  --   --   --     GFR: Estimated Creatinine Clearance: 122 mL/min (by C-G formula based on SCr of 0.96 mg/dL).  Liver Function Tests: Recent Labs  Lab 10/31/20 1024  AST 12*  ALT 14  ALKPHOS 62  BILITOT 1.6*  PROT 8.0  ALBUMIN 3.7    CBG: No results for input(s): GLUCAP in the last 168 hours.   Recent Results (from the past 240 hour(s))  Resp Panel by RT PCR (RSV, Flu A&B, Covid) - Nasopharyngeal Swab     Status: None   Collection Time: 10/31/20 10:17 AM   Specimen: Nasopharyngeal Swab  Result Value Ref Range Status   SARS Coronavirus 2 by RT PCR NEGATIVE NEGATIVE Final    Comment: (NOTE) SARS-CoV-2 target nucleic acids are NOT DETECTED.  The SARS-CoV-2 RNA is generally detectable in upper respiratoy specimens during the acute phase of infection. The lowest concentration of SARS-CoV-2 viral copies this assay can detect is 131 copies/mL. A negative result does not preclude SARS-Cov-2 infection and should not be used as the sole basis for treatment or other patient management decisions. A negative result may occur with  improper specimen  collection/handling, submission of specimen other than nasopharyngeal swab, presence of viral mutation(s) within the areas targeted by this assay, and inadequate number of viral copies (<131 copies/mL). A negative result must be combined with clinical observations, patient history, and epidemiological information. The expected result is Negative.  Fact Sheet for Patients:  https://www.moore.com/https://www.fda.gov/media/142436/download  Fact Sheet for Healthcare Providers:  https://www.young.biz/https://www.fda.gov/media/142435/download  This test is no t yet approved or cleared by the [place] FDA and  has been authorized for detection and/or diagnosis of SARS-CoV-2 by FDA under an Emergency Use Authorization (EUA). This EUA will remain  in effect (meaning this test can be used) for the duration of the COVID-19 declaration under Section 564(b)(1) of the Act, 21 U.S.C. section 360bbb-3(b)(1), unless the authorization is terminated or revoked sooner.     Influenza A by PCR NEGATIVE NEGATIVE Final   Influenza B by PCR NEGATIVE NEGATIVE Final    Comment: (NOTE) The Xpert Xpress SARS-CoV-2/FLU/RSV assay is intended as an aid in  the diagnosis of influenza from Nasopharyngeal swab specimens and  should not be used as a sole basis for treatment. Nasal washings and  aspirates are unacceptable for Xpert Xpress SARS-CoV-2/FLU/RSV  testing.  Fact Sheet for Patients: https://www.moore.com/https://www.fda.gov/media/142436/download  Fact Sheet for Healthcare Providers: https://www.young.biz/https://www.fda.gov/media/142435/download  This test is not yet approved or cleared by the [place] FDA and  has been authorized for detection and/or diagnosis of SARS-CoV-2 by  FDA under an Emergency Use Authorization (EUA). This EUA will remain  in effect (meaning this test can be used) for the duration of the  Covid-19 declaration under Section 564(b)(1) of the Act, 21  U.S.C. section 360bbb-3(b)(1), unless the authorization is  terminated or revoked.    Respiratory  Syncytial Virus by PCR NEGATIVE NEGATIVE Final    Comment: (NOTE) Fact Sheet for Patients: https://www.moore.com/https://www.fda.gov/media/142436/download  Fact Sheet for Healthcare Providers: https://www.young.biz/https://www.fda.gov/media/142435/download  This test is not yet approved or cleared by the [place] FDA and  has been authorized for detection and/or diagnosis of SARS-CoV-2 by  FDA under an Emergency Use Authorization (EUA). This EUA will remain  in effect (meaning this test can be used) for the duration of the  COVID-19 declaration under Section 564(b)(1) of the Act, 21 U.S.C.  section 360bbb-3(b)(1), unless the authorization is terminated or  revoked. Performed at [facility], [address] [place], [place] [address]      Radiology Studies: No results found.  Scheduled Meds: . budesonide (PULMICORT) nebulizer solution  0.5 mg Nebulization BID  . furosemide  40 mg Intravenous BID  . heparin injection (subcutaneous)  5,000 Units Subcutaneous Q8H  . hydrocortisone   Rectal TID  . influenza vac split quadrivalent PF  0.5 mL Intramuscular Tomorrow-1000  . losartan  25 mg Oral Daily  . pantoprazole  40 mg Oral Daily  . predniSONE  40 mg Oral Q breakfast  . sertraline  100 mg Oral Daily  . sodium chloride flush  3 mL Intravenous Q12H   Continuous Infusions: . sodium chloride       LOS: 3 days    Time spent: 30 minutes   Vassie Loll, MD [facility]   To contact the attending provider between 7A-7P or the covering provider during after hours 7P-7A, please log into the web site [email or web address] and access using universal [place] password for that web site. If you do not have the password, please call the hospital operator.  11/03/2020, 2:50 PM

## 2020-11-03 NOTE — Progress Notes (Signed)
Pt stated she already has home O2 set up through [facility]. Pt also stated there were concerns about her insurance and she has [employer] not Medicaid. Pt also stated she has developed a twitch that occurs several times a day this admission. Pt requested documentation

## 2020-11-03 NOTE — Progress Notes (Signed)
Oxygen sat while resting and ambulating completed. Oxygen sats while resting on 2L 95%, oxygen sats while ambulating/activity 84% 2L. MD notified.

## 2020-11-04 DIAGNOSIS — E662 Morbid (severe) obesity with alveolar hypoventilation: Secondary | ICD-10-CM

## 2020-11-04 DIAGNOSIS — R0902 Hypoxemia: Secondary | ICD-10-CM

## 2020-11-04 DIAGNOSIS — J9601 Acute respiratory failure with hypoxia: Secondary | ICD-10-CM | POA: Diagnosis not present

## 2020-11-04 DIAGNOSIS — I5033 Acute on chronic diastolic (congestive) heart failure: Secondary | ICD-10-CM | POA: Diagnosis not present

## 2020-11-04 DIAGNOSIS — J4521 Mild intermittent asthma with (acute) exacerbation: Secondary | ICD-10-CM | POA: Diagnosis not present

## 2020-11-04 DIAGNOSIS — K219 Gastro-esophageal reflux disease without esophagitis: Secondary | ICD-10-CM

## 2020-11-04 LAB — BASIC METABOLIC PANEL
Anion gap: 11 (ref 5–15)
BUN: 31 mg/dL — ABNORMAL HIGH (ref 6–20)
CO2: 42 mmol/L — ABNORMAL HIGH (ref 22–32)
Calcium: 9.5 mg/dL (ref 8.9–10.3)
Chloride: 87 mmol/L — ABNORMAL LOW (ref 98–111)
Creatinine, Ser: 1.03 mg/dL — ABNORMAL HIGH (ref 0.44–1.00)
GFR, Estimated: >60 mL/min (ref >60)
Glucose, Bld: 89 mg/dL (ref 70–99)
Potassium: 3.3 mmol/L — ABNORMAL LOW (ref 3.5–5.1)
Sodium: 140 mmol/L (ref 135–145)

## 2020-11-04 MED ORDER — PREDNISONE 20 MG PO TABS
ORAL_TABLET | ORAL | 0 refills | Status: DC
Start: 2020-11-04 — End: 2021-07-09

## 2020-11-04 MED ORDER — HYDROCORTISONE (PERIANAL) 2.5 % EX CREA
TOPICAL_CREAM | Freq: Three times a day (TID) | CUTANEOUS | 0 refills | Status: DC
Start: 2020-11-04 — End: 2021-07-09

## 2020-11-04 MED ORDER — LOSARTAN POTASSIUM 25 MG PO TABS
25.0000 mg | ORAL_TABLET | Freq: Every day | ORAL | 2 refills | Status: DC
Start: 2020-11-04 — End: 2024-01-31

## 2020-11-04 MED ORDER — OMEPRAZOLE 40 MG PO CPDR
40.0000 mg | DELAYED_RELEASE_CAPSULE | Freq: Every day | ORAL | 2 refills | Status: DC
Start: 2020-11-04 — End: 2021-07-09

## 2020-11-04 MED ORDER — INFLUENZA VAC SPLIT QUAD 0.5 ML IM SUSY
0.5000 mL | PREFILLED_SYRINGE | INTRAMUSCULAR | Status: AC
  Administered 2020-11-04: 0.5 mL via INTRAMUSCULAR
  Filled 2020-11-04: qty 0.5

## 2020-11-04 MED ORDER — FUROSEMIDE 40 MG PO TABS: 40.0000 mg | ORAL_TABLET | Freq: Two times a day (BID) | ORAL | 2 refills | Status: AC

## 2020-11-04 NOTE — Progress Notes (Signed)
Pt discharged via WC to POV with all belongings in her possession. Pt has home O2 tank for transport home.

## 2020-11-04 NOTE — TOC Progression Note (Signed)
Transition of Care [facility]) - Progression Note    Patient Details  Name: Ann Giles MRN: [id number] Date of Birth: 09-Oct-1971  [facility] CM/SW Contact  Karn Cass[place], [place] Phone Number: 11/04/2020, 11:58 AM  Clinical Narrative: Pt will require 4L home O2. Pt reports she already has oxygen set up at home through [facility]. LCSW verified with Ashly at [facility] that she has continuous oxygen. Pt requesting new tubing. [facility] will overnight to pt. Pt aware. She states whoever picks her up will bring her portable O2 for transport. No other needs reported.     Expected Discharge Plan: Home/Self Care Barriers to Discharge: Continued Medical Work up  Expected Discharge Plan and Services Expected Discharge Plan: Home/Self Care In-house Referral: Clinical Social Work Discharge Planning Services: NA   Living arrangements for the past 2 months: Apartment Expected Discharge Date: 11/04/20                                     Social Determinants of Health (SDOH) Interventions    Readmission Risk Interventions Readmission Risk Prevention Plan 06/25/2019  Medication Screening Complete  Transportation Screening Complete  Some recent data might be hidden

## 2020-11-04 NOTE — Discharge Summary (Signed)
Physician Discharge Summary  Ann Giles ZOX:[id number] DOB: 1971/08/26 DOA: 10/31/2020  PCP: April Manson, NP  Admit date: 10/31/2020 Discharge date: 11/04/2020  Time spent: 35 minutes  Recommendations for Outpatient Follow-up:  1. Repeat basic metabolic panel to follow across renal function 2. Reassess patient volume status and further adjust diuretic regimen as needed. 3. Continue to assist patient with weight loss management and refer to bariatric clinic if needed. 4. Reassess blood pressure and further adjust antihypertensive regimen as required.   Discharge Diagnoses:  Principal Problem:   Acute respiratory failure with hypoxia (HCC) Active Problems:   Asthma   Hypertension   Obesity, Class III, BMI 40-49.9 (morbid obesity) (HCC)   Acute on chronic diastolic CHF (congestive heart failure) (HCC)   OSA treated with BiPAP   Hypoxia   Obesity hypoventilation syndrome (HCC)   Gastroesophageal reflux disease   Discharge Condition: Stable and improved.  Discharged home with instructions to follow-up with PCP in 10 days.  CODE STATUS: Full code.  Diet recommendation: Low calorie and low sodium diet.  Filed Weights   10/31/20 0944 11/01/20 0500 11/02/20 0203  Weight: (!) 176.4 kg (!) 176.5 kg (!) 176.6 kg    History of present illness:  Ann L Collingtonis a 49 y.o.femalewith medical history significant ofmorbid obesity, diastolic heart failure, hypertension, depression, history of asthma, obstructive sleep apnea (have been without the use of BiPAP for over a month now after recall on her equipment has been made)andgastroesophageal reflux disease;who presented to the emergency department secondary to shortness of breath. Symptom has been present for more than 10 days and worsening. Patient reports some worsening swelling on her extremities,orthopnea and dyspnea on exertion. Denies fever, chills, nausea, vomiting, abdominal pain, dysuria, hematuria or any  other complaints. Patient reports no sick contacts and expressed being designated against Covid. She reports compliance with medications.  ED Course:Vascular congestion, mild bilateral pleural effusion and pulmonary edema appreciated on CT scan. Negative pulmonary embolism.BNP equivocallylow inpatient with obesity. Found to be hypoxic on room air, requiring 4-5 L nasal cannula supplementation and actively wheezing. Nebulizer management, steroids and Lasix provided to patient. Oxygen supplementation has been added and TRH consulted to me patient for further relation and management of asthma exacerbation, CHF exacerbation and pulmonary edema.  Hospital Course:  1-acute respiratory failure with hypoxia (HCC) -In the setting of acute on chronic diastolic heart failure and asthma exacerbation -Patient with underlying history obstructive sleep apnea and most likely OHS. -Continue to follow low-sodium diet, adequate hydration and daily weights. -2 dosages of metolazone were given during hospitalization to assist with diuresis along with IV Lasix.   -Patient will complete management regarding the steroids tapering and resumption of home bronchodilator regimen for her asthma. -Lasix 40 mg twice a day has been instructed to continue assisting with volume management. -Patient was also started on losartan on daily basis. -Continue to wean oxygen supplementation as tolerated slowly. -Extensive discussion about weight loss and bariatric assessment as an outpatient provided. -Continue CPAP/BiPAP nightly.  2-hypertension: -Stable and well-controlled currently -Continue the use of losartan 25 mg daily along with Lasix for blood pressure control. -Advised to follow heart healthy/low-sodium diet.  3-morbid obesity -Body mass index is 59.2 kg/m. -Low calorie diet, portion control and increase physical activity discussed with patient. -Will benefit of outpatient evaluation and further management  at Bariatric clinic.  4-OSA treated with BiPAP -continue CPAP/BIPAP QHS -patient needs to resume home usage of this equipments at home.  5-GERD -continue PPI  6-depression -  continue zoloft  Procedures: See below for x-ray report 2D echo: 1. Left ventricular ejection fraction, by estimation, is 60 to 65%. The  left ventricle has normal function. The left ventricle has no regional  wall motion abnormalities. There is moderate concentric left ventricular  hypertrophy. Left ventricular  diastolic parameters are indeterminate.  2. Right ventricular systolic function is normal. The right ventricular  size is normal. There is moderately elevated pulmonary artery systolic  pressure. The estimated right ventricular systolic pressure is 40.8 mmHg.  3. The mitral valve is normal in structure. No evidence of mitral valve  regurgitation. No evidence of mitral stenosis.  4. The aortic valve is normal in structure. Aortic valve regurgitation is  not visualized. No aortic stenosis is present.  5. The inferior vena cava is dilated in size with <50% respiratory  variability, suggesting right atrial pressure of 15 mmHg.   Consultations:  None   Discharge Exam: Vitals:   11/04/20 0808 11/04/20 0900  BP:  127/73  Pulse:  (!) 56  Resp:  20  Temp:  97.6 F (36.4 C)  SpO2: 93% 96%   General exam:  No fever, no chest pain, no nausea, no vomiting.  Reports significant improvement in her breathing and feeling ready to go home.  2 L nasal cannula supplementation at rest and using 4 L nasal cannula supplementation with exertion. Respiratory system: No wheezing appreciated today; improved from movement bilaterally; some decreased breath sounds at the bases but no frank crackles appreciated.  No using accessory muscles and normal respiratory effort. Cardiovascular system:RRR. No murmurs, rubs, gallops.  Unable to properly assess JVD due to body habitus. Gastrointestinal system: Abdomen is  obese nondistended, soft and nontender. No organomegaly or masses felt. Normal bowel sounds heard. Central nervous system: Alert and oriented. No focal neurological deficits. Extremities: No cyanosis or clubbing; trace edema bilaterally Skin: No rashes, no petechiae. Psychiatry: Judgement and insight appear normal. Mood & affect appropriate.    Discharge Instructions   Discharge Instructions    (HEART FAILURE PATIENTS) Call MD:  Anytime you have any of the following symptoms: 1) 3 pound weight gain in 24 hours or 5 pounds in 1 week 2) shortness of breath, with or without a dry hacking cough 3) swelling in the hands, feet or stomach 4) if you have to sleep on extra pillows at night in order to breathe.   Complete by: As directed    Diet - low sodium heart healthy   Complete by: As directed    Discharge instructions   Complete by: As directed    Take medications as prescribed Follow low-sodium diet (less than 2.5 g daily) Check your weight on daily basis Maintain adequate hydration -Follow-up with PCP in 10 days Make sure to use oxygen supplementation as instructed and be compliant with your CPAP nightly.     Allergies as of 11/04/2020   No Known Allergies     Medication List    STOP taking these medications   amLODipine 10 MG tablet Commonly known as: NORVASC   lisinopril 2.5 MG tablet Commonly known as: ZESTRIL     TAKE these medications   albuterol 108 (90 Base) MCG/ACT inhaler Commonly known as: VENTOLIN HFA Inhale 2 puffs into the lungs every 6 (six) hours as needed for wheezing or shortness of breath.   furosemide 40 MG tablet Commonly known as: LASIX Take 1 tablet (40 mg total) by mouth 2 (two) times daily. May take additional 20 mg for swelling Daily What  changed:   medication strength  how much to take  when to take this   hydrocortisone 2.5 % rectal cream Commonly known as: ANUSOL-HC Place rectally 3 (three) times daily.   losartan 25 MG  tablet Commonly known as: COZAAR Take 1 tablet (25 mg total) by mouth daily.   omeprazole 40 MG capsule Commonly known as: PRILOSEC Take 1 capsule (40 mg total) by mouth daily. What changed:   medication strength  how much to take  when to take this  reasons to take this   predniSONE 20 MG tablet Commonly known as: DELTASONE Take 2 tablet by mouth x1 day; then 1 tablet daily x2 days; then half tablet by mouth daily x3 days and stop prednisone.   sertraline 100 MG tablet Commonly known as: ZOLOFT Take 1 tablet (100 mg total) by mouth daily.      No Known Allergies  Follow-up Information    April Manson, NP. Schedule an appointment as soon as possible for a visit in 10 day(s).   Specialty: Family Medicine Contact information: [phone] [place] [address] [phone]                The results of significant diagnostics from this hospitalization (including imaging, microbiology, ancillary and laboratory) are listed below for reference.    Significant Diagnostic Studies: CT Angio Chest PE W/Cm &/Or Wo Cm  Result Date: 10/31/2020 CLINICAL DATA:  Shortness of breath for the past week and a half. Negative COVID 19 test. EXAM: CT ANGIOGRAPHY CHEST WITH CONTRAST TECHNIQUE: Multidetector CT imaging of the chest was performed using the standard protocol during bolus administration of intravenous contrast. Multiplanar CT image reconstructions and MIPs were obtained to evaluate the vascular anatomy. CONTRAST:  OMNIPAQUE IOHEXOL 350 MG/ML SOLN COMPARISON:  Chest x-ray from same day. CT chest dated June 23, 2019. FINDINGS: Cardiovascular: Suboptimal evaluation of the segmental pulmonary arteries due to contrast bolus timing and respiratory motion artifact. No evidence of central or lobar pulmonary embolism. Unchanged main pulmonary artery dilatation measuring up to 3.9 cm. Unchanged borderline cardiomegaly. No pericardial effusion. Mediastinum/Nodes:  Prominent subcentimeter mediastinal and bilateral hilar lymph nodes are similar to slightly increased in size since the prior study, likely reactive. No enlarged axillary lymph nodes. The thyroid gland, trachea, and esophagus demonstrate no significant findings. Lungs/Pleura: Small bilateral pleural effusions. Bilateral perihilar ground-glass densities. No pneumothorax. Bilateral lower lobe subsegmental atelectasis. Upper Abdomen: No acute abnormality. Postsurgical changes of the proximal stomach. Musculoskeletal: No chest wall abnormality. No acute or significant osseous findings. Review of the MIP images confirms the above findings. IMPRESSION: 1. No evidence of central or lobar pulmonary embolism. Suboptimal evaluation of the segmental pulmonary arteries due to contrast bolus timing and respiratory motion artifact. 2. Moderate pulmonary edema and small bilateral pleural effusions, consistent with congestive heart failure. 3. Unchanged main pulmonary artery dilatation, suggestive of pulmonary arterial hypertension. Electronically Signed   By: Obie Dredge M.D.   On: 10/31/2020 12:21   DG Chest Portable 1 View  Result Date: 10/31/2020 CLINICAL DATA:  Shortness of breath for approximately 10 days. EXAM: PORTABLE CHEST 1 VIEW COMPARISON:  Single-view of the chest and CT chest 06/23/2019. PA and lateral chest 12/23/2017. FINDINGS: The exam is limited by the patient's size and portable technique. Lungs are grossly clear. Cardiomegaly. No pneumothorax or pleural effusion. IMPRESSION: Cardiomegaly without acute disease. Electronically Signed   By: Drusilla Kanner M.D.   On: 10/31/2020 10:25   ECHOCARDIOGRAM COMPLETE  Result Date: 11/01/2020    ECHOCARDIOGRAM REPORT   Patient Name:   Ann Giles Date of Exam: 11/01/2020 Medical Rec #:  [id number]           Height:       68.0 in Accession #:    [id number]          Weight:       389.1 lb Date of Birth:  Jul 30, 1971          BSA:          2.712 m Patient  Age:    48 years            BP:           136/77 mmHg Patient Gender: F                   HR:           83 bpm. [facility] Procedure: 2D Echo, Cardiac Doppler and Color Doppler Indications:    CHF  History:        Patient has prior history of Echocardiogram examinations, most                 recent 06/24/2019. CHF, Signs/Symptoms:Shortness of Breath and                 Dyspnea; Risk Factors:Sleep Apnea, Hypertension and Morbid                 obesity.  Sonographer:    Lavenia Atlas RDCS Referring Phys: [phone] Alla Sloma IMPRESSIONS  1. Left ventricular ejection fraction, by estimation, is 60 to 65%. The left ventricle has normal function. The left ventricle has no regional wall motion abnormalities. There is moderate concentric left ventricular hypertrophy. Left ventricular diastolic parameters are indeterminate.  2. Right ventricular systolic function is normal. The right ventricular size is normal. There is moderately elevated pulmonary artery systolic pressure. The estimated right ventricular systolic pressure is 40.8 mmHg.  3. The mitral valve is normal in structure. No evidence of mitral valve regurgitation. No evidence of mitral stenosis.  4. The aortic valve is normal in structure. Aortic valve regurgitation is not visualized. No aortic stenosis is present.  5. The inferior vena cava is dilated in size with <50% respiratory variability, suggesting right atrial pressure of 15 mmHg. FINDINGS  Left Ventricle: Left ventricular ejection fraction, by estimation, is 60 to 65%. The left ventricle has normal function. The left ventricle has no regional wall motion abnormalities. The left ventricular internal cavity size was normal in size. There is  moderate concentric left ventricular hypertrophy. Left ventricular diastolic parameters are indeterminate. Normal left ventricular filling pressure. Right Ventricle: The right ventricular size is normal. No increase in right ventricular wall thickness.  Right ventricular systolic function is normal. There is moderately elevated pulmonary artery systolic pressure. The tricuspid regurgitant velocity is 2.54 m/s, and with an assumed right atrial pressure of 15 mmHg, the estimated right ventricular systolic pressure is 40.8 mmHg. Left Atrium: Left atrial size was normal in size. Right Atrium: Right atrial size was normal in size. Pericardium: There is no evidence of pericardial effusion. Mitral Valve: The mitral valve is normal in structure. No evidence of mitral valve regurgitation. No evidence of mitral valve stenosis. Tricuspid Valve: The tricuspid valve is normal in structure. Tricuspid valve regurgitation is trivial. No evidence of tricuspid stenosis. Aortic Valve: The aortic valve is normal in structure. Aortic valve regurgitation is not visualized.  No aortic stenosis is present. Pulmonic Valve: The pulmonic valve was normal in structure. Pulmonic valve regurgitation is not visualized. No evidence of pulmonic stenosis. Aorta: The aortic root is normal in size and structure. Venous: The inferior vena cava is dilated in size with less than 50% respiratory variability, suggesting right atrial pressure of 15 mmHg. IAS/Shunts: No atrial level shunt detected by color flow Doppler.  LEFT VENTRICLE PLAX 2D LVIDd:         5.08 cm  Diastology LVIDs:         3.11 cm  LV e' medial:    8.05 cm/s LV PW:         1.37 cm  LV E/e' medial:  9.7 LV IVS:        1.33 cm  LV e' lateral:   6.09 cm/s LVOT diam:     2.30 cm  LV E/e' lateral: 12.9 LV SV:         122 LV SV Index:   45 LVOT Area:     4.15 cm  RIGHT VENTRICLE RV Basal diam:  3.74 cm RV S prime:     9.46 cm/s TAPSE (M-mode): 3.0 cm LEFT ATRIUM             Index       RIGHT ATRIUM           Index LA diam:        4.10 cm 1.51 cm/m  RA Area:     23.50 cm LA Vol (A2C):   55.9 ml 20.61 ml/m RA Volume:   85.60 ml  31.56 ml/m LA Vol (A4C):   52.3 ml 19.29 ml/m LA Biplane Vol: 59.5 ml 21.94 ml/m  AORTIC VALVE LVOT Vmax:    136.00 cm/s LVOT Vmean:  97.400 cm/s LVOT VTI:    0.293 m  AORTA Ao Root diam: 2.60 cm MITRAL VALVE               TRICUSPID VALVE MV Area (PHT): 3.76 cm    TR Peak grad:   25.8 mmHg MV Decel Time: 202 msec    TR Vmax:        254.00 cm/s MV E velocity: 78.40 cm/s MV A velocity: 67.30 cm/s  SHUNTS MV E/A ratio:  1.16        Systemic VTI:  0.29 m                            Systemic Diam: 2.30 cm Armanda Magic MD Electronically signed by Armanda Magic MD Signature Date/Time: 11/01/2020/1:23:04 PM    Final     Microbiology: Recent Results (from the past 240 hour(s))  Resp Panel by RT PCR (RSV, Flu A&B, Covid) - Nasopharyngeal Swab     Status: None   Collection Time: 10/31/20 10:17 AM   Specimen: Nasopharyngeal Swab  Result Value Ref Range Status   SARS Coronavirus 2 by RT PCR NEGATIVE NEGATIVE Final    Comment: (NOTE) SARS-CoV-2 target nucleic acids are NOT DETECTED.  The SARS-CoV-2 RNA is generally detectable in upper respiratoy specimens during the acute phase of infection. The lowest concentration of SARS-CoV-2 viral copies this assay can detect is 131 copies/mL. A negative result does not preclude SARS-Cov-2 infection and should not be used as the sole basis for treatment or other patient management decisions. A negative result may occur with  improper specimen collection/handling, submission of specimen other than nasopharyngeal swab, presence of viral mutation(s) within the  areas targeted by this assay, and inadequate number of viral copies (<131 copies/mL). A negative result must be combined with clinical observations, patient history, and epidemiological information. The expected result is Negative.  Fact Sheet for Patients:  https://www.moore.com/  Fact Sheet for Healthcare Providers:  https://www.young.biz/  This test is no t yet approved or cleared by the [place] FDA and  has been authorized for detection and/or diagnosis of SARS-CoV-2  by FDA under an Emergency Use Authorization (EUA). This EUA will remain  in effect (meaning this test can be used) for the duration of the COVID-19 declaration under Section 564(b)(1) of the Act, 21 U.S.C. section 360bbb-3(b)(1), unless the authorization is terminated or revoked sooner.     Influenza A by PCR NEGATIVE NEGATIVE Final   Influenza B by PCR NEGATIVE NEGATIVE Final    Comment: (NOTE) The Xpert Xpress SARS-CoV-2/FLU/RSV assay is intended as an aid in  the diagnosis of influenza from Nasopharyngeal swab specimens and  should not be used as a sole basis for treatment. Nasal washings and  aspirates are unacceptable for Xpert Xpress SARS-CoV-2/FLU/RSV  testing.  Fact Sheet for Patients: https://www.moore.com/  Fact Sheet for Healthcare Providers: https://www.young.biz/  This test is not yet approved or cleared by the [place] FDA and  has been authorized for detection and/or diagnosis of SARS-CoV-2 by  FDA under an Emergency Use Authorization (EUA). This EUA will remain  in effect (meaning this test can be used) for the duration of the  Covid-19 declaration under Section 564(b)(1) of the Act, 21  U.S.C. section 360bbb-3(b)(1), unless the authorization is  terminated or revoked.    Respiratory Syncytial Virus by PCR NEGATIVE NEGATIVE Final    Comment: (NOTE) Fact Sheet for Patients: https://www.moore.com/  Fact Sheet for Healthcare Providers: https://www.young.biz/  This test is not yet approved or cleared by the [place] FDA and  has been authorized for detection and/or diagnosis of SARS-CoV-2 by  FDA under an Emergency Use Authorization (EUA). This EUA will remain  in effect (meaning this test can be used) for the duration of the  COVID-19 declaration under Section 564(b)(1) of the Act, 21 U.S.C.  section 360bbb-3(b)(1), unless the authorization is terminated or   revoked. Performed at [facility], [address], [place], [place] [address]      Labs: Basic Metabolic Panel: Recent Labs  Lab 10/31/20 1024 10/31/20 1225 11/01/20 0705 11/02/20 0622 11/03/20 0706 11/04/20 0608  NA 142  --  141 140 139 140  K 3.6  --  3.9 3.5 3.3* 3.3*  CL 96*  --  93* 91* 88* 87*  CO2 35*  --  38* 41* 42* 42*  GLUCOSE 108*  --  113* 88 88 89  BUN 14  --  17 23* 24* 31*  CREATININE 0.97  --  0.94 0.93 0.96 1.03*  CALCIUM 8.7*  --  8.9 8.8* 9.3 9.5  MG  --  1.8  --   --   --   --    Liver Function Tests: Recent Labs  Lab 10/31/20 1024  AST 12*  ALT 14  ALKPHOS 62  BILITOT 1.6*  PROT 8.0  ALBUMIN 3.7   No results for input(s): LIPASE, AMYLASE in the last 168 hours. No results for input(s): AMMONIA in the last 168 hours. CBC: Recent Labs  Lab 10/31/20 1024  WBC 6.3  NEUTROABS 4.4  HGB 13.1  HCT 45.7  MCV 102.2*  PLT 258   Cardiac Enzymes: No results for input(s): CKTOTAL,  CKMB, CKMBINDEX, TROPONINI in the last 168 hours. BNP: BNP (last 3 results) Recent Labs    10/31/20 1024  BNP 27.0    ProBNP (last 3 results) No results for input(s): PROBNP in the last 8760 hours.  CBG: No results for input(s): GLUCAP in the last 168 hours.     Signed:  Vassie Lollarlos Nyleah Mcginnis MD.  [facility] 11/04/2020, 9:51 AM

## 2021-03-25 ENCOUNTER — Encounter: Payer: Self-pay | Admitting: Internal Medicine

## 2021-05-13 ENCOUNTER — Ambulatory Visit: Admitting: Internal Medicine

## 2021-07-09 ENCOUNTER — Other Ambulatory Visit: Payer: Self-pay

## 2021-07-09 ENCOUNTER — Encounter (HOSPITAL_COMMUNITY): Payer: Self-pay | Admitting: Emergency Medicine

## 2021-07-09 ENCOUNTER — Emergency Department (HOSPITAL_COMMUNITY)

## 2021-07-09 ENCOUNTER — Inpatient Hospital Stay (HOSPITAL_COMMUNITY)
Admission: EM | Admit: 2021-07-09 | Discharge: 2021-07-13 | DRG: 177 | Disposition: A | Discharge status: Home / Self Care | Attending: Family Medicine | Admitting: Family Medicine

## 2021-07-09 DIAGNOSIS — J1282 Pneumonia due to coronavirus disease 2019: Principal | ICD-10-CM | POA: Diagnosis present

## 2021-07-09 DIAGNOSIS — Z9981 Dependence on supplemental oxygen: Secondary | ICD-10-CM | POA: Diagnosis not present

## 2021-07-09 DIAGNOSIS — Z6841 Body Mass Index (BMI) 40.0 and over, adult: Secondary | ICD-10-CM

## 2021-07-09 DIAGNOSIS — G4733 Obstructive sleep apnea (adult) (pediatric): Secondary | ICD-10-CM | POA: Diagnosis present

## 2021-07-09 DIAGNOSIS — U071 COVID-19: Principal | ICD-10-CM | POA: Diagnosis present

## 2021-07-09 DIAGNOSIS — R0902 Hypoxemia: Secondary | ICD-10-CM

## 2021-07-09 DIAGNOSIS — J208 Acute bronchitis due to other specified organisms: Secondary | ICD-10-CM | POA: Diagnosis present

## 2021-07-09 DIAGNOSIS — I110 Hypertensive heart disease with heart failure: Secondary | ICD-10-CM | POA: Diagnosis present

## 2021-07-09 DIAGNOSIS — R062 Wheezing: Secondary | ICD-10-CM

## 2021-07-09 DIAGNOSIS — J9621 Acute and chronic respiratory failure with hypoxia: Secondary | ICD-10-CM | POA: Diagnosis present

## 2021-07-09 DIAGNOSIS — K219 Gastro-esophageal reflux disease without esophagitis: Secondary | ICD-10-CM | POA: Diagnosis present

## 2021-07-09 DIAGNOSIS — J45909 Unspecified asthma, uncomplicated: Secondary | ICD-10-CM | POA: Diagnosis present

## 2021-07-09 DIAGNOSIS — I10 Essential (primary) hypertension: Secondary | ICD-10-CM | POA: Diagnosis present

## 2021-07-09 DIAGNOSIS — J9601 Acute respiratory failure with hypoxia: Secondary | ICD-10-CM | POA: Diagnosis present

## 2021-07-09 DIAGNOSIS — I5033 Acute on chronic diastolic (congestive) heart failure: Secondary | ICD-10-CM | POA: Diagnosis present

## 2021-07-09 DIAGNOSIS — I5031 Acute diastolic (congestive) heart failure: Secondary | ICD-10-CM | POA: Diagnosis present

## 2021-07-09 DIAGNOSIS — F32A Depression, unspecified: Secondary | ICD-10-CM | POA: Diagnosis present

## 2021-07-09 LAB — BASIC METABOLIC PANEL
Anion gap: 10 (ref 5–15)
BUN: 10 mg/dL (ref 6–20)
CO2: 33 mmol/L — ABNORMAL HIGH (ref 22–32)
Calcium: 9 mg/dL (ref 8.9–10.3)
Chloride: 95 mmol/L — ABNORMAL LOW (ref 98–111)
Creatinine, Ser: 0.93 mg/dL (ref 0.44–1.00)
GFR, Estimated: >60 mL/min (ref >60)
Glucose, Bld: 90 mg/dL (ref 70–99)
Potassium: 3.8 mmol/L (ref 3.5–5.1)
Sodium: 138 mmol/L (ref 135–145)

## 2021-07-09 LAB — RESP PANEL BY RT-PCR (FLU A&B, COVID) ARPGX2
Influenza A by PCR: NEGATIVE
Influenza B by PCR: NEGATIVE
SARS Coronavirus 2 by RT PCR: POSITIVE — AB

## 2021-07-09 LAB — BRAIN NATRIURETIC PEPTIDE
B Natriuretic Peptide: 10 pg/mL (ref 0.0–100.0)
B Natriuretic Peptide: 11 pg/mL (ref 0.0–100.0)

## 2021-07-09 LAB — CBC WITH DIFFERENTIAL/PLATELET
Abs Immature Granulocytes: 0.06 10*3/uL (ref 0.00–0.07)
Basophils Absolute: 0 10*3/uL (ref 0.0–0.1)
Basophils Relative: 1 %
Eosinophils Absolute: 0.1 10*3/uL (ref 0.0–0.5)
Eosinophils Relative: 2 %
HCT: 41.7 % (ref 36.0–46.0)
Hemoglobin: 12.7 g/dL (ref 12.0–15.0)
Immature Granulocytes: 1 %
Lymphocytes Relative: 22 %
Lymphs Abs: 1.8 10*3/uL (ref 0.7–4.0)
MCH: 31.6 pg (ref 26.0–34.0)
MCHC: 30.5 g/dL (ref 30.0–36.0)
MCV: 103.7 fL — ABNORMAL HIGH (ref 80.0–100.0)
Monocytes Absolute: 0.8 10*3/uL (ref 0.1–1.0)
Monocytes Relative: 9 %
Neutro Abs: 5.5 10*3/uL (ref 1.7–7.7)
Neutrophils Relative %: 65 %
Platelets: 280 10*3/uL (ref 150–400)
RBC: 4.02 MIL/uL (ref 3.87–5.11)
RDW: 14.1 % (ref 11.5–15.5)
WBC: 8.4 10*3/uL (ref 4.0–10.5)
nRBC: 0 % (ref 0.0–0.2)

## 2021-07-09 LAB — PROCALCITONIN: Procalcitonin: <0.1 ng/mL

## 2021-07-09 LAB — FERRITIN: Ferritin: 62 ng/mL (ref 11–307)

## 2021-07-09 LAB — C-REACTIVE PROTEIN: CRP: 5.6 mg/dL — ABNORMAL HIGH (ref <1.0)

## 2021-07-09 LAB — D-DIMER, QUANTITATIVE: D-Dimer, Quant: 0.86 ug/mL-FEU — ABNORMAL HIGH (ref 0.00–0.50)

## 2021-07-09 MED ORDER — PREDNISONE 20 MG PO TABS
50.0000 mg | ORAL_TABLET | Freq: Every day | ORAL | Status: DC
  Administered 2021-07-12 – 2021-07-13 (×2): 50 mg via ORAL
  Filled 2021-07-09 (×2): qty 1

## 2021-07-09 MED ORDER — SODIUM CHLORIDE 0.9 % IV SOLN
175.0000 mg | Freq: Two times a day (BID) | INTRAVENOUS | Status: AC
  Administered 2021-07-10 – 2021-07-12 (×5): 180 mg via INTRAVENOUS
  Filled 2021-07-09 (×6): qty 1.44

## 2021-07-09 MED ORDER — FUROSEMIDE 40 MG PO TABS
40.0000 mg | ORAL_TABLET | Freq: Two times a day (BID) | ORAL | Status: DC
  Administered 2021-07-09: 40 mg via ORAL
  Filled 2021-07-09 (×2): qty 1

## 2021-07-09 MED ORDER — HYDROCOD POLST-CPM POLST ER 10-8 MG/5ML PO SUER
5.0000 mL | Freq: Two times a day (BID) | ORAL | Status: DC
  Administered 2021-07-09 – 2021-07-13 (×8): 5 mL via ORAL
  Filled 2021-07-09 (×9): qty 5

## 2021-07-09 MED ORDER — PAXLOVID 10 X 150 MG & 10 X 100MG PO TBPK: 2.0000 | ORAL_TABLET | Freq: Two times a day (BID) | ORAL | 0 refills | Status: DC

## 2021-07-09 MED ORDER — ALBUTEROL SULFATE HFA 108 (90 BASE) MCG/ACT IN AERS
2.0000 | INHALATION_SPRAY | Freq: Once | RESPIRATORY_TRACT | Status: AC
  Administered 2021-07-09: 2 via RESPIRATORY_TRACT
  Filled 2021-07-09: qty 6.7

## 2021-07-09 MED ORDER — SODIUM CHLORIDE 0.9 % IV SOLN
100.0000 mg | Freq: Every day | INTRAVENOUS | Status: AC
  Administered 2021-07-10 – 2021-07-13 (×4): 100 mg via INTRAVENOUS
  Filled 2021-07-09 (×4): qty 20

## 2021-07-09 MED ORDER — SODIUM CHLORIDE 0.9 % IV SOLN: 100.0000 mg | Freq: Every day | INTRAVENOUS | Status: DC

## 2021-07-09 MED ORDER — SODIUM CHLORIDE 0.9 % IV SOLN: 200.0000 mg | Freq: Once | INTRAVENOUS | Status: DC

## 2021-07-09 MED ORDER — LEVOFLOXACIN 500 MG PO TABS
500.0000 mg | ORAL_TABLET | Freq: Every day | ORAL | Status: DC
  Administered 2021-07-09 – 2021-07-13 (×5): 500 mg via ORAL
  Filled 2021-07-09 (×5): qty 1

## 2021-07-09 MED ORDER — IPRATROPIUM-ALBUTEROL 20-100 MCG/ACT IN AERS
1.0000 | INHALATION_SPRAY | Freq: Four times a day (QID) | RESPIRATORY_TRACT | Status: DC
  Administered 2021-07-09: 1 via RESPIRATORY_TRACT
  Filled 2021-07-09: qty 4

## 2021-07-09 MED ORDER — IPRATROPIUM-ALBUTEROL 20-100 MCG/ACT IN AERS: 1.0000 | INHALATION_SPRAY | Freq: Three times a day (TID) | RESPIRATORY_TRACT | Status: DC

## 2021-07-09 MED ORDER — SODIUM CHLORIDE 0.9 % IV SOLN
100.0000 mg | INTRAVENOUS | Status: AC
  Administered 2021-07-09 (×2): 100 mg via INTRAVENOUS
  Filled 2021-07-09 (×2): qty 20

## 2021-07-09 MED ORDER — PANTOPRAZOLE SODIUM 40 MG PO TBEC
40.0000 mg | DELAYED_RELEASE_TABLET | Freq: Every day | ORAL | Status: DC
  Administered 2021-07-10 – 2021-07-13 (×4): 40 mg via ORAL
  Filled 2021-07-09 (×4): qty 1

## 2021-07-09 MED ORDER — ASCORBIC ACID 500 MG PO TABS
500.0000 mg | ORAL_TABLET | Freq: Every day | ORAL | Status: DC
  Administered 2021-07-10 – 2021-07-13 (×4): 500 mg via ORAL
  Filled 2021-07-09 (×4): qty 1

## 2021-07-09 MED ORDER — GUAIFENESIN-DM 100-10 MG/5ML PO SYRP
10.0000 mL | ORAL_SOLUTION | Freq: Three times a day (TID) | ORAL | Status: DC
  Administered 2021-07-09 – 2021-07-13 (×11): 10 mL via ORAL
  Filled 2021-07-09 (×11): qty 10

## 2021-07-09 MED ORDER — POLYETHYLENE GLYCOL 3350 17 G PO PACK: 17.0000 g | PACK | Freq: Every day | ORAL | Status: DC | PRN

## 2021-07-09 MED ORDER — ZINC SULFATE 220 (50 ZN) MG PO CAPS
220.0000 mg | ORAL_CAPSULE | Freq: Every day | ORAL | Status: DC
  Administered 2021-07-10 – 2021-07-13 (×4): 220 mg via ORAL
  Filled 2021-07-09 (×4): qty 1

## 2021-07-09 MED ORDER — HYDRALAZINE HCL 20 MG/ML IJ SOLN: 10.0000 mg | INTRAMUSCULAR | Status: DC | PRN

## 2021-07-09 MED ORDER — HEPARIN SODIUM (PORCINE) 5000 UNIT/ML IJ SOLN
5000.0000 [IU] | Freq: Three times a day (TID) | INTRAMUSCULAR | Status: DC
  Administered 2021-07-09 – 2021-07-13 (×12): 5000 [IU] via SUBCUTANEOUS
  Filled 2021-07-09 (×11): qty 1

## 2021-07-09 MED ORDER — ALBUTEROL SULFATE HFA 108 (90 BASE) MCG/ACT IN AERS
4.0000 | INHALATION_SPRAY | RESPIRATORY_TRACT | Status: AC
  Administered 2021-07-09 (×2): 4 via RESPIRATORY_TRACT

## 2021-07-09 MED ORDER — DOCUSATE SODIUM 100 MG PO CAPS
100.0000 mg | ORAL_CAPSULE | Freq: Two times a day (BID) | ORAL | Status: DC
  Administered 2021-07-09 – 2021-07-13 (×8): 100 mg via ORAL
  Filled 2021-07-09 (×9): qty 1

## 2021-07-09 MED ORDER — ONDANSETRON HCL 4 MG/2ML IJ SOLN: 4.0000 mg | Freq: Four times a day (QID) | INTRAMUSCULAR | Status: DC | PRN

## 2021-07-09 MED ORDER — SERTRALINE HCL 50 MG PO TABS
100.0000 mg | ORAL_TABLET | Freq: Every day | ORAL | Status: DC
  Administered 2021-07-09 – 2021-07-13 (×5): 100 mg via ORAL
  Filled 2021-07-09 (×5): qty 2

## 2021-07-09 MED ORDER — AMLODIPINE BESYLATE 5 MG PO TABS
10.0000 mg | ORAL_TABLET | Freq: Every day | ORAL | Status: DC
  Filled 2021-07-09: qty 2

## 2021-07-09 MED ORDER — OXYCODONE HCL 5 MG PO TABS: 5.0000 mg | ORAL_TABLET | ORAL | Status: DC | PRN

## 2021-07-09 MED ORDER — GUAIFENESIN-DM 100-10 MG/5ML PO SYRP
10.0000 mL | ORAL_SOLUTION | Freq: Once | ORAL | Status: AC
  Administered 2021-07-09: 10 mL via ORAL
  Filled 2021-07-09: qty 10

## 2021-07-09 MED ORDER — LOSARTAN POTASSIUM 50 MG PO TABS
25.0000 mg | ORAL_TABLET | Freq: Every day | ORAL | Status: DC
  Administered 2021-07-09 – 2021-07-13 (×5): 25 mg via ORAL
  Filled 2021-07-09 (×5): qty 1

## 2021-07-09 MED ORDER — FLEET ENEMA 7-19 GM/118ML RE ENEM: 1.0000 | ENEMA | Freq: Once | RECTAL | Status: DC | PRN

## 2021-07-09 MED ORDER — ACETAMINOPHEN 325 MG PO TABS: 650.0000 mg | ORAL_TABLET | Freq: Four times a day (QID) | ORAL | Status: DC | PRN

## 2021-07-09 MED ORDER — ONDANSETRON HCL 4 MG PO TABS: 4.0000 mg | ORAL_TABLET | Freq: Four times a day (QID) | ORAL | Status: DC | PRN

## 2021-07-09 MED ORDER — DEXAMETHASONE SODIUM PHOSPHATE 10 MG/ML IJ SOLN
10.0000 mg | Freq: Once | INTRAMUSCULAR | Status: AC
  Administered 2021-07-09: 10 mg via INTRAVENOUS
  Filled 2021-07-09: qty 1

## 2021-07-09 NOTE — ED Provider Notes (Addendum)
[facility] EMERGENCY DEPARTMENT Provider Note   CSN: [id number] Arrival date & time: 07/09/21  0749     History Chief Complaint  Patient presents with   Cough    Ann Giles is a 50 y.o. female.  Patient with history of heart failure, asthma, obesity, sleep apnea, high blood pressure presents with body aches, fever, cough, congestion and feeling generally unwell for the past week.  Patient has been around other people with similar symptoms.  No chest pain or shortness of breath.  Mild discomfort with coughing.      Past Medical History:  Diagnosis Date   Arthritis    Asthma    Bronchitis    CHF (congestive heart failure) (HCC)    Depression    Hypertension    OSA (obstructive sleep apnea)     Patient Active Problem List   Diagnosis Date Noted   Hypoxia    Obesity hypoventilation syndrome (HCC)    Gastroesophageal reflux disease    OSA treated with BiPAP 10/31/2020   Community acquired pneumonia    Acute on chronic diastolic CHF (congestive heart failure) (HCC) 06/23/2019   Acute diastolic CHF (congestive heart failure) (HCC) 12/23/2017   Obesity, Class III, BMI 40-49.9 (morbid obesity) (HCC) 12/23/2017   Acute bronchospasm 09/09/2016   Asthma 09/09/2016   Hypertension 09/09/2016   Depression 09/09/2016   Leg swelling 09/09/2016   Acute respiratory failure with hypoxia (HCC) 09/09/2016   Bronchospasm, acute 09/09/2016    Past Surgical History:  Procedure Laterality Date   ABDOMINAL HYSTERECTOMY     CHOLECYSTECTOMY     GASTROPLASTY       OB History   No obstetric history on file.     Family History  Problem Relation Age of Onset   Asthma Neg Hx     Social History   Tobacco Use   Smoking status: Never   Smokeless tobacco: Never  Substance Use Topics   Alcohol use: No   Drug use: No    Home Medications Prior to Admission medications   Medication Sig Start Date End Date Taking? Authorizing Provider  albuterol (PROVENTIL HFA;VENTOLIN  HFA) 108 (90 Base) MCG/ACT inhaler Inhale 2 puffs into the lungs every 6 (six) hours as needed for wheezing or shortness of breath.    [provider]  furosemide (LASIX) 40 MG tablet Take 1 tablet (40 mg total) by mouth 2 (two) times daily. May take additional 20 mg for swelling Daily 11/04/20 02/02/21  Vassie Loll, MD  hydrocortisone (ANUSOL-HC) 2.5 % rectal cream Place rectally 3 (three) times daily. 11/04/20   Vassie Loll, MD  losartan (COZAAR) 25 MG tablet Take 1 tablet (25 mg total) by mouth daily. 11/04/20   Vassie Loll, MD  omeprazole (PRILOSEC) 40 MG capsule Take 1 capsule (40 mg total) by mouth daily. 11/04/20   Vassie Loll, MD  predniSONE (DELTASONE) 20 MG tablet Take 2 tablet by mouth x1 day; then 1 tablet daily x2 days; then half tablet by mouth daily x3 days and stop prednisone. 11/04/20   Vassie Loll, MD  sertraline (ZOLOFT) 100 MG tablet Take 1 tablet (100 mg total) by mouth daily. 06/26/19   Catarina Hartshorn, MD    Allergies    Patient has no known allergies.  Review of Systems   Review of Systems  Physical Exam Updated Vital Signs BP (!) 160/81 (BP Location: Right Wrist)   Pulse 74   Temp 97.6 F (36.4 C) (Oral)   Resp (!) 22   Ht  5\' 8"  (1.727 m)   Wt (!) 173.3 kg   SpO2 99%   BMI 58.08 kg/m   Physical Exam Vitals and nursing note reviewed.  Constitutional:      General: She is not in acute distress.    Appearance: She is well-developed.  HENT:     Head: Normocephalic and atraumatic.     Nose: Congestion and rhinorrhea present.     Mouth/Throat:     Mouth: Mucous membranes are moist.  Eyes:     General:        Right eye: No discharge.        Left eye: No discharge.     Conjunctiva/sclera: Conjunctivae normal.  Neck:     Trachea: No tracheal deviation.  Cardiovascular:     Rate and Rhythm: Normal rate and regular rhythm.     Heart sounds: No murmur heard. Pulmonary:     Breath sounds: Wheezing (minimal bases) and rhonchi present.   Abdominal:     General: There is no distension.     Palpations: Abdomen is soft.     Tenderness: There is no abdominal tenderness. There is no guarding.  Musculoskeletal:        General: Swelling present. Normal range of motion.     Cervical back: Normal range of motion and neck supple. No rigidity.  Skin:    General: Skin is warm.     Capillary Refill: Capillary refill takes less than 2 seconds.     Findings: No rash.  Neurological:     General: No focal deficit present.     Mental Status: She is alert.     Cranial Nerves: No cranial nerve deficit.  Psychiatric:        Mood and Affect: Mood normal.    ED Results / Procedures / Treatments   Labs (all labs ordered are listed, but only abnormal results are displayed) Labs Reviewed  RESP PANEL BY RT-PCR (FLU A&B, COVID) ARPGX2 - Abnormal; Notable for the following components:      Result Value   SARS Coronavirus 2 by RT PCR POSITIVE (*)    All other components within normal limits  BASIC METABOLIC PANEL - Abnormal; Notable for the following components:   Chloride 95 (*)    CO2 33 (*)    All other components within normal limits  CBC WITH DIFFERENTIAL/PLATELET - Abnormal; Notable for the following components:   MCV 103.7 (*)    All other components within normal limits  BRAIN NATRIURETIC PEPTIDE    EKG EKG Interpretation  Date/Time:  Thursday July 09 2021 08:13:42 EDT Ventricular Rate:  81 PR Interval:  160 QRS Duration: 93 QT Interval:  399 QTC Calculation: 464 R Axis:   76 Text Interpretation: Sinus rhythm Borderline repolarization abnormality Confirmed by [id number] [phone]) on 07/09/2021 10:22:06 AM  Radiology DG Chest Portable 1 View  Result Date: 07/09/2021 CLINICAL DATA:  Cough that is productive EXAM: PORTABLE CHEST 1 VIEW COMPARISON:  10/31/2020 FINDINGS: Low volume chest with interstitial crowding. There is no edema, consolidation, effusion, or pneumothorax. Chronic cardiomegaly. IMPRESSION: Stable low  volume chest. Electronically Signed   By: 13/11/2020 M.D.   On: 07/09/2021 09:00    Procedures Procedures   Medications Ordered in ED Medications  albuterol (VENTOLIN HFA) 108 (90 Base) MCG/ACT inhaler 2 puff (2 puffs Inhalation Given 07/09/21 0904)  dexamethasone (DECADRON) injection 10 mg (10 mg Intravenous Given 07/09/21 1140)  guaiFENesin-dextromethorphan (ROBITUSSIN DM) 100-10 MG/5ML syrup 10 mL (10 mLs Oral Given  07/09/21 1143)  albuterol (VENTOLIN HFA) 108 (90 Base) MCG/ACT inhaler 4 puff (4 puffs Inhalation Given 07/09/21 1246)    ED Course  I have reviewed the triage vital signs and the nursing notes.  Pertinent labs & imaging results that were available during my care of the patient were reviewed by me and considered in my medical decision making (see chart for details).    MDM Rules/Calculators/A&P                           Patient presents with clinical concern for viral pneumonia, COVID, infectious respiratory illness.  Patient has normal oxygenation, normal work of breathing.  Patient does have high risk factors given medical history.  Given prolonged symptoms I do not feel the oral medications will have significant benefit on her duration of therapy/symptoms.  Patient will need supportive care and outpatient follow-up.  Reviewed medical record last kidney function was normal GFR.  EKG was reviewed no acute ischemic findings.  Albuterol inhaler given in the ER.  Patient requiring 3 L nasal cannula, patient on home oxygen however with work of breathing increased, wheezing plan for observation/admission to hospital.  Albuterol ordered as needed.  Steroids given.  Chest x-ray reviewed no acute abnormalities.  Discussed with hospitalist for admission and updated patient on plan of care. Hospitalist requested remdesivir. Blood work reviewed showing normal white blood cell count, normal hemoglobin.  Emergency room extremely busy during patient presentation.  Delayed  reassessment, patient feeling short of breath and very congested.  Blood work added, steroids for wheezing and lung disease history.  Up-to-date on COVID presentation/diagnosis.  Plan to investigate for signs of pulmonary edema or other abnormalities with supportive care.  Cough expectorant ordered.  Repeat albuterol.   Final Clinical Impression(s) / ED Diagnoses Final diagnoses:  Pneumonia due to COVID-19 virus  Hypoxia  Wheezing    Rx / DC Orders ED Discharge Orders          Ordered    nirmatrelvir/ritonavir EUA, renal dosing, (PAXLOVID) TBPK  2 times daily,   Status:  Discontinued        07/09/21 1026             Blane Ohara, MD 07/09/21 1030    Blane Ohara, MD 07/09/21 1419

## 2021-07-09 NOTE — ED Notes (Signed)
ED Provider at bedside. 

## 2021-07-09 NOTE — Discharge Instructions (Addendum)
Use Tylenol every 4 hours as needed for pain and fever.   Return for worsening shortness of breath or new concerns.

## 2021-07-09 NOTE — H&P (Addendum)
History and Physical   Patient: Ann Giles                            PCP: April Manson, NP                    DOB: [birth year]            DOA: 07/09/2021 DGU:[id number]             DOS: 07/09/2021, 2:30 PM  Patient coming from:   HOME  I have personally reviewed patient's medical records, in electronic medical records, including:  [facility], and care everywhere.    Chief Complaint:   Chief Complaint  Patient presents with   Cough    History of present illness:    Ann Giles is a 50 y.o. female with medical history significant of morbid obesity, CHF, asthma, depression, hypertension, OSA, GERD, Chronic respiratory failure on 2 L of oxygen at home .. presented to ED in hypertensive state, complaining of body ache, fever, productive cough with green sputum congestion, for past week.    Patient Denies having: Fever, Chills, Chest Pain, Abd pain, N/V/D, headache, dizziness, lightheadedness,  Dysuria, Joint pain, rash, open wounds  ED Course:  Vitals: Temp 97.7, pulse 70, RR 22, BP 170/93, weight 273.3 Kg .. atting 90% on 4 L of oxygen Abnormal labs; CBC CMP BNP within normal limits SARS-CoV-2 positive Chest x-ray -stable lobe on chest IV stable and remdesivir initiated     Review of Systems: As per HPI, otherwise 10 point review of systems were negative.   ----------------------------------------------------------------------------------------------------------------------  No Known Allergies  Home MEDs:  Prior to Admission medications   Medication Sig Start Date End Date Taking? Authorizing Provider  albuterol (PROVENTIL HFA;VENTOLIN HFA) 108 (90 Base) MCG/ACT inhaler Inhale 2 puffs into the lungs every 6 (six) hours as needed for wheezing or shortness of breath.    [provider]  furosemide (LASIX) 40 MG tablet Take 1 tablet (40 mg total) by mouth 2 (two) times daily. May take additional 20 mg for swelling Daily 11/04/20 02/02/21   Vassie Loll, MD  hydrocortisone (ANUSOL-HC) 2.5 % rectal cream Place rectally 3 (three) times daily. 11/04/20   Vassie Loll, MD  losartan (COZAAR) 25 MG tablet Take 1 tablet (25 mg total) by mouth daily. 11/04/20   Vassie Loll, MD  omeprazole (PRILOSEC) 40 MG capsule Take 1 capsule (40 mg total) by mouth daily. 11/04/20   Vassie Loll, MD  predniSONE (DELTASONE) 20 MG tablet Take 2 tablet by mouth x1 day; then 1 tablet daily x2 days; then half tablet by mouth daily x3 days and stop prednisone. 11/04/20   Vassie Loll, MD  sertraline (ZOLOFT) 100 MG tablet Take 1 tablet (100 mg total) by mouth daily. 06/26/19   Catarina Hartshorn, MD    PRN MEDs: acetaminophen, ondansetron **OR** ondansetron (ZOFRAN) IV, oxyCODONE, polyethylene glycol, sodium phosphate  Past Medical History:  Diagnosis Date   Arthritis    Asthma    Bronchitis    CHF (congestive heart failure) (HCC)    Depression    Hypertension    OSA (obstructive sleep apnea)     Past Surgical History:  Procedure Laterality Date   ABDOMINAL HYSTERECTOMY     CHOLECYSTECTOMY     GASTROPLASTY       reports that she has never smoked. She has never used smokeless tobacco. She reports that she does not  drink alcohol and does not use drugs.   Family History  Problem Relation Age of Onset   Asthma Neg Hx     Physical Exam:   Vitals:   07/09/21 1215 07/09/21 1230 07/09/21 1245 07/09/21 1418  BP:  (!) 157/90 (!) 160/81 (!) 172/93  Pulse: 69 70 74 70  Resp:  20 (!) 22 (!) 22  Temp:   97.6 F (36.4 C)   TempSrc:   Oral   SpO2: 98% 99% 99% 98%  Weight:      Height:       Constitutional: NAD, calm, comfortable Eyes: PERRL, lids and conjunctivae normal ENMT: Mucous membranes are moist. Posterior pharynx clear of any exudate or lesions.Normal dentition.  Neck: normal, supple, no masses, no thyromegaly Respiratory: Diffusely wheezing, or rhonchi ... Mild to moderate distress with monthly breathing , on 4 L supplemental oxygen   no crackles. No accessory muscle use.  Cardiovascular: Regular rate and rhythm, no murmurs / rubs / gallops. No extremity edema. 2+ pedal pulses. No carotid bruits.  Abdomen: no tenderness, no masses palpated. No hepatosplenomegaly. Bowel sounds positive.  Musculoskeletal: no clubbing / cyanosis. No joint deformity upper and lower extremities. Good ROM, no contractures. Normal muscle tone.  Neurologic: CN II-XII grossly intact. Sensation intact, DTR normal. Strength 5/5 in all 4.  Psychiatric: Normal judgment and insight. Alert and oriented x 3. Normal mood.  Skin: no rashes, lesions, ulcers. No induration Wounds: per nursing documentation         Labs on admission:    I have personally reviewed following labs and imaging studies  CBC: Recent Labs  Lab 07/09/21 1154  WBC 8.4  NEUTROABS 5.5  HGB 12.7  HCT 41.7  MCV 103.7*  PLT 280   Basic Metabolic Panel: Recent Labs  Lab 07/09/21 1154  NA 138  K 3.8  CL 95*  CO2 33*  GLUCOSE 90  BUN 10  CREATININE 0.93  CALCIUM 9.0   Urine analysis:    Component Value Date/Time   COLORURINE YELLOW 03/12/2018 1750   APPEARANCEUR CLEAR 03/12/2018 1750   LABSPEC 1.020 03/12/2018 1750   PHURINE 5.0 03/12/2018 1750   GLUCOSEU NEGATIVE 03/12/2018 1750   HGBUR SMALL (A) 03/12/2018 1750   BILIRUBINUR NEGATIVE 03/12/2018 1750   KETONESUR NEGATIVE 03/12/2018 1750   PROTEINUR NEGATIVE 03/12/2018 1750   NITRITE NEGATIVE 03/12/2018 1750   LEUKOCYTESUR NEGATIVE 03/12/2018 1750     Radiologic Exams on Admission:   DG Chest Portable 1 View  Result Date: 07/09/2021 CLINICAL DATA:  Cough that is productive EXAM: PORTABLE CHEST 1 VIEW COMPARISON:  10/31/2020 FINDINGS: Low volume chest with interstitial crowding. There is no edema, consolidation, effusion, or pneumothorax. Chronic cardiomegaly. IMPRESSION: Stable low volume chest. Electronically Signed   By: Marnee Spring M.D.   On: 07/09/2021 09:00    EKG:   Independently  reviewed.  Orders placed or performed during the hospital encounter of 07/09/21   EKG 12-Lead   EKG 12-Lead   ---------------------------------------------------------------------------------------------------------------------------------------    Assessment / Plan:   Principal Problem:   COVID-19 virus infection Active Problems:   Acute respiratory failure with hypoxia (HCC)   Asthma   Hypertension   Depression   Acute diastolic CHF (congestive heart failure) (HCC)   Obesity, Class III, BMI 40-49.9 (morbid obesity) (HCC)   OSA treated with BiPAP   Gastroesophageal reflux disease   Principal Problem:  COVID-19 virus infection -Patient be admitted for close observation, isolation -Initiated IV steroids, and remdesivir -We will follow-up  with inflammatory markers -Currently on 4 L of oxygen, satting 98% -Initiated bronchodilator inhaler, mucolytic's, as needed,   Acute on chronic respiratory failure with hypoxia (HCC)  -Baseline O2 demand 2 L at home -Likely exacerbated by SARS-CoV-2 infection, with underlying asthma -was satting 87% on arrival on 2 L, currently on 4 L satting 98%  Will continue to monitor closely, continue bronchodilator inhaler  Acute bronchitis with sputum production -Initiating empiric p.o. antibiotics -Continue remdesivir  Active Problems:  Asthma -on steroids-high-dose steroids initiated, along with bronchodilator inhalers    Hypertension -holding home medication of losartan, adding Norvasc, as needed hydralazine, cont. Lasix    Depression -stable resume home medication of Zoloft   H/o Chronic diastolic CHF (congestive heart failure) (HCC)  -resume Lasix, monitoring daily weight, I's and O    Obesity, Class III,  - with Body mass index is 58.08 kg/m. counseled regarding weight loss, continue diet and exercise  History of OSA treated with BiPAP -resume after nightly  Gastroesophageal reflux disease -continue Protonix     Cultures:   -none  Antimicrobial: -none   Consults called:  None -------------------------------------------------------------------------------------------------------------------------------------------- DVT prophylaxis: SCD/Compression stockings and Heparin SQ Code Status:   Code Status: Full Code   Admission status: Patient will be admitted as Inpatient, with a greater than 2 midnight length of stay. Level of care: Med-Surg   Family Communication:  none at bedside  (The above findings and plan of care has been discussed with patient in detail, the patient expressed understanding and agreement of above plan)  --------------------------------------------------------------------------------------------------------------------------------------------------  Disposition Plan: >3 days Status is: Inpatient  Remains inpatient appropriate because:Inpatient level of care appropriate due to severity of illness  Dispo: The patient is from: Home              Anticipated d/c is to: Home              Patient currently is not medically stable to d/c.   Difficult to place patient No   ----------------------------------------------------------------------------------------------------------------------------------------------------  Time spent: > than  55  Min.   SIGNED: Kendell Bane, MD, FHM. [facility],  Pager (Please use [email or web address] to page to text)  If 7PM-7AM, please contact night-coverage [email or web address],  07/09/2021, 2:30 PM

## 2021-07-09 NOTE — ED Notes (Signed)
Pt in bed, pt states that she is breathing a little  Better, denies pain.

## 2021-07-09 NOTE — ED Notes (Signed)
Date and time results received: 07/09/21 1020   Test: COVID Critical Value: POSITIVE  Name of Provider Notified: EDP  Orders Received? Or Actions Taken?: Notified

## 2021-07-09 NOTE — ED Notes (Signed)
Pt in bed, pt denies pain, pt has productive cough, pt satting 87% after cough with exertion of cough, pt placed on 4L O2 via [place], pt now satting 99%

## 2021-07-09 NOTE — ED Triage Notes (Signed)
Pt c/o of a cough with productive green sputum x10 days. Pt also states she is tired and achy.

## 2021-07-10 DIAGNOSIS — U071 COVID-19: Secondary | ICD-10-CM | POA: Diagnosis not present

## 2021-07-10 LAB — COMPREHENSIVE METABOLIC PANEL
ALT: 17 U/L (ref 0–44)
AST: 15 U/L (ref 15–41)
Albumin: 3.5 g/dL (ref 3.5–5.0)
Alkaline Phosphatase: 67 U/L (ref 38–126)
Anion gap: 9 (ref 5–15)
BUN: 15 mg/dL (ref 6–20)
CO2: 31 mmol/L (ref 22–32)
Calcium: 8.8 mg/dL — ABNORMAL LOW (ref 8.9–10.3)
Chloride: 98 mmol/L (ref 98–111)
Creatinine, Ser: 0.83 mg/dL (ref 0.44–1.00)
GFR, Estimated: >60 mL/min (ref >60)
Glucose, Bld: 130 mg/dL — ABNORMAL HIGH (ref 70–99)
Potassium: 4.6 mmol/L (ref 3.5–5.1)
Sodium: 138 mmol/L (ref 135–145)
Total Bilirubin: 0.7 mg/dL (ref 0.3–1.2)
Total Protein: 8.2 g/dL — ABNORMAL HIGH (ref 6.5–8.1)

## 2021-07-10 LAB — FERRITIN: Ferritin: 58 ng/mL (ref 11–307)

## 2021-07-10 LAB — D-DIMER, QUANTITATIVE: D-Dimer, Quant: 0.8 ug/mL-FEU — ABNORMAL HIGH (ref 0.00–0.50)

## 2021-07-10 LAB — C-REACTIVE PROTEIN: CRP: 5.5 mg/dL — ABNORMAL HIGH (ref <1.0)

## 2021-07-10 LAB — MAGNESIUM: Magnesium: 2 mg/dL (ref 1.7–2.4)

## 2021-07-10 LAB — LACTATE DEHYDROGENASE: LDH: 154 U/L (ref 98–192)

## 2021-07-10 MED ORDER — HYDRALAZINE HCL 25 MG PO TABS
50.0000 mg | ORAL_TABLET | Freq: Three times a day (TID) | ORAL | Status: DC
  Administered 2021-07-10 – 2021-07-13 (×9): 50 mg via ORAL
  Filled 2021-07-10 (×8): qty 2

## 2021-07-10 MED ORDER — IPRATROPIUM-ALBUTEROL 20-100 MCG/ACT IN AERS: 1.0000 | INHALATION_SPRAY | Freq: Four times a day (QID) | RESPIRATORY_TRACT | Status: DC | PRN

## 2021-07-10 MED ORDER — FUROSEMIDE 10 MG/ML IJ SOLN
40.0000 mg | Freq: Once | INTRAMUSCULAR | Status: AC
  Administered 2021-07-10: 40 mg via INTRAVENOUS
  Filled 2021-07-10: qty 4

## 2021-07-10 MED ORDER — FUROSEMIDE 40 MG PO TABS
40.0000 mg | ORAL_TABLET | Freq: Two times a day (BID) | ORAL | Status: DC
  Filled 2021-07-10: qty 1

## 2021-07-10 NOTE — Progress Notes (Signed)
PROGRESS NOTE    Patient: Ann Giles                            PCP: April Manson, NP                    DOB: 05-Jun-1971            DOA: 07/09/2021 VHQ:[id number]             DOS: 07/10/2021, 11:38 AM   LOS: 1 day   Date of Service: The patient was seen and examined on 07/10/2021  Subjective:   The patient was seen and examined this morning. Stable at this time. Still complaining of : Cough, shortness of breath with exertion Otherwise no issues overnight .  Brief Narrative:   Ann Giles is a 50 y.o. female with medical history significant of morbid obesity, CHF, asthma, depression, hypertension, OSA, GERD, Chronic respiratory failure on 2 L of oxygen at home .. presented to ED in hypertensive state, complaining of body ache, fever, productive cough with green sputum congestion, for past week.  ED Course: Vitals: Temp 97.7, pulse 70, RR 22, BP 170/93, weight 273.3 Kg .. atting 90% on 4 L of oxygen Abnormal labs; CBC CMP BNP within normal limits SARS-CoV-2 positive Chest x-ray -stable lobe on chest IV stable and remdesivir initiated  Assessment & Plan:   Principal Problem:   COVID-19 virus infection Active Problems:   Acute respiratory failure with hypoxia (HCC)   Asthma   Hypertension   Depression   Acute diastolic CHF (congestive heart failure) (HCC)   Obesity, Class III, BMI 40-49.9 (morbid obesity) (HCC)   OSA treated with BiPAP   Gastroesophageal reflux disease   COVID-19 virus infection -Remained stable, still complaining of cough, still requiring 3-4 L of oxygen -Worsening shortness of breath and cough with exertion -Currently satting 92% on 3 L of oxygen -We will continue with IV steroids, and remdesivir -Monitoring inflammatory markers -We will continue with bronchodilator inhaler, mucolytic's, as needed, -For cough we will utilize Tussionex, Robitussin    Acute on chronic respiratory failure with hypoxia (HCC)  -Baseline O2 demand 2 L  at home, currently on 3 L of oxygen, satting 92% -O2 demand has improved from 4 L to 3 L now -Likely exacerbated by SARS-CoV-2 infection, with underlying asthma -On admission was satting 87%   on 2 L,  - Will continue to monitor closely, continue bronchodilator inhaler   Acute bronchitis with sputum production  -in the setting of SARS-CoV-2 infection, asthma -Initiating empiric p.o. antibiotics -Continue remdesivir, IV steroids, bronchodilators    Active Problems:   Asthma -on steroids-high-dose steroids initiated, along with bronchodilator inhalers, stable    Hypertension -resuming home medication of losartan, adding hydralazine, as needed IV hydralazine, cont. Lasix     Depression -stable, resume home medication of Zoloft    H/o Chronic diastolic CHF (congestive heart failure) (HCC)  -resume Lasix, monitoring daily weight, I's and O     Obesity, Class III,  - with Body mass index is 58.08 kg/m. counseled regarding weight loss, continue diet and exercise   History of OSA treated with BiPAP -resume after nightly   Gastroesophageal reflux disease -continue Protonix         Cultures: -none  Antimicrobial: -none    Consults called:  None -------------------------------------------------------------------------------------------------------------------------------------------- DVT prophylaxis: SCD/Compression stockings and Heparin SQ Code Status:   Code Status: Full Code  Admission status: Patient will be admitted as Inpatient, with a greater than 2 midnight length of stay. Level of care: Med-Surg     Family Communication:  none at bedside (The above findings and plan of care has been discussed with patient in detail, the patient expressed understanding and agreement of above plan) --------------------------------------------------------------------------------------------------------------------------------------------------   Disposition Plan: >3 days Status is:  Inpatient   Remains inpatient appropriate because:Inpatient level of care appropriate due to severity of illness   Dispo: The patient is from: Home              Anticipated d/c is to: Home              Patient currently is not medically stable to d/c.              Difficult to place patient No        Level of care: Med-Surg   Procedures:   No admission procedures for hospital encounter.    Antimicrobials:  Anti-infectives (From admission, onward)    Start     Dose/Rate Route Frequency Ordered Stop   07/10/21 1000  remdesivir 100 mg in sodium chloride 0.9 % 100 mL IVPB  Status:  Discontinued       See Hyperspace for full Linked Orders Report.   100 mg 200 mL/hr over 30 Minutes Intravenous Daily 07/09/21 1426 07/09/21 1432   07/10/21 1000  remdesivir 100 mg in sodium chloride 0.9 % 100 mL IVPB        100 mg 200 mL/hr over 30 Minutes Intravenous Daily 07/09/21 1432 07/14/21 0959   07/09/21 1515  levofloxacin (LEVAQUIN) tablet 500 mg        500 mg Oral Daily 07/09/21 1502     07/09/21 1445  remdesivir 100 mg in sodium chloride 0.9 % 100 mL IVPB        100 mg 200 mL/hr over 30 Minutes Intravenous Every 30 min 07/09/21 1432 07/09/21 1709   07/09/21 1430  remdesivir 200 mg in sodium chloride 0.9% 250 mL IVPB  Status:  Discontinued       See Hyperspace for full Linked Orders Report.   200 mg 580 mL/hr over 30 Minutes Intravenous Once 07/09/21 1426 07/09/21 1432   07/09/21 0000  nirmatrelvir/ritonavir EUA, renal dosing, (PAXLOVID) TBPK  Status:  Discontinued        2 tablet Oral 2 times daily 07/09/21 1026 07/09/21         Medication:   vitamin C  500 mg Oral Daily   chlorpheniramine-HYDROcodone  5 mL Oral Q12H   docusate sodium  100 mg Oral BID   [START ON 07/11/2021] furosemide  40 mg Oral BID   guaiFENesin-dextromethorphan  10 mL Oral TID   heparin  5,000 Units Subcutaneous Q8H   levofloxacin  500 mg Oral Daily   losartan  25 mg Oral Daily   pantoprazole  40 mg  Oral Daily   [START ON 07/12/2021] predniSONE  50 mg Oral Daily   sertraline  100 mg Oral Daily   zinc sulfate  220 mg Oral Daily    acetaminophen, hydrALAZINE, Ipratropium-Albuterol, ondansetron **OR** ondansetron (ZOFRAN) IV, oxyCODONE, polyethylene glycol, sodium phosphate   Objective:   Vitals:   07/09/21 1845 07/09/21 2018 07/09/21 2032 07/10/21 0446  BP: (!) 157/90  (!) 154/83 (!) 165/80  Pulse: 70  65 65  Resp: (!) 21  19 19   Temp: 98.6 F (37 C)  98.3 F (36.8 C) 98.3 F (36.8 C)  TempSrc:  Oral     SpO2: 99% 99% 100% 92%  Weight: (!) 173.3 kg     Height: 5\' 8"  (1.727 m)       Intake/Output Summary (Last 24 hours) at 07/10/2021 1138 Last data filed at 07/10/2021 0900 Gross per 24 hour  Intake 681.11 ml  Output --  Net 681.11 ml   Filed Weights   07/09/21 0807 07/09/21 1845  Weight: (!) 173.3 kg (!) 173.3 kg     Examination:   Physical Exam  Constitution:  Alert, cooperative, no distress,  Appears calm and comfortable  Psychiatric: Normal and stable mood and affect, cognition intact,   HEENT: Normocephalic, PERRL, otherwise with in Normal limits  Chest:Chest symmetric Cardio vascular:  S1/S2, RRR, No murmure, No Rubs or Gallops  pulmonary: Positive breath sounds diffusely, diffuse wheezing and rhonchi, Crackles... No use of accessory muscles, mild shortness of breath, normal respiratory efforts abdomen: Soft, non-tender, non-distended, bowel sounds,no masses, no organomegaly Muscular skeletal: Limited exam - in bed, able to move all 4 extremities, Normal strength,  Neuro: CNII-XII intact. , normal motor and sensation, reflexes intact  Extremities: No pitting edema lower extremities, +2 pulses  Skin: Dry, warm to touch, negative for any Rashes, No open wounds Wounds: per nursing documentation    ------------------------------------------------------------------------------------------------------------------------------------------    LABs:  CBC Latest  Ref Rng & Units 07/09/2021 10/31/2020 06/25/2019  WBC 4.0 - 10.5 K/uL 8.4 6.3 7.9  Hemoglobin 12.0 - 15.0 g/dL 08/26/2019 46.2 11.8(L)  Hematocrit 36.0 - 46.0 % 41.7 45.7 41.2  Platelets 150 - 400 K/uL 280 258 252   CMP Latest Ref Rng & Units 07/10/2021 07/09/2021 11/04/2020  Glucose 70 - 99 mg/dL 11/06/2020) 90 89  BUN 6 - 20 mg/dL 15 10 500(X)  Creatinine 0.44 - 1.00 mg/dL 38(H 8.29 9.37)  Sodium 135 - 145 mmol/L 138 138 140  Potassium 3.5 - 5.1 mmol/L 4.6 3.8 3.3(L)  Chloride 98 - 111 mmol/L 98 95(L) 87(L)  CO2 22 - 32 mmol/L 31 33(H) 42(H)  Calcium 8.9 - 10.3 mg/dL 1.69(C) 9.0 9.5  Total Protein 6.5 - 8.1 g/dL 8.2(H) - -  Total Bilirubin 0.3 - 1.2 mg/dL 0.7 - -  Alkaline Phos 38 - 126 U/L 67 - -  AST 15 - 41 U/L 15 - -  ALT 0 - 44 U/L 17 - -       Micro Results Recent Results (from the past 240 hour(s))  Resp Panel by RT-PCR (Flu A&B, Covid) Nasopharyngeal Swab     Status: Abnormal   Collection Time: 07/09/21  8:20 AM   Specimen: Nasopharyngeal Swab; Nasopharyngeal(NP) swabs in vial transport medium  Result Value Ref Range Status   SARS Coronavirus 2 by RT PCR POSITIVE (A) NEGATIVE Final    Comment: RESULT CALLED TO, READ BACK BY AND VERIFIED WITH:  LONG,LINDA @ 1020 ON 07/09/21 BY STEPHTR (NOTE) SARS-CoV-2 target nucleic acids are DETECTED.  The SARS-CoV-2 RNA is generally detectable in upper respiratory specimens during the acute phase of infection. Positive results are indicative of the presence of the identified virus, but do not rule out bacterial infection or co-infection with other pathogens not detected by the test. Clinical correlation with patient history and other diagnostic information is necessary to determine patient infection status. The expected result is Negative.  Fact Sheet for Patients: 07/11/21  Fact Sheet for Healthcare Providers: BloggerCourse.com  This test is not yet approved or cleared by the  [place] FDA and  has been authorized for detection and/or  diagnosis of SARS-CoV-2 by FDA under an Emergency Use Authorization (EUA).  This EUA will remain in effect (meaning this test  can be used) for the duration of  the COVID-19 declaration under Section 564(b)(1) of the Act, 21 U.S.C. section 360bbb-3(b)(1), unless the authorization is terminated or revoked sooner.     Influenza A by PCR NEGATIVE NEGATIVE Final   Influenza B by PCR NEGATIVE NEGATIVE Final    Comment: (NOTE) The Xpert Xpress SARS-CoV-2/FLU/RSV plus assay is intended as an aid in the diagnosis of influenza from Nasopharyngeal swab specimens and should not be used as a sole basis for treatment. Nasal washings and aspirates are unacceptable for Xpert Xpress SARS-CoV-2/FLU/RSV testing.  Fact Sheet for Patients: BloggerCourse.comhttps://www.fda.gov/media/152166/download  Fact Sheet for Healthcare Providers: SeriousBroker.ithttps://www.fda.gov/media/152162/download  This test is not yet approved or cleared by the [place] FDA and has been authorized for detection and/or diagnosis of SARS-CoV-2 by FDA under an Emergency Use Authorization (EUA). This EUA will remain in effect (meaning this test can be used) for the duration of the COVID-19 declaration under Section 564(b)(1) of the Act, 21 U.S.C. section 360bbb-3(b)(1), unless the authorization is terminated or revoked.  Performed at [facility], [address] [place], [place] [id number]     Radiology Reports DG Chest Portable 1 View  Result Date: 07/09/2021 CLINICAL DATA:  Cough that is productive EXAM: PORTABLE CHEST 1 VIEW COMPARISON:  10/31/2020 FINDINGS: Low volume chest with interstitial crowding. There is no edema, consolidation, effusion, or pneumothorax. Chronic cardiomegaly. IMPRESSION: Stable low volume chest. Electronically Signed   By: Marnee SpringJonathon  Watts M.D.   On: 07/09/2021 09:00    SIGNED: Kendell BaneSeyed A Brailey Buescher, MD, FHM. [facility],  Pager (please use [email or web address] to  page/text) Please use Epic Secure Chat for non-urgent communication (7AM-7PM)  If 7PM-7AM, please contact night-coverage [email or web address], 07/10/2021, 11:38 AM

## 2021-07-11 DIAGNOSIS — U071 COVID-19: Secondary | ICD-10-CM | POA: Diagnosis not present

## 2021-07-11 LAB — LACTATE DEHYDROGENASE: LDH: 141 U/L (ref 98–192)

## 2021-07-11 LAB — COMPREHENSIVE METABOLIC PANEL
ALT: 14 U/L (ref 0–44)
AST: 13 U/L — ABNORMAL LOW (ref 15–41)
Albumin: 3.4 g/dL — ABNORMAL LOW (ref 3.5–5.0)
Alkaline Phosphatase: 66 U/L (ref 38–126)
Anion gap: 9 (ref 5–15)
BUN: 27 mg/dL — ABNORMAL HIGH (ref 6–20)
CO2: 34 mmol/L — ABNORMAL HIGH (ref 22–32)
Calcium: 8.6 mg/dL — ABNORMAL LOW (ref 8.9–10.3)
Chloride: 97 mmol/L — ABNORMAL LOW (ref 98–111)
Creatinine, Ser: 1.02 mg/dL — ABNORMAL HIGH (ref 0.44–1.00)
GFR, Estimated: >60 mL/min (ref >60)
Glucose, Bld: 135 mg/dL — ABNORMAL HIGH (ref 70–99)
Potassium: 4.7 mmol/L (ref 3.5–5.1)
Sodium: 140 mmol/L (ref 135–145)
Total Bilirubin: 0.5 mg/dL (ref 0.3–1.2)
Total Protein: 7.6 g/dL (ref 6.5–8.1)

## 2021-07-11 LAB — FERRITIN: Ferritin: 54 ng/mL (ref 11–307)

## 2021-07-11 LAB — C-REACTIVE PROTEIN: CRP: 2.8 mg/dL — ABNORMAL HIGH (ref <1.0)

## 2021-07-11 LAB — MAGNESIUM: Magnesium: 2 mg/dL (ref 1.7–2.4)

## 2021-07-11 LAB — D-DIMER, QUANTITATIVE: D-Dimer, Quant: 0.61 ug/mL-FEU — ABNORMAL HIGH (ref 0.00–0.50)

## 2021-07-11 MED ORDER — IPRATROPIUM-ALBUTEROL 20-100 MCG/ACT IN AERS: 1.0000 | INHALATION_SPRAY | Freq: Four times a day (QID) | RESPIRATORY_TRACT | Status: DC

## 2021-07-11 MED ORDER — IPRATROPIUM-ALBUTEROL 20-100 MCG/ACT IN AERS
1.0000 | INHALATION_SPRAY | Freq: Four times a day (QID) | RESPIRATORY_TRACT | Status: DC | PRN
  Administered 2021-07-11 – 2021-07-12 (×3): 1 via RESPIRATORY_TRACT

## 2021-07-11 MED ORDER — ALBUTEROL SULFATE HFA 108 (90 BASE) MCG/ACT IN AERS: 2.0000 | INHALATION_SPRAY | RESPIRATORY_TRACT | Status: DC | PRN

## 2021-07-11 MED ORDER — FUROSEMIDE 40 MG PO TABS
40.0000 mg | ORAL_TABLET | Freq: Two times a day (BID) | ORAL | Status: DC
  Administered 2021-07-12 – 2021-07-13 (×3): 40 mg via ORAL
  Filled 2021-07-11 (×3): qty 1

## 2021-07-11 NOTE — Progress Notes (Signed)
PROGRESS NOTE    Patient: Ann Giles                            PCP: April Manson, NP                    DOB: 04-May-1971            DOA: 07/09/2021 [id number]             DOS: 07/11/2021, 11:57 AM   LOS: 2 days   Date of Service: The patient was seen and examined on 07/11/2021  Subjective:  Patient was seen and examined, reporting improved cough, improved shortness of breath at rest, but worsening with a exertion. Baseline O2 demand 2 L, back to 4 L this morning satting 94%   Brief Narrative:   Tiera L Collington is a 50 y.o. female with medical history significant of morbid obesity, CHF, asthma, depression, hypertension, OSA, GERD, Chronic respiratory failure on 2 L of oxygen at home .. presented to ED in hypertensive state, complaining of body ache, fever, productive cough with green sputum congestion, for past week.  ED Course: Vitals: Temp 97.7, pulse 70, RR 22, BP 170/93, weight 273.3 Kg .. atting 90% on 4 L of oxygen Abnormal labs; CBC CMP BNP within normal limits SARS-CoV-2 positive Chest x-ray -stable lobe on chest IV stable and remdesivir initiated  Assessment & Plan:   Principal Problem:   COVID-19 virus infection Active Problems:   Acute respiratory failure with hypoxia (HCC)   Asthma   Hypertension   Depression   Acute diastolic CHF (congestive heart failure) (HCC)   Obesity, Class III, BMI 40-49.9 (morbid obesity) (HCC)   OSA treated with BiPAP   Gastroesophageal reflux disease   COVID-19 virus infection -Shortness of breath and cough improved at rest, worsened with exertion -O2 demand back to 4 L, satting 94% -Baseline home O2 2 L -Worsening shortness of breath and cough with exertion  -We will continue with IV steroids, and remdesivir (IV steroids will be tapered to p.o.) -Monitoring inflammatory markers--- improving -We will continue with bronchodilator inhaler, mucolytic's, as needed, -For cough we will utilize Tussionex,  Robitussin    Acute on chronic respiratory failure with hypoxia (HCC)  -Baseline O2 demand 2 L at home, back to 4 L via nasal cannula, satting 94% -Likely exacerbated by SARS-CoV-2 infection, with underlying asthma -On admission was satting 87%   on 2 L,  - Will continue to monitor closely, continue bronchodilator inhaler   Acute bronchitis with sputum production  -in the setting of SARS-CoV-2 infection, asthma -Initiating empiric p.o. antibiotics -Continue remdesivir, IV steroids .Marland Kitchen  Switching to p.o.,  bronchodilators    Active Problems:   Asthma -on steroids-high-dose steroids initiated, along with bronchodilator inhalers, stable    Hypertension  - stable  -resuming home medication of losartan, adding hydralazine, as needed IV hydralazine, cont. Lasix     Depression -stable, resume home medication of Zoloft    H/o Chronic diastolic CHF (congestive heart failure) (HCC)  -Holding Lasix today, monitoring daily weight, I's and O     Obesity, Class III,  - with Body mass index is 58.08 kg/m. counseled regarding weight loss, continue diet and exercise   History of OSA treated with BiPAP -resume after nightly   Gastroesophageal reflux disease -continue Protonix    Cultures: -none  Antimicrobial: -none    Consults called:  None -------------------------------------------------------------------------------------------------------------------------------------------- DVT prophylaxis: SCD/Compression  stockings and Heparin SQ Code Status:   Code Status: Full Code     Admission status: Patient will be admitted as Inpatient, with a greater than 2 midnight length of stay. Level of care: Med-Surg     Family Communication:  none at bedside (The above findings and plan of care has been discussed with patient in detail, the patient expressed understanding and agreement of above  plan) -------------------------------------------------------------------------------------------------------------------------------------------------   Disposition Plan: >3 days Status is: Inpatient   Remains inpatient appropriate because:Inpatient level of care appropriate due to severity of illness   Dispo: The patient is from: Home              Anticipated d/c is to: Home in 1-2 days               Patient currently is not medically stable to d/c.              Difficult to place patient No        Level of care: Med-Surg   Procedures:   No admission procedures for hospital encounter.    Antimicrobials:  Anti-infectives (From admission, onward)    Start     Dose/Rate Route Frequency Ordered Stop   07/10/21 1000  remdesivir 100 mg in sodium chloride 0.9 % 100 mL IVPB  Status:  Discontinued       See Hyperspace for full Linked Orders Report.   100 mg 200 mL/hr over 30 Minutes Intravenous Daily 07/09/21 1426 07/09/21 1432   07/10/21 1000  remdesivir 100 mg in sodium chloride 0.9 % 100 mL IVPB        100 mg 200 mL/hr over 30 Minutes Intravenous Daily 07/09/21 1432 07/14/21 0959   07/09/21 1515  levofloxacin (LEVAQUIN) tablet 500 mg        500 mg Oral Daily 07/09/21 1502     07/09/21 1445  remdesivir 100 mg in sodium chloride 0.9 % 100 mL IVPB        100 mg 200 mL/hr over 30 Minutes Intravenous Every 30 min 07/09/21 1432 07/09/21 1709   07/09/21 1430  remdesivir 200 mg in sodium chloride 0.9% 250 mL IVPB  Status:  Discontinued       See Hyperspace for full Linked Orders Report.   200 mg 580 mL/hr over 30 Minutes Intravenous Once 07/09/21 1426 07/09/21 1432   07/09/21 0000  nirmatrelvir/ritonavir EUA, renal dosing, (PAXLOVID) TBPK  Status:  Discontinued        2 tablet Oral 2 times daily 07/09/21 1026 07/09/21         Medication:   vitamin C  500 mg Oral Daily   chlorpheniramine-HYDROcodone  5 mL Oral Q12H   docusate sodium  100 mg Oral BID   [START ON 07/12/2021]  furosemide  40 mg Oral BID   guaiFENesin-dextromethorphan  10 mL Oral TID   heparin  5,000 Units Subcutaneous Q8H   hydrALAZINE  50 mg Oral Q8H   levofloxacin  500 mg Oral Daily   losartan  25 mg Oral Daily   pantoprazole  40 mg Oral Daily   [START ON 07/12/2021] predniSONE  50 mg Oral Daily   sertraline  100 mg Oral Daily   zinc sulfate  220 mg Oral Daily    acetaminophen, hydrALAZINE, Ipratropium-Albuterol, ondansetron **OR** ondansetron (ZOFRAN) IV, oxyCODONE, polyethylene glycol, sodium phosphate   Objective:   Vitals:   07/10/21 0446 07/10/21 1242 07/10/21 2152 07/11/21 0554  BP: (!) 165/80 (!) 157/78 (!) 147/80 (!) 141/80  Pulse: 65 68 88 (!) 51  Resp: 19 19 18 18   Temp: 98.3 F (36.8 C) 98.9 F (37.2 C) 97.7 F (36.5 C) 98.1 F (36.7 C)  TempSrc:  Oral Oral   SpO2: 92% 96% 97% 94%  Weight:      Height:        Intake/Output Summary (Last 24 hours) at 07/11/2021 1157 Last data filed at 07/11/2021 0230 Gross per 24 hour  Intake 752.8 ml  Output 600 ml  Net 152.8 ml   Filed Weights   07/09/21 0807 07/09/21 1845  Weight: (!) 173.3 kg (!) 173.3 kg     Examination:      Physical Exam:   General:  Alert, oriented, cooperative, morbidly obese female, remained on 4 L of oxygen  HEENT:  Normocephalic, PERRL, otherwise with in Normal limits   Neuro:  CNII-XII intact. , normal motor and sensation, reflexes intact   Lungs:   Scattered wheezes rhonchi's-crackles   Respirations unlabored,   Cardio:    S1/S2, RRR, No murmure, No Rubs or Gallops   Abdomen:   Soft, non-tender, bowel sounds active all four quadrants,  no guarding or peritoneal signs.  Muscular skeletal:  Limited exam - in bed, able to move all 4 extremities, Normal strength,  2+ pulses,  symmetric, No pitting edema  Skin:  Dry, warm to touch, negative for any Rashes,  Wounds: Please see nursing documentation           ------------------------------------------------------------------------------------------------------------------------------------------    LABs:  CBC Latest Ref Rng & Units 07/09/2021 10/31/2020 06/25/2019  WBC 4.0 - 10.5 K/uL 8.4 6.3 7.9  Hemoglobin 12.0 - 15.0 g/dL 08/26/2019 66.2 11.8(L)  Hematocrit 36.0 - 46.0 % 41.7 45.7 41.2  Platelets 150 - 400 K/uL 280 258 252   CMP Latest Ref Rng & Units 07/11/2021 07/10/2021 07/09/2021  Glucose 70 - 99 mg/dL 07/11/2021) 654(Y) 90  BUN 6 - 20 mg/dL 503(T) 15 10  Creatinine 0.44 - 1.00 mg/dL 46(F) 6.81(E 7.51  Sodium 135 - 145 mmol/L 140 138 138  Potassium 3.5 - 5.1 mmol/L 4.7 4.6 3.8  Chloride 98 - 111 mmol/L 97(L) 98 95(L)  CO2 22 - 32 mmol/L 34(H) 31 33(H)  Calcium 8.9 - 10.3 mg/dL 7.00) 1.7(C) 9.0  Total Protein 6.5 - 8.1 g/dL 7.6 9.4(W) -  Total Bilirubin 0.3 - 1.2 mg/dL 0.5 0.7 -  Alkaline Phos 38 - 126 U/L 66 67 -  AST 15 - 41 U/L 13(L) 15 -  ALT 0 - 44 U/L 14 17 -       Micro Results Recent Results (from the past 240 hour(s))  Resp Panel by RT-PCR (Flu A&B, Covid) Nasopharyngeal Swab     Status: Abnormal   Collection Time: 07/09/21  8:20 AM   Specimen: Nasopharyngeal Swab; Nasopharyngeal(NP) swabs in vial transport medium  Result Value Ref Range Status   SARS Coronavirus 2 by RT PCR POSITIVE (A) NEGATIVE Final    Comment: RESULT CALLED TO, READ BACK BY AND VERIFIED WITH:  LONG,LINDA @ 1020 ON 07/09/21 BY STEPHTR (NOTE) SARS-CoV-2 target nucleic acids are DETECTED.  The SARS-CoV-2 RNA is generally detectable in upper respiratory specimens during the acute phase of infection. Positive results are indicative of the presence of the identified virus, but do not rule out bacterial infection or co-infection with other pathogens not detected by the test. Clinical correlation with patient history and other diagnostic information is necessary to determine patient infection status. The expected result is Negative.  Fact Sheet  for  Patients: BloggerCourse.com  Fact Sheet for Healthcare Providers: SeriousBroker.it  This test is not yet approved or cleared by the [place] FDA and  has been authorized for detection and/or diagnosis of SARS-CoV-2 by FDA under an Emergency Use Authorization (EUA).  This EUA will remain in effect (meaning this test  can be used) for the duration of  the COVID-19 declaration under Section 564(b)(1) of the Act, 21 U.S.C. section 360bbb-3(b)(1), unless the authorization is terminated or revoked sooner.     Influenza A by PCR NEGATIVE NEGATIVE Final   Influenza B by PCR NEGATIVE NEGATIVE Final    Comment: (NOTE) The Xpert Xpress SARS-CoV-2/FLU/RSV plus assay is intended as an aid in the diagnosis of influenza from Nasopharyngeal swab specimens and should not be used as a sole basis for treatment. Nasal washings and aspirates are unacceptable for Xpert Xpress SARS-CoV-2/FLU/RSV testing.  Fact Sheet for Patients: BloggerCourse.com  Fact Sheet for Healthcare Providers: SeriousBroker.it  This test is not yet approved or cleared by the [place] FDA and has been authorized for detection and/or diagnosis of SARS-CoV-2 by FDA under an Emergency Use Authorization (EUA). This EUA will remain in effect (meaning this test can be used) for the duration of the COVID-19 declaration under Section 564(b)(1) of the Act, 21 U.S.C. section 360bbb-3(b)(1), unless the authorization is terminated or revoked.  Performed at [facility], [address], [place], [place] [address]     Radiology Reports DG Chest Portable 1 View  Result Date: 07/09/2021 CLINICAL DATA:  Cough that is productive EXAM: PORTABLE CHEST 1 VIEW COMPARISON:  10/31/2020 FINDINGS: Low volume chest with interstitial crowding. There is no edema, consolidation, effusion, or pneumothorax. Chronic cardiomegaly. IMPRESSION: Stable  low volume chest. Electronically Signed   By: Marnee Spring M.D.   On: 07/09/2021 09:00    SIGNED: Kendell Bane, MD, FHM. [facility],  Pager (please use [email or web address] to page/text) Please use Epic Secure Chat for non-urgent communication (7AM-7PM)  If 7PM-7AM, please contact night-coverage [email or web address], 07/11/2021, 11:58 AM

## 2021-07-12 DIAGNOSIS — U071 COVID-19: Secondary | ICD-10-CM | POA: Diagnosis not present

## 2021-07-12 LAB — COMPREHENSIVE METABOLIC PANEL
ALT: 12 U/L (ref 0–44)
AST: 11 U/L — ABNORMAL LOW (ref 15–41)
Albumin: 3.3 g/dL — ABNORMAL LOW (ref 3.5–5.0)
Alkaline Phosphatase: 57 U/L (ref 38–126)
Anion gap: 9 (ref 5–15)
BUN: 37 mg/dL — ABNORMAL HIGH (ref 6–20)
CO2: 33 mmol/L — ABNORMAL HIGH (ref 22–32)
Calcium: 8.2 mg/dL — ABNORMAL LOW (ref 8.9–10.3)
Chloride: 100 mmol/L (ref 98–111)
Creatinine, Ser: 0.96 mg/dL (ref 0.44–1.00)
GFR, Estimated: >60 mL/min (ref >60)
Glucose, Bld: 140 mg/dL — ABNORMAL HIGH (ref 70–99)
Potassium: 4.5 mmol/L (ref 3.5–5.1)
Sodium: 142 mmol/L (ref 135–145)
Total Bilirubin: 0.5 mg/dL (ref 0.3–1.2)
Total Protein: 7.2 g/dL (ref 6.5–8.1)

## 2021-07-12 LAB — D-DIMER, QUANTITATIVE: D-Dimer, Quant: 0.63 ug/mL-FEU — ABNORMAL HIGH (ref 0.00–0.50)

## 2021-07-12 LAB — LACTATE DEHYDROGENASE: LDH: 139 U/L (ref 98–192)

## 2021-07-12 LAB — FERRITIN: Ferritin: 51 ng/mL (ref 11–307)

## 2021-07-12 LAB — MAGNESIUM: Magnesium: 2.2 mg/dL (ref 1.7–2.4)

## 2021-07-12 LAB — C-REACTIVE PROTEIN: CRP: 1.3 mg/dL — ABNORMAL HIGH (ref <1.0)

## 2021-07-12 NOTE — Progress Notes (Signed)
PROGRESS NOTE    Patient: Ann Giles                            PCP: April Manson, NP                    DOB: [birth year]            DOA: 07/09/2021 [id number]             DOS: 07/12/2021, 9:38 AM   LOS: 3 days   Date of Service: The patient was seen and examined on 07/12/2021  Subjective:  The patient was seen and examined this morning, stable, reporting improved cough, shortness of breath but still requiring 4 L of oxygen, but satting 99%    Brief Narrative:   Cher L Collington is a 50 y.o. female with medical history significant of morbid obesity, CHF, asthma, depression, hypertension, OSA, GERD, Chronic respiratory failure on 2 L of oxygen at home .. presented to ED in hypertensive state, complaining of body ache, fever, productive cough with green sputum congestion, for past week.  ED Course: Vitals: Temp 97.7, pulse 70, RR 22, BP 170/93, weight 273.3 Kg .. atting 90% on 4 L of oxygen Abnormal labs; CBC CMP BNP within normal limits SARS-CoV-2 positive Chest x-ray -stable lobe on chest IV stable and remdesivir initiated  Assessment & Plan:   Principal Problem:   COVID-19 virus infection Active Problems:   Acute respiratory failure with hypoxia (HCC)   Asthma   Hypertension   Depression   Acute diastolic CHF (congestive heart failure) (HCC)   Obesity, Class III, BMI 40-49.9 (morbid obesity) (HCC)   OSA treated with BiPAP   Gastroesophageal reflux disease   COVID-19 virus infection -Shortness of breath and cough improved at rest, worsened with exertion -O2 demand still 4 L, but satting 99%, goal to taper down -Baseline home O2 2 L -Worsening shortness of breath and cough with exertion  -We will continue with IV steroids, and remdesivir (IV steroids will be tapered to p.o.) -Monitoring inflammatory markers--- continue to improve -We will continue with bronchodilator inhaler, mucolytic's, as needed, -For cough we will utilize Tussionex, Robitussin     Acute on chronic respiratory failure with hypoxia (HCC)  -Baseline O2 demand 2 L at home,  - still on4 L via nasal cannula, satting 99% --we will taper her down -Likely exacerbated by SARS-CoV-2 infection, with underlying Asthma -On admission was satting 87%   on 2 L,  - Will continue to monitor closely, continue bronchodilator inhaler   Acute bronchitis with sputum production  -in the setting of SARS-CoV-2 infection, asthma -Initiating empiric p.o. antibiotics -Continue remdesivir, IV steroids .Marland Kitchen  Switching to p.o.,  bronchodilators    Active Problems:   Asthma -stable, improved wheezing, on steroids-high-dose steroids initiated, along with bronchodilator inhalers, stable    Hypertension  - stable  -resuming home medication of losartan, adding hydralazine, as needed IV hydralazine, cont. Lasix     Depression -stable  resume home medication of Zoloft    H/o Chronic diastolic CHF (congestive heart failure) (HCC)  -Resuming Lasix today, monitoring daily weight, I's and O   Obesity, Class III,  - with Body mass index is 58.08 kg/m. counseled regarding weight loss, continue diet and exercise   History of OSA treated with BiPAP -resume after nightly   Gastroesophageal reflux disease -continue Protonix    Cultures: -none  Antimicrobial: -none  Consults called:  None -------------------------------------------------------------------------------------------------------------------------------------------- DVT prophylaxis: SCD/Compression stockings and Heparin SQ Code Status:   Code Status: Full Code     Admission status: Patient will be admitted as Inpatient, with a greater than 2 midnight length of stay. Level of care: Med-Surg     Family Communication:  none at bedside (The above findings and plan of care has been discussed with patient in detail, the patient expressed understanding and agreement of above  plan) -------------------------------------------------------------------------------------------------------------------------------------------------   Disposition Plan: >3 days Status is: Inpatient   Remains inpatient appropriate because:Inpatient level of care appropriate due to severity of illness   Dispo: The patient is from: Home              Anticipated d/c is to: Home in 1-2 days               Patient currently is not medically stable to d/c.              Difficult to place patient No        Level of care: Med-Surg   Procedures:   No admission procedures for hospital encounter.    Antimicrobials:  Anti-infectives (From admission, onward)    Start     Dose/Rate Route Frequency Ordered Stop   07/10/21 1000  remdesivir 100 mg in sodium chloride 0.9 % 100 mL IVPB  Status:  Discontinued       See Hyperspace for full Linked Orders Report.   100 mg 200 mL/hr over 30 Minutes Intravenous Daily 07/09/21 1426 07/09/21 1432   07/10/21 1000  remdesivir 100 mg in sodium chloride 0.9 % 100 mL IVPB        100 mg 200 mL/hr over 30 Minutes Intravenous Daily 07/09/21 1432 07/14/21 0959   07/09/21 1515  levofloxacin (LEVAQUIN) tablet 500 mg        500 mg Oral Daily 07/09/21 1502     07/09/21 1445  remdesivir 100 mg in sodium chloride 0.9 % 100 mL IVPB        100 mg 200 mL/hr over 30 Minutes Intravenous Every 30 min 07/09/21 1432 07/09/21 1709   07/09/21 1430  remdesivir 200 mg in sodium chloride 0.9% 250 mL IVPB  Status:  Discontinued       See Hyperspace for full Linked Orders Report.   200 mg 580 mL/hr over 30 Minutes Intravenous Once 07/09/21 1426 07/09/21 1432   07/09/21 0000  nirmatrelvir/ritonavir EUA, renal dosing, (PAXLOVID) TBPK  Status:  Discontinued        2 tablet Oral 2 times daily 07/09/21 1026 07/09/21         Medication:   vitamin C  500 mg Oral Daily   chlorpheniramine-HYDROcodone  5 mL Oral Q12H   docusate sodium  100 mg Oral BID   furosemide  40 mg  Oral BID   guaiFENesin-dextromethorphan  10 mL Oral TID   heparin  5,000 Units Subcutaneous Q8H   hydrALAZINE  50 mg Oral Q8H   levofloxacin  500 mg Oral Daily   losartan  25 mg Oral Daily   pantoprazole  40 mg Oral Daily   predniSONE  50 mg Oral Daily   sertraline  100 mg Oral Daily   zinc sulfate  220 mg Oral Daily    acetaminophen, albuterol, hydrALAZINE, Ipratropium-Albuterol, ondansetron **OR** ondansetron (ZOFRAN) IV, oxyCODONE, polyethylene glycol, sodium phosphate   Objective:   Vitals:   07/11/21 2006 07/11/21 2031 07/11/21 2316 07/12/21 0357  BP:  (!) 151/82  138/63  Pulse:  75  (!) 59  Resp:  18  20  Temp:  98.2 F (36.8 C)  98 F (36.7 C)  TempSrc:    Oral  SpO2: 95% 96% 96% 99%  Weight:      Height:        Intake/Output Summary (Last 24 hours) at 07/12/2021 [id number] Last data filed at 07/12/2021 0500 Gross per 24 hour  Intake --  Output 600 ml  Net -600 ml   Filed Weights   07/09/21 0807 07/09/21 1845  Weight: (!) 173.3 kg (!) 173.3 kg     Examination:      Physical Exam:   General:  Alert, oriented, cooperative, no distress; shortness of breath with exertion  HEENT:  Normocephalic, PERRL, otherwise with in Normal limits   Neuro:  CNII-XII intact. , normal motor and sensation, reflexes intact   Lungs:   Clear to auscultation BL, Respirations unlabored, improved wheezes / no  crackles  Cardio:    S1/S2, RRR, No murmure, No Rubs or Gallops   Abdomen:   Soft, non-tender, bowel sounds active all four quadrants,  no guarding or peritoneal signs.  Muscular skeletal:  Limited exam - in bed, able to move all 4 extremities, Normal strength,  2+ pulses,  symmetric, No pitting edema  Skin:  Dry, warm to touch, negative for any Rashes,  Wounds: Please see nursing documentation             ------------------------------------------------------------------------------------------------------------------------------------------    LABs:  CBC Latest Ref  Rng & Units 07/09/2021 10/31/2020 06/25/2019  WBC 4.0 - 10.5 K/uL 8.4 6.3 7.9  Hemoglobin 12.0 - 15.0 g/dL 96.012.7 45.413.1 11.8(L)  Hematocrit 36.0 - 46.0 % 41.7 45.7 41.2  Platelets 150 - 400 K/uL 280 258 252   CMP Latest Ref Rng & Units 07/12/2021 07/11/2021 07/10/2021  Glucose 70 - 99 mg/dL 098(J140(H) 191(Y135(H) 782(N130(H)  BUN 6 - 20 mg/dL 56(O37(H) 13(Y27(H) 15  Creatinine 0.44 - 1.00 mg/dL 8.650.96 7.84(O1.02(H) 9.620.83  Sodium 135 - 145 mmol/L 142 140 138  Potassium 3.5 - 5.1 mmol/L 4.5 4.7 4.6  Chloride 98 - 111 mmol/L 100 97(L) 98  CO2 22 - 32 mmol/L 33(H) 34(H) 31  Calcium 8.9 - 10.3 mg/dL 8.2(L) 8.6(L) 8.8(L)  Total Protein 6.5 - 8.1 g/dL 7.2 7.6 9.5(M8.2(H)  Total Bilirubin 0.3 - 1.2 mg/dL 0.5 0.5 0.7  Alkaline Phos 38 - 126 U/L 57 66 67  AST 15 - 41 U/L 11(L) 13(L) 15  ALT 0 - 44 U/L 12 14 17        Micro Results Recent Results (from the past 240 hour(s))  Resp Panel by RT-PCR (Flu A&B, Covid) Nasopharyngeal Swab     Status: Abnormal   Collection Time: 07/09/21  8:20 AM   Specimen: Nasopharyngeal Swab; Nasopharyngeal(NP) swabs in vial transport medium  Result Value Ref Range Status   SARS Coronavirus 2 by RT PCR POSITIVE (A) NEGATIVE Final    Comment: RESULT CALLED TO, READ BACK BY AND VERIFIED WITH:  LONG,LINDA @ 1020 ON 07/09/21 BY STEPHTR (NOTE) SARS-CoV-2 target nucleic acids are DETECTED.  The SARS-CoV-2 RNA is generally detectable in upper respiratory specimens during the acute phase of infection. Positive results are indicative of the presence of the identified virus, but do not rule out bacterial infection or co-infection with other pathogens not detected by the test. Clinical correlation with patient history and other diagnostic information is necessary to determine patient infection status. The expected result is Negative.  Fact Sheet for Patients:  BloggerCourse.com  Fact Sheet for Healthcare Providers: SeriousBroker.it  This test is not yet  approved or cleared by the [place] FDA and  has been authorized for detection and/or diagnosis of SARS-CoV-2 by FDA under an Emergency Use Authorization (EUA).  This EUA will remain in effect (meaning this test  can be used) for the duration of  the COVID-19 declaration under Section 564(b)(1) of the Act, 21 U.S.C. section 360bbb-3(b)(1), unless the authorization is terminated or revoked sooner.     Influenza A by PCR NEGATIVE NEGATIVE Final   Influenza B by PCR NEGATIVE NEGATIVE Final    Comment: (NOTE) The Xpert Xpress SARS-CoV-2/FLU/RSV plus assay is intended as an aid in the diagnosis of influenza from Nasopharyngeal swab specimens and should not be used as a sole basis for treatment. Nasal washings and aspirates are unacceptable for Xpert Xpress SARS-CoV-2/FLU/RSV testing.  Fact Sheet for Patients: BloggerCourse.com  Fact Sheet for Healthcare Providers: SeriousBroker.it  This test is not yet approved or cleared by the [place] FDA and has been authorized for detection and/or diagnosis of SARS-CoV-2 by FDA under an Emergency Use Authorization (EUA). This EUA will remain in effect (meaning this test can be used) for the duration of the COVID-19 declaration under Section 564(b)(1) of the Act, 21 U.S.C. section 360bbb-3(b)(1), unless the authorization is terminated or revoked.  Performed at [facility], [address] [place], [place] [address]     Radiology Reports DG Chest Portable 1 View  Result Date: 07/09/2021 CLINICAL DATA:  Cough that is productive EXAM: PORTABLE CHEST 1 VIEW COMPARISON:  10/31/2020 FINDINGS: Low volume chest with interstitial crowding. There is no edema, consolidation, effusion, or pneumothorax. Chronic cardiomegaly. IMPRESSION: Stable low volume chest. Electronically Signed   By: Marnee Spring M.D.   On: 07/09/2021 09:00    SIGNED: Kendell Bane, MD, FHM. [facility],  Pager  (please use [email or web address] to page/text) Please use Epic Secure Chat for non-urgent communication (7AM-7PM)  If 7PM-7AM, please contact night-coverage [email or web address], 07/12/2021, 9:38 AM

## 2021-07-13 DIAGNOSIS — U071 COVID-19: Secondary | ICD-10-CM | POA: Diagnosis not present

## 2021-07-13 LAB — COMPREHENSIVE METABOLIC PANEL
ALT: 13 U/L (ref 0–44)
AST: 10 U/L — ABNORMAL LOW (ref 15–41)
Albumin: 3.3 g/dL — ABNORMAL LOW (ref 3.5–5.0)
Alkaline Phosphatase: 54 U/L (ref 38–126)
Anion gap: 6 (ref 5–15)
BUN: 40 mg/dL — ABNORMAL HIGH (ref 6–20)
CO2: 34 mmol/L — ABNORMAL HIGH (ref 22–32)
Calcium: 8 mg/dL — ABNORMAL LOW (ref 8.9–10.3)
Chloride: 100 mmol/L (ref 98–111)
Creatinine, Ser: 1.04 mg/dL — ABNORMAL HIGH (ref 0.44–1.00)
GFR, Estimated: >60 mL/min (ref >60)
Glucose, Bld: 110 mg/dL — ABNORMAL HIGH (ref 70–99)
Potassium: 4.2 mmol/L (ref 3.5–5.1)
Sodium: 140 mmol/L (ref 135–145)
Total Bilirubin: 0.6 mg/dL (ref 0.3–1.2)
Total Protein: 7.3 g/dL (ref 6.5–8.1)

## 2021-07-13 LAB — D-DIMER, QUANTITATIVE: D-Dimer, Quant: 0.47 ug{FEU}/mL (ref 0.00–0.50)

## 2021-07-13 LAB — MAGNESIUM: Magnesium: 2.3 mg/dL (ref 1.7–2.4)

## 2021-07-13 LAB — FERRITIN: Ferritin: 57 ng/mL (ref 11–307)

## 2021-07-13 LAB — LACTATE DEHYDROGENASE: LDH: 135 U/L (ref 98–192)

## 2021-07-13 LAB — C-REACTIVE PROTEIN: CRP: 0.7 mg/dL (ref <1.0)

## 2021-07-13 MED ORDER — ASCORBIC ACID 500 MG PO TABS: 500.0000 mg | ORAL_TABLET | Freq: Every day | ORAL | 0 refills | Status: AC

## 2021-07-13 MED ORDER — METHYLPREDNISOLONE 4 MG PO TBPK: ORAL_TABLET | ORAL | 0 refills | Status: DC

## 2021-07-13 MED ORDER — IPRATROPIUM-ALBUTEROL 20-100 MCG/ACT IN AERS: 1.0000 | INHALATION_SPRAY | Freq: Four times a day (QID) | RESPIRATORY_TRACT | 3 refills | Status: DC | PRN

## 2021-07-13 MED ORDER — GUAIFENESIN-DM 100-10 MG/5ML PO SYRP: 10.0000 mL | ORAL_SOLUTION | Freq: Three times a day (TID) | ORAL | 0 refills | Status: AC

## 2021-07-13 MED ORDER — LEVOFLOXACIN 500 MG PO TABS: 500.0000 mg | ORAL_TABLET | Freq: Every day | ORAL | 0 refills | Status: AC

## 2021-07-13 MED ORDER — OMEPRAZOLE 40 MG PO CPDR: 40.0000 mg | DELAYED_RELEASE_CAPSULE | Freq: Every day | ORAL | 2 refills | Status: AC

## 2021-07-13 MED ORDER — ZINC SULFATE 220 (50 ZN) MG PO CAPS: 220.0000 mg | ORAL_CAPSULE | Freq: Every day | ORAL | 0 refills | Status: AC

## 2021-07-13 MED ORDER — SERTRALINE HCL 100 MG PO TABS: 100.0000 mg | ORAL_TABLET | Freq: Every day | ORAL | 0 refills | Status: DC

## 2021-07-13 NOTE — Discharge Summary (Signed)
Physician Discharge Summary [facility]    Patient: Ann Giles                   Admit date: 07/09/2021   DOB: [birth year]             Discharge date:07/13/2021/7:53 AM ZOX:[id number]                          PCP: April MansonWhite, Marsha L, NP  Disposition: HOME  Recommendations for Outpatient Follow-up:   Follow up: With PCP 1-2 weeks Recommending referral to pulmonologist within the next 3-6 weeks Note to PCP-referral to weight loss program urgently  Discharge Condition: Stable   Code Status:   Code Status: Full Code  Diet recommendation: Cardiac diet   Discharge Diagnoses:    Principal Problem:   COVID-19 virus infection Active Problems:   Acute respiratory failure with hypoxia (HCC)   Asthma   Hypertension   Depression   Acute diastolic CHF (congestive heart failure) (HCC)   Obesity, Class III, BMI 40-49.9 (morbid obesity) (HCC)   OSA treated with BiPAP   Gastroesophageal reflux disease   History of Present Illness/ Hospital Course Charline Bills/Brief Summary:   Ann Giles is a 50 y.o. female with medical history significant of morbid obesity, CHF, asthma, depression, hypertension, OSA, GERD, Chronic respiratory failure on 2 L of oxygen at home .. presented to ED in hypertensive state, complaining of body ache, fever, productive cough with green sputum congestion, for past week.   ED Course: Vitals: Temp 97.7, pulse 70, RR 22, BP 170/93, weight 273.3 Kg .. atting 90% on 4 L of oxygen Abnormal labs; CBC CMP BNP within normal limits SARS-CoV-2 positive Chest x-ray -stable lobe on chest IV stable and remdesivir initiated    COVID-19 virus infection -Improved shortness of breath, patient was weaned down from 4 L, to baseline 2 L of oxygen via nasal cannula now satting greater 92% -Overall responded well to COVID-19 treatment, completed 5-day course of remdesivir -IV steroids has been tapered down to p.o. now continue inhalers, continue mucolytic's   -We will  continue with IV steroids, and remdesivir (IV steroids will be tapered to p.o.) -Monitoring inflammatory markers--which improved -We will continue with bronchodilator inhaler, mucolytic's, as needed,     Acute on chronic respiratory failure with hypoxia (HCC)  -Improved, has been weaned down from 4 L to 2 L of oxygen overnight, currently satting greater 92% -Likely exacerbated by SARS-CoV-2 infection, with underlying Asthma -On admission was satting 87%   on 2 L,  -Instructed to continue incentive spirometer, continue inhalers, taper steroids,    Acute bronchitis with sputum production -in the setting of SARS-CoV-2 infection, asthma -Initiating empiric p.o. antibiotics Levaquin for total of 5 days   Switching to p.o.,  bronchodilators     Active Problems:   Asthma -stable, improved wheezing, status post treated with steroids-high-dose steroids initiated, along with bronchodilator inhalers, steroid switched to p.o., with quick taper    Hypertension  - stable  -resuming home medication of losartan, adding hydralazine, as needed IV hydralazine, cont. Lasix     Depression -stable  resume home medication of Zoloft    H/o Chronic diastolic CHF (congestive heart failure) (HCC)  -Resuming Lasix today, monitoring daily weight, I's and O   Obesity, Class III,  - with Body mass index is 58.08 kg/m. counseled regarding weight loss, continue diet and exercise Recommending urgent referral to weight loss program   History  of OSA treated with BiPAP -resume after nightly   Gastroesophageal reflux disease -continue Protonix     Cultures: -none  Antimicrobial: -P.o. Levaquin    Code Status:   Code Status: Full Code --------------------------------------------------------------------------------------------------------------------------------------------   Disposition Plan:  Dispo: The patient is from: Home              Anticipated d/c is to: Home       Discharge Instructions:    Discharge Instructions     Activity as tolerated - No restrictions   Complete by: As directed    Call MD for:  difficulty breathing, headache or visual disturbances   Complete by: As directed    Call MD for:  persistant dizziness or light-headedness   Complete by: As directed    Call MD for:  severe uncontrolled pain   Complete by: As directed    Call MD for:  temperature >100.4   Complete by: As directed    Diet - low sodium heart healthy   Complete by: As directed    Discharge instructions   Complete by: As directed    Continue isolation for 5 more days... Days of isolation, continue steroid taper down, dosing, continue inhalers as instructed, continue inspirometer, breathing exercises while awake Ambulate as much as possible...   Increase activity slowly   Complete by: As directed         Medication List     TAKE these medications    albuterol 108 (90 Base) MCG/ACT inhaler Commonly known as: VENTOLIN HFA Inhale 2 puffs into the lungs every 6 (six) hours as needed for wheezing or shortness of breath.   ascorbic acid 500 MG tablet Commonly known as: VITAMIN C Take 1 tablet (500 mg total) by mouth daily for 5 days.   furosemide 40 MG tablet Commonly known as: LASIX Take 1 tablet (40 mg total) by mouth 2 (two) times daily. May take additional 20 mg for swelling Daily   guaiFENesin-dextromethorphan 100-10 MG/5ML syrup Commonly known as: ROBITUSSIN DM Take 10 mLs by mouth 3 (three) times daily for 5 days.   Ipratropium-Albuterol 20-100 MCG/ACT Aers respimat Commonly known as: COMBIVENT Inhale 1 puff into the lungs every 6 (six) hours as needed for wheezing.   levofloxacin 500 MG tablet Commonly known as: LEVAQUIN Take 1 tablet (500 mg total) by mouth daily for 3 days.   losartan 25 MG tablet Commonly known as: COZAAR Take 1 tablet (25 mg total) by mouth daily.   methylPREDNISolone 4 MG Tbpk tablet Commonly known as: MEDROL DOSEPAK Medrol Dosepak take as  instructed   omeprazole 40 MG capsule Commonly known as: PRILOSEC Take 1 capsule (40 mg total) by mouth daily.   OXYGEN Inhale 2 L into the lungs See admin instructions. Uses as needed during the day and nightly on schedule with BIPAP   Preparation H 0.25-14-74.9 % rectal ointment Generic drug: phenylephrine-shark liver oil-mineral oil-petrolatum Place 1 application rectally 2 (two) times daily as needed for hemorrhoids.   sertraline 100 MG tablet Commonly known as: ZOLOFT Take 1 tablet (100 mg total) by mouth daily. What changed: Another medication with the same name was removed. Continue taking this medication, and follow the directions you see here.   zinc sulfate 220 (50 Zn) MG capsule Take 1 capsule (220 mg total) by mouth daily for 5 days.        Follow-up Information     Call  April Manson, NP.   Specialty: Family Medicine Why: As needed Contact information:  [address] [place] [place] [address] [phone]                No Known Allergies   Procedures /Studies:   DG Chest Portable 1 View  Result Date: 07/09/2021 CLINICAL DATA:  Cough that is productive EXAM: PORTABLE CHEST 1 VIEW COMPARISON:  10/31/2020 FINDINGS: Low volume chest with interstitial crowding. There is no edema, consolidation, effusion, or pneumothorax. Chronic cardiomegaly. IMPRESSION: Stable low volume chest. Electronically Signed   By: Marnee Spring M.D.   On: 07/09/2021 09:00    Subjective:   Patient was seen and examined 07/13/2021, 7:53 AM Patient stable today. No acute distress.  No issues overnight Stable for discharge.  Discharge Exam:    Vitals:   07/12/21 1945 07/12/21 2110 07/12/21 2111 07/13/21 0546  BP:  127/61 127/61 136/69  Pulse:   64 (!) 58  Resp:   20 16  Temp:   97.8 F (36.6 C) 97.6 F (36.4 C)  TempSrc:   Oral Oral  SpO2: 95%  96% 99%  Weight:      Height:        General: Pt lying comfortably in bed & appears in no obvious  distress. Cardiovascular: S1 & S2 heard, RRR, S1/S2 +. No murmurs, rubs, gallops or clicks. No JVD or pedal edema. Respiratory: Clear to auscultation without wheezing, rhonchi or crackles. No increased work of breathing. Abdominal:  Non-distended, non-tender & soft. No organomegaly or masses appreciated. Normal bowel sounds heard. CNS: Alert and oriented. No focal deficits. Extremities: no edema, no cyanosis      The results of significant diagnostics from this hospitalization (including imaging, microbiology, ancillary and laboratory) are listed below for reference.      Microbiology:   Recent Results (from the past 240 hour(s))  Resp Panel by RT-PCR (Flu A&B, Covid) Nasopharyngeal Swab     Status: Abnormal   Collection Time: 07/09/21  8:20 AM   Specimen: Nasopharyngeal Swab; Nasopharyngeal(NP) swabs in vial transport medium  Result Value Ref Range Status   SARS Coronavirus 2 by RT PCR POSITIVE (A) NEGATIVE Final    Comment: RESULT CALLED TO, READ BACK BY AND VERIFIED WITH:  LONG,LINDA @ 1020 ON 07/09/21 BY STEPHTR (NOTE) SARS-CoV-2 target nucleic acids are DETECTED.  The SARS-CoV-2 RNA is generally detectable in upper respiratory specimens during the acute phase of infection. Positive results are indicative of the presence of the identified virus, but do not rule out bacterial infection or co-infection with other pathogens not detected by the test. Clinical correlation with patient history and other diagnostic information is necessary to determine patient infection status. The expected result is Negative.  Fact Sheet for Patients: BloggerCourse.com  Fact Sheet for Healthcare Providers: SeriousBroker.it  This test is not yet approved or cleared by the [place] FDA and  has been authorized for detection and/or diagnosis of SARS-CoV-2 by FDA under an Emergency Use Authorization (EUA).  This EUA will remain in effect  (meaning this test  can be used) for the duration of  the COVID-19 declaration under Section 564(b)(1) of the Act, 21 U.S.C. section 360bbb-3(b)(1), unless the authorization is terminated or revoked sooner.     Influenza A by PCR NEGATIVE NEGATIVE Final   Influenza B by PCR NEGATIVE NEGATIVE Final    Comment: (NOTE) The Xpert Xpress SARS-CoV-2/FLU/RSV plus assay is intended as an aid in the diagnosis of influenza from Nasopharyngeal swab specimens and should not be used as a sole basis for treatment. Nasal washings  and aspirates are unacceptable for Xpert Xpress SARS-CoV-2/FLU/RSV testing.  Fact Sheet for Patients: BloggerCourse.com  Fact Sheet for Healthcare Providers: SeriousBroker.it  This test is not yet approved or cleared by the [place] FDA and has been authorized for detection and/or diagnosis of SARS-CoV-2 by FDA under an Emergency Use Authorization (EUA). This EUA will remain in effect (meaning this test can be used) for the duration of the COVID-19 declaration under Section 564(b)(1) of the Act, 21 U.S.C. section 360bbb-3(b)(1), unless the authorization is terminated or revoked.  Performed at [facility], [address], [place], [place] [address]      Labs:   CBC: Recent Labs  Lab 07/09/21 1154  WBC 8.4  NEUTROABS 5.5  HGB 12.7  HCT 41.7  MCV 103.7*  PLT 280   Basic Metabolic Panel: Recent Labs  Lab 07/09/21 1154 07/10/21 0506 07/11/21 0530 07/12/21 0616 07/13/21 0645  NA 138 138 140 142 140  K 3.8 4.6 4.7 4.5 4.2  CL 95* 98 97* 100 100  CO2 33* 31 34* 33* 34*  GLUCOSE 90 130* 135* 140* 110*  BUN 10 15 27* 37* 40*  CREATININE 0.93 0.83 1.02* 0.96 1.04*  CALCIUM 9.0 8.8* 8.6* 8.2* 8.0*  MG  --  2.0 2.0 2.2 2.3   Liver Function Tests: Recent Labs  Lab 07/10/21 0506 07/11/21 0530 07/12/21 0616 07/13/21 0645  AST 15 13* 11* 10*  ALT 17 14 12 13   ALKPHOS 67 66 57 54  BILITOT 0.7 0.5  0.5 0.6  PROT 8.2* 7.6 7.2 7.3  ALBUMIN 3.5 3.4* 3.3* 3.3*   BNP (last 3 results) Recent Labs    10/31/20 1024 07/09/21 1154 07/09/21 1456  BNP 27.0 10.0 11.0   Cardiac Enzymes: No results for input(s): CKTOTAL, CKMB, CKMBINDEX, TROPONINI in the last 168 hours. CBG: No results for input(s): GLUCAP in the last 168 hours. Hgb A1c No results for input(s): HGBA1C in the last 72 hours. Lipid Profile No results for input(s): CHOL, HDL, LDLCALC, TRIG, CHOLHDL, LDLDIRECT in the last 72 hours. Thyroid function studies No results for input(s): TSH, T4TOTAL, T3FREE, THYROIDAB in the last 72 hours.  Invalid input(s): FREET3 Anemia work up Recent Labs    07/11/21 0530 07/12/21 0616  FERRITIN 54 51   Urinalysis    Component Value Date/Time   COLORURINE YELLOW 03/12/2018 1750   APPEARANCEUR CLEAR 03/12/2018 1750   LABSPEC 1.020 03/12/2018 1750   PHURINE 5.0 03/12/2018 1750   GLUCOSEU NEGATIVE 03/12/2018 1750   HGBUR SMALL (A) 03/12/2018 1750   BILIRUBINUR NEGATIVE 03/12/2018 1750   KETONESUR NEGATIVE 03/12/2018 1750   PROTEINUR NEGATIVE 03/12/2018 1750   NITRITE NEGATIVE 03/12/2018 1750   LEUKOCYTESUR NEGATIVE 03/12/2018 1750         Time coordinating discharge: Over 45 minutes  SIGNED: 03/14/2018, MD, FACP, FHM. [facility],  Please use [email or web address] to Page If 7PM-7AM, please contact night-coverage [email or web address] 07/13/2021, 7:53 AM

## 2021-07-13 NOTE — Progress Notes (Signed)
Pt discharged via WC to POV with home O2 on and all personal belongings in her mother's possession.

## 2022-05-18 ENCOUNTER — Other Ambulatory Visit: Payer: Self-pay | Admitting: Gastroenterology

## 2022-05-18 DIAGNOSIS — R1319 Other dysphagia: Secondary | ICD-10-CM

## 2022-05-21 ENCOUNTER — Encounter (HOSPITAL_COMMUNITY): Payer: Self-pay | Admitting: Gastroenterology

## 2022-05-21 ENCOUNTER — Ambulatory Visit
Admission: RE | Admit: 2022-05-21 | Discharge: 2022-05-21 | Disposition: A | Discharge status: Home / Self Care | Source: Ambulatory Visit | Attending: Gastroenterology | Admitting: Gastroenterology

## 2022-05-21 ENCOUNTER — Other Ambulatory Visit: Payer: Self-pay | Admitting: Gastroenterology

## 2022-05-21 DIAGNOSIS — R1319 Other dysphagia: Secondary | ICD-10-CM

## 2022-05-31 NOTE — H&P (Signed)
History of Present Illness  General:          51 year old female referred for chronic abdominal pain and change in bowel habits        labs on epic, 07/13/21: BUN 40, creatinine 1.04, TB/AST/ALT/ALP of 0.05/29/12/54, normal ferritin, normal CRP, hemoglobin 12.7, MCV 13.7, platelet 218        She reports battling diarrheal episodes for years, she can control it if she stops eating regular food and stay on mashed pototoes only, but this has not worked recently.        She has to stop eating for a few days and drinks only gatorade for a few days to control diarrhea and thereafter she has hard stools and needs to take colace to sodten her stools, so she struggles between diarrhea and hard stools intermittently.        She cannot eat fried food, nuts, beans.        She has noted blood in stool and on wioing, it is intermittent, occurs at times for 1-2 weeks, bright red in color, at times covers the entire toilet bowl.        Stool can be bright yellow in color and has a very foul odor.        She was on antibiotics for her tooth removal 2 months, her nephew was diagnosed with Crohns disease at 33, her mother and her 2 older sisters have diarrhea like her-no diagnosis.        She gets hot and flushed when she has diarrhea.        She also has mid abdominal pain with diarrhea.        She has never had a colonoscopy.        She has gastric stapling in 2000 at [facility] for weight loss, she has daily episodes of vomiting at food(meat, bread) ,solids and liquids, get stuck in her chest, non progressive, not associated with weight loss, it causes "crumpling" of her teeth and hair loss.        Last EGD was post surgery many years ago.        She has acid reflux and heartburn, she takes omeprazole for over 20 years.        She was 400lbs and has lsot weight as she stopped eating, as she felt that eating was not worth the pain or diarrhea.        She has pain on wiping her buttoc, sitting hurts.        Her  paternal uncle was diagnosed with colon cancer stage 4 in his 62s.     Current Medications  Taking  Colace Adult(Glycerin (Laxative)) , Notes: unknown dosage    Albuterol Sulfate 108 (90 Base) MCG/ACT Aerosol Powder Breath Activated 1 puff as needed Inhalation every 4 hrs    omeprazole (Omeprazole) 2 mg/mL liquid 0.5 ml orally twice a day    Lasix(Furosemide) 20 MG Tablet 1 tablet Orally Once a day    Losartan Potassium-HCTZ 50-12.5 MG Tablet 1 tablet Orally Once a day    Medication List reviewed and reconciled with the patient         Past Medical History        High blood pressure.         CHF.         GERD.         Asthma.         Sleep apnea.  Surgical History         gall bladder removed          gastroplasty          hysterectomy          wisdom teeth        Family History   no family hx of GI issues.       Social History  General:   Tobacco use       cigarettes:  Never smoked     Tobacco history last updated  05/18/2022 no Alcohol.  no Recreational drug use.      Allergies   N.K.D.A.       Hospitalization/Major Diagnostic Procedure   not in the past year 04/2022       Review of Systems  GI PROCEDURE:          Pacemaker/ AICD no.  Artificial heart valves no.  MI/heart attack no.  Abnormal heart rhythm no.  Angina no.  CVA no.  Hypertension YES.  Hypotension no.  Asthma, COPD YES asthma.  Sleep apnea YES , using bypap.  Seizure disorders no.  Artificial joints no.  Severe DJD both knees.  Diabetes no.  Significant headaches no.  Vertigo no.  Depression/anxiety YES.  Abnormal bleeding no.  Kidney Disease no.  Liver disease no.  Chance of pregnancy no.  Blood transfusion no.       Vital Signs  Wt 375.0, Ht 68, BMI 57.01, Temp 97.9, Pulse sitting 58, BP sitting 111/87 160/127 first bp.     Examination  Gastroenterology::        GENERAL APPEARANCE: Well developed, obese, no active distress, pleasant.          SCLERA: anicteric.         CARDIOVASCULAR Normal RRR .         RESPIRATORY Breath sounds normal. Respiration even and unlabored.         ABDOMEN No masses palpated. Liver and spleen not palpated, normal. Bowel sounds normal, Abdomen not distended.         EXTREMITIES: No edema.         NEURO: alert, oriented to time, place and person, normal gait.         PSYCH: mood/affect normal.       Assessments     1. Chronic diarrhea - K52.9 (Primary)   2. Diarrhea of presumed infectious origin - R19.7   3. Family history of colon cancer - Z80.0   4. Esophageal dysphagia - R13.19   5. Morbidly obese - E66.01   6. Rectal bleeding - K62.5     Treatment   1. Chronic diarrhea         LAB: Calprotectin, Fecal [id number])       IMAGING: Colonoscopy              Weston Brass 05/18/2022 10:38:09 AM > Pt scheduled at [facility] 6/13, spoke to [employer]. Instructions and rx given to pt   Notes: DDX: IBD, IBS, infectious. Will proceed with stool studies, colonsocopy at the hospital as she is morbidly obese.        2. Diarrhea of presumed infectious origin         LAB: Calprotectin, Fecal [id number])       LAB: GI Pathogen Panel, PCR, Stool [id number]) Notes: DDX: IBD, IBS, infectious. Will proceed with stool studies, colonsocopy at the hospital as she is morbidly obese.      3. Family history of colon  cancer         IMAGING: Colonoscopy              Weston Brass 05/18/2022 10:38:09 AM > Pt scheduled at [facility] 6/13, spoke to [employer]. Instructions and rx given to pt   Notes: WIll need to be done at [facility].      4. Esophageal dysphagia         IMAGING: Barium : Swallow with pill              Weston Brass 05/18/2022 10:39:27 AM > Pt scheduled at [facility] 6/2. Faxed order   Notes: This is not associated with weight loss or early satiety, is non progressive,will proceed with barium swallow for further evaluation.      5. Rectal bleeding         IMAGING: Colonoscopy              Weston Brass 05/18/2022 10:38:09  AM > Pt scheduled at [facility] 6/13, spoke to [employer]. Instructions and rx given to pt   Notes: DDX: colitis, hemorrhoidal bleeding, AVM, bleeding polyp or colonic lesion.

## 2022-06-01 ENCOUNTER — Encounter (HOSPITAL_COMMUNITY)
Admission: RE | Disposition: A | Discharge status: Home / Self Care | Payer: Self-pay | Source: Ambulatory Visit | Attending: Gastroenterology

## 2022-06-01 ENCOUNTER — Ambulatory Visit (HOSPITAL_COMMUNITY): Admitting: Certified Registered"

## 2022-06-01 ENCOUNTER — Ambulatory Visit (HOSPITAL_COMMUNITY)
Admission: RE | Admit: 2022-06-01 | Discharge: 2022-06-01 | Disposition: A | Discharge status: Home / Self Care | Source: Ambulatory Visit | Attending: Gastroenterology | Admitting: Gastroenterology

## 2022-06-01 ENCOUNTER — Ambulatory Visit (HOSPITAL_BASED_OUTPATIENT_CLINIC_OR_DEPARTMENT_OTHER): Admitting: Certified Registered"

## 2022-06-01 ENCOUNTER — Encounter (HOSPITAL_COMMUNITY): Payer: Self-pay | Admitting: Gastroenterology

## 2022-06-01 ENCOUNTER — Other Ambulatory Visit: Payer: Self-pay

## 2022-06-01 DIAGNOSIS — I10 Essential (primary) hypertension: Secondary | ICD-10-CM | POA: Insufficient documentation

## 2022-06-01 DIAGNOSIS — G8929 Other chronic pain: Secondary | ICD-10-CM | POA: Insufficient documentation

## 2022-06-01 DIAGNOSIS — R1314 Dysphagia, pharyngoesophageal phase: Secondary | ICD-10-CM | POA: Insufficient documentation

## 2022-06-01 DIAGNOSIS — Z800 Family history of malignant neoplasm of digestive organs: Secondary | ICD-10-CM | POA: Diagnosis not present

## 2022-06-01 DIAGNOSIS — K635 Polyp of colon: Secondary | ICD-10-CM | POA: Diagnosis not present

## 2022-06-01 DIAGNOSIS — K319 Disease of stomach and duodenum, unspecified: Secondary | ICD-10-CM | POA: Insufficient documentation

## 2022-06-01 DIAGNOSIS — K219 Gastro-esophageal reflux disease without esophagitis: Secondary | ICD-10-CM | POA: Diagnosis not present

## 2022-06-01 DIAGNOSIS — K6289 Other specified diseases of anus and rectum: Secondary | ICD-10-CM | POA: Diagnosis not present

## 2022-06-01 DIAGNOSIS — K648 Other hemorrhoids: Secondary | ICD-10-CM | POA: Insufficient documentation

## 2022-06-01 DIAGNOSIS — R1084 Generalized abdominal pain: Secondary | ICD-10-CM | POA: Diagnosis not present

## 2022-06-01 DIAGNOSIS — Z9884 Bariatric surgery status: Secondary | ICD-10-CM | POA: Insufficient documentation

## 2022-06-01 DIAGNOSIS — G4733 Obstructive sleep apnea (adult) (pediatric): Secondary | ICD-10-CM | POA: Diagnosis not present

## 2022-06-01 DIAGNOSIS — R197 Diarrhea, unspecified: Secondary | ICD-10-CM | POA: Diagnosis not present

## 2022-06-01 DIAGNOSIS — Z6841 Body Mass Index (BMI) 40.0 and over, adult: Secondary | ICD-10-CM | POA: Insufficient documentation

## 2022-06-01 DIAGNOSIS — K529 Noninfective gastroenteritis and colitis, unspecified: Secondary | ICD-10-CM | POA: Diagnosis not present

## 2022-06-01 DIAGNOSIS — K644 Residual hemorrhoidal skin tags: Secondary | ICD-10-CM | POA: Insufficient documentation

## 2022-06-01 DIAGNOSIS — K2289 Other specified disease of esophagus: Secondary | ICD-10-CM | POA: Diagnosis not present

## 2022-06-01 DIAGNOSIS — Z79899 Other long term (current) drug therapy: Secondary | ICD-10-CM | POA: Insufficient documentation

## 2022-06-01 DIAGNOSIS — G4730 Sleep apnea, unspecified: Secondary | ICD-10-CM | POA: Diagnosis not present

## 2022-06-01 HISTORY — PX: ESOPHAGOGASTRODUODENOSCOPY (EGD) WITH PROPOFOL: SHX5813

## 2022-06-01 HISTORY — PX: COLONOSCOPY WITH PROPOFOL: SHX5780

## 2022-06-01 HISTORY — PX: BIOPSY: SHX5522

## 2022-06-01 HISTORY — PX: POLYPECTOMY: SHX5525

## 2022-06-01 SURGERY — COLONOSCOPY WITH PROPOFOL
Anesthesia: Monitor Anesthesia Care

## 2022-06-01 MED ORDER — PROPOFOL 500 MG/50ML IV EMUL
INTRAVENOUS | Status: DC | PRN
  Administered 2022-06-01: 150 ug/kg/min via INTRAVENOUS

## 2022-06-01 MED ORDER — LIDOCAINE HCL (PF) 2 % IJ SOLN
INTRAMUSCULAR | Status: DC | PRN
  Administered 2022-06-01: 100 mg via INTRADERMAL

## 2022-06-01 MED ORDER — PROPOFOL 10 MG/ML IV BOLUS
INTRAVENOUS | Status: AC
  Filled 2022-06-01: qty 20

## 2022-06-01 MED ORDER — PROPOFOL 1000 MG/100ML IV EMUL
INTRAVENOUS | Status: AC
  Filled 2022-06-01: qty 100

## 2022-06-01 MED ORDER — KETAMINE HCL 10 MG/ML IJ SOLN
INTRAMUSCULAR | Status: AC
  Filled 2022-06-01: qty 1

## 2022-06-01 MED ORDER — SODIUM CHLORIDE 0.9 % IV SOLN: INTRAVENOUS | Status: DC

## 2022-06-01 MED ORDER — LIDOCAINE 2% (20 MG/ML) 5 ML SYRINGE
INTRAMUSCULAR | Status: DC | PRN
  Administered 2022-06-01: 100 mg via INTRAVENOUS

## 2022-06-01 MED ORDER — GLYCOPYRROLATE 0.2 MG/ML IJ SOLN
INTRAMUSCULAR | Status: DC | PRN
  Administered 2022-06-01: .2 mg via INTRAVENOUS

## 2022-06-01 MED ORDER — LACTATED RINGERS IV SOLN: INTRAVENOUS | Status: DC

## 2022-06-01 MED ORDER — KETAMINE HCL 10 MG/ML IJ SOLN
INTRAMUSCULAR | Status: DC | PRN
  Administered 2022-06-01: 50 mg via INTRAVENOUS

## 2022-06-01 MED ORDER — PROPOFOL 10 MG/ML IV BOLUS
INTRAVENOUS | Status: DC | PRN
  Administered 2022-06-01: 20 mg via INTRAVENOUS

## 2022-06-01 SURGICAL SUPPLY — 25 items

## 2022-06-01 NOTE — Op Note (Signed)
[facility] Patient Name: Ann Giles Procedure Date: 06/01/2022 MRN: [id number] Attending MD: Ronnette Juniper , MD Date of Birth: Apr 20, 1971 CSN: [id number] Age: 51 Admit Type: Outpatient Procedure:                Colonoscopy Indications:              This is the patient's first colonoscopy,                            Generalized abdominal pain, Clinically significant                            diarrhea of unexplained origin, Rectal bleeding,                            Family history of colon cancer in a distant relative Providers:                Ronnette Juniper, MD, Jaci Carrel, RN, William Dalton, Technician, Darliss Cheney, Technician Referring MD:             Zadie Cleverly Reese,MD Medicines:                Monitored Anesthesia Care Complications:            No immediate complications. Estimated blood loss:                            Minimal. Estimated Blood Loss:     Estimated blood loss was minimal. Procedure:                Pre-Anesthesia Assessment:                           - Prior to the procedure, a History and Physical                            was performed, and patient medications and                            allergies were reviewed. The patient's tolerance of                            previous anesthesia was also reviewed. The risks                            and benefits of the procedure and the sedation                            options and risks were discussed with the patient.                            All questions were answered, and informed consent  was obtained. Prior Anticoagulants: The patient has                            taken no previous anticoagulant or antiplatelet                            agents. ASA Grade Assessment: IV - A patient with                            severe systemic disease that is a constant threat                            to life. After reviewing the risks and  benefits,                            the patient was deemed in satisfactory condition to                            undergo the procedure.                           - Prior to the procedure, a History and Physical                            was performed, and patient medications and                            allergies were reviewed. The patient's tolerance of                            previous anesthesia was also reviewed. The risks                            and benefits of the procedure and the sedation                            options and risks were discussed with the patient.                            All questions were answered, and informed consent                            was obtained. Prior Anticoagulants: The patient has                            taken no previous anticoagulant or antiplatelet                            agents. ASA Grade Assessment: IV - A patient with                            severe systemic disease that is a constant threat  to life. After reviewing the risks and benefits,                            the patient was deemed in satisfactory condition to                            undergo the procedure.                           After obtaining informed consent, the colonoscope                            was passed under direct vision. Throughout the                            procedure, the patient's blood pressure, pulse, and                            oxygen saturations were monitored continuously. The                            [id number] Olympus colonoscope was                            introduced through the anus and advanced to the the                            terminal ileum. The colonoscopy was performed                            without difficulty. The patient tolerated the                            procedure well. The quality of the bowel                            preparation was poor. Scope In: 12:03:01  PM Scope Out: 12:14:37 PM Scope Withdrawal Time: 0 hours 9 minutes 28 seconds  Total Procedure Duration: 0 hours 11 minutes 36 seconds  Findings:      Hemorrhoids were found on perianal exam.      Self limited oozing was noted in the perianal area prior to passage of       colonoscope, likely related to hemorrhoids.      The terminal ileum appeared normal.      A large amount of liquid, semi-liquid, semi-solid and some stool was       found in the entire colon, interfering with visualization. Lavage of the       area was performed, resulting in incomplete clearance with fair       visualization.      Biopsies for histology were taken with a cold forceps for evaluation of       microscopic colitis.      A 4 mm polyp was found in the descending colon. The polyp was sessile.       The polyp was removed with a piecemeal technique using a cold  biopsy       forceps. Resection and retrieval were complete.      Anal papilla(e) were hypertrophied.      Bleeding external internal hemorrhoids were found during retroflexion,       during perianal exam and during endoscopy. Impression:               - Preparation of the colon was poor.                           - Hemorrhoids found on perianal exam.                           - The examined portion of the ileum was normal.                           - Stool in the entire examined colon.                           - One 4 mm polyp in the descending colon, removed                            piecemeal using a cold biopsy forceps. Resected and                            retrieved.                           - Anal papilla(e) were hypertrophied.                           - Bleeding external internal hemorrhoids.                           - Biopsies were taken with a cold forceps for                            evaluation of microscopic colitis. Moderate Sedation:      Patient did not receive moderate sedation for this procedure, but       instead received  monitored anesthesia care. Recommendation:           - Patient has a contact number available for                            emergencies. The signs and symptoms of potential                            delayed complications were discussed with the                            patient. Return to normal activities tomorrow.                            Written discharge instructions were provided to the  patient.                           - Resume regular diet.                           - Continue present medications.                           - Await pathology results.                           - Repeat colonoscopy for surveillance based on                            pathology results. Procedure Code(s):        --- Professional ---                           [phone], Colonoscopy, flexible; with biopsy, single                            or multiple Diagnosis Code(s):        --- Professional ---                           K64.4, Residual hemorrhoidal skin tags                           K64.8, Other hemorrhoids                           K63.5, Polyp of colon                           K62.89, Other specified diseases of anus and rectum                           R10.84, Generalized abdominal pain                           R19.7, Diarrhea, unspecified                           K62.5, Hemorrhage of anus and rectum                           Z80.0, Family history of malignant neoplasm of                            digestive organs CPT copyright 2019 [employer]. All rights reserved. The codes documented in this report are preliminary and upon coder review may  be revised to meet current compliance requirements. Ronnette Juniper, MD 06/01/2022 12:26:45 PM This report has been signed electronically. Number of Addenda: 0

## 2022-06-01 NOTE — Transfer of Care (Signed)
Immediate Anesthesia Transfer of Care Note  Patient: Ann Giles  Procedure(s) Performed: COLONOSCOPY WITH PROPOFOL ESOPHAGOGASTRODUODENOSCOPY (EGD) WITH PROPOFOL BIOPSY POLYPECTOMY  Patient Location: PACU and Endoscopy Unit  Anesthesia Type:MAC  Level of Consciousness: awake, drowsy and patient cooperative  Airway & Oxygen Therapy: Patient Spontanous Breathing and Patient connected to face mask oxygen  Post-op Assessment: Report given to RN and Post -op Vital signs reviewed and stable  Post vital signs: Reviewed and stable  Last Vitals:  Vitals Value Taken Time  BP    Temp    Pulse    Resp    SpO2      Last Pain:  Vitals:   06/01/22 1118  TempSrc: Temporal  PainSc: 0-No pain         Complications: No notable events documented.

## 2022-06-01 NOTE — Op Note (Signed)
[facility] Patient Name: Ann Giles Procedure Date: 06/01/2022 MRN: [id number] Attending MD: Ronnette Juniper , MD Date of Birth: Sep 08, 1971 CSN: [id number] Age: 51 Admit Type: Outpatient Procedure:                Upper GI endoscopy Indications:              Dysphagia, Diarrhea, abdominal pain,acid reflux and                            heartburn Providers:                Ronnette Juniper, MD, Jaci Carrel, RN, William Dalton, Technician, Darliss Cheney, Technician Referring MD:             Zadie Cleverly Reese,MD Medicines:                Monitored Anesthesia Care Complications:            No immediate complications. Estimated blood loss:                            Minimal. Estimated Blood Loss:     Estimated blood loss was minimal. Procedure:                Pre-Anesthesia Assessment:                           - Prior to the procedure, a History and Physical                            was performed, and patient medications and                            allergies were reviewed. The patient's tolerance of                            previous anesthesia was also reviewed. The risks                            and benefits of the procedure and the sedation                            options and risks were discussed with the patient.                            All questions were answered, and informed consent                            was obtained. Prior Anticoagulants: The patient has                            taken no previous anticoagulant or antiplatelet                            agents. ASA Grade  Assessment: IV - A patient with                            severe systemic disease that is a constant threat                            to life. After reviewing the risks and benefits,                            the patient was deemed in satisfactory condition to                            undergo the procedure.                           After obtaining  informed consent, the endoscope was                            passed under direct vision. Throughout the                            procedure, the patient's blood pressure, pulse, and                            oxygen saturations were monitored continuously. The                            [id number] Olympus endoscope was introduced                            through the mouth, and advanced to the second part                            of duodenum. The upper GI endoscopy was                            accomplished without difficulty. The patient                            tolerated the procedure well. Scope In: Scope Out: Findings:      No endoscopic abnormality was evident in the esophagus to explain the       patient's complaint of dysphagia. Biopsies were obtained from the       proximal and distal esophagus with cold forceps for histology of       suspected eosinophilic esophagitis.      The Z-line was regular and was found 35 cm from the incisors.      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 This was       characterized by healthy appearing mucosa. An area of narrowing was       noted at the site of gastric surgery.      Mildly erythematous mucosa without bleeding was found in the cardia, in       the gastric fundus, in the gastric body and in the gastric antrum.  Biopsies were taken with a cold forceps for Helicobacter pylori testing.      The examined duodenum was normal. Biopsies for histology were taken with       a cold forceps for evaluation of celiac disease. Impression:               - No endoscopic esophageal abnormality to explain                            patient's dysphagia. Biopsied.                           - Z-line regular, 35 cm from the incisors.                           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                           - Erythematous mucosa in the  cardia, gastric                            fundus, gastric body and antrum. Biopsied.                           - Normal examined duodenum. Biopsied. Moderate Sedation:      Patient did not receive moderate sedation for this procedure, but       instead received monitored anesthesia care. Recommendation:           - Patient has a contact number available for                            emergencies. The signs and symptoms of potential                            delayed complications were discussed with the                            patient. Return to normal activities tomorrow.                            Written discharge instructions were provided to the                            patient.                           - Resume regular diet.                           - Continue present medications.                           - Await pathology results. Procedure Code(s):        --- Professional ---  [id number], Esophagogastroduodenoscopy, flexible,                            transoral; with biopsy, single or multiple Diagnosis Code(s):        --- Professional ---                           R13.10, Dysphagia, unspecified                           K31.89, Other diseases of stomach and duodenum                           R19.7, Diarrhea, unspecified CPT copyright 2019 [employer]. All rights reserved. The codes documented in this report are preliminary and upon coder review may  be revised to meet current compliance requirements. Ronnette Juniper, MD 06/01/2022 12:22:34 PM This report has been signed electronically. Number of Addenda: 0

## 2022-06-01 NOTE — Anesthesia Preprocedure Evaluation (Signed)
Anesthesia Evaluation  Patient identified by MRN, date of birth, ID band Patient awake    Reviewed: Allergy & Precautions, NPO status , Patient's Chart, lab work & pertinent test results  Airway Mallampati: II  TM Distance: >3 FB Neck ROM: Full    Dental no notable dental hx.    Pulmonary sleep apnea and Continuous Positive Airway Pressure Ventilation ,    breath sounds clear to auscultation + decreased breath sounds      Cardiovascular hypertension, Pt. on medications Normal cardiovascular exam Rhythm:Regular Rate:Normal     Neuro/Psych negative neurological ROS  negative psych ROS   GI/Hepatic negative GI ROS, Neg liver ROS,   Endo/Other  Morbid obesity  Renal/GU negative Renal ROS  negative genitourinary   Musculoskeletal negative musculoskeletal ROS (+)   Abdominal (+) + obese,   Peds negative pediatric ROS (+)  Hematology negative hematology ROS (+)   Anesthesia Other Findings   Reproductive/Obstetrics negative OB ROS                             Anesthesia Physical Anesthesia Plan  ASA: 4  Anesthesia Plan: MAC   Post-op Pain Management: Minimal or no pain anticipated   Induction: Intravenous  PONV Risk Score and Plan: 2 and Propofol infusion and Treatment may vary due to age or medical condition  Airway Management Planned: Simple Face Mask  Additional Equipment:   Intra-op Plan:   Post-operative Plan:   Informed Consent: I have reviewed the patients History and Physical, chart, labs and discussed the procedure including the risks, benefits and alternatives for the proposed anesthesia with the patient or authorized representative who has indicated his/her understanding and acceptance.     Dental advisory given  Plan Discussed with: CRNA and Surgeon  Anesthesia Plan Comments:         Anesthesia Quick Evaluation

## 2022-06-01 NOTE — Discharge Instructions (Signed)

## 2022-06-01 NOTE — Anesthesia Procedure Notes (Signed)
Procedure Name: MAC Date/Time: 06/01/2022 11:43 AM  Performed by: Eben Burow, CRNAPre-anesthesia Checklist: Patient identified, Emergency Drugs available, Suction available, Patient being monitored and Timeout performed Oxygen Delivery Method: Simple face mask Placement Confirmation: positive ETCO2

## 2022-06-02 NOTE — Anesthesia Postprocedure Evaluation (Signed)
Anesthesia Post Note  Patient: Ann Giles  Procedure(s) Performed: COLONOSCOPY WITH PROPOFOL ESOPHAGOGASTRODUODENOSCOPY (EGD) WITH PROPOFOL BIOPSY POLYPECTOMY     Patient location during evaluation: PACU Anesthesia Type: MAC Level of consciousness: awake and alert Pain management: pain level controlled Vital Signs Assessment: post-procedure vital signs reviewed and stable Respiratory status: spontaneous breathing, nonlabored ventilation, respiratory function stable and patient connected to nasal cannula oxygen Cardiovascular status: stable and blood pressure returned to baseline Postop Assessment: no apparent nausea or vomiting Anesthetic complications: no   No notable events documented.  Last Vitals:  Vitals:   06/01/22 1300 06/01/22 1301  BP: (!) 178/95 (!) 178/95  Pulse: 65 64  Resp: 15 11  Temp:    SpO2: 93% 96%    Last Pain:  Vitals:   06/01/22 1300  TempSrc:   PainSc: 0-No pain                 Wilbert Hayashi S

## 2022-06-03 LAB — SURGICAL PATHOLOGY

## 2022-06-06 ENCOUNTER — Encounter (HOSPITAL_COMMUNITY): Payer: Self-pay | Admitting: Gastroenterology

## 2022-09-18 ENCOUNTER — Ambulatory Visit
Admission: EM | Admit: 2022-09-18 | Discharge: 2022-09-18 | Disposition: A | Discharge status: Home / Self Care | Attending: Family Medicine | Admitting: Family Medicine

## 2022-09-18 ENCOUNTER — Encounter: Payer: Self-pay | Admitting: Emergency Medicine

## 2022-09-18 DIAGNOSIS — M79671 Pain in right foot: Secondary | ICD-10-CM | POA: Diagnosis not present

## 2022-09-18 MED ORDER — PREDNISONE 20 MG PO TABS: 40.0000 mg | ORAL_TABLET | Freq: Every day | ORAL | 0 refills | Status: DC

## 2022-09-18 MED ORDER — KETOROLAC TROMETHAMINE 30 MG/ML IJ SOLN
30.0000 mg | Freq: Once | INTRAMUSCULAR | Status: AC
  Administered 2022-09-18: 30 mg via INTRAMUSCULAR

## 2022-09-18 NOTE — ED Provider Notes (Signed)
[facility]    CSN: [id number] Arrival date & time: 09/18/22  1456      History   Chief Complaint No chief complaint on file.   HPI Ann Giles is a 51 y.o. female.   Patient presenting today with 2-day history of progressively worsening pain at the base of her right great toe.  Has noted swelling, slight discoloration to the area and states she is unable to bend the toe or bear weight on this area.  Even her bedsheet hurts when touching the area.  Denies any injury that she is aware of, fevers, chills, numbness, tingling.  Over-the-counter pain relievers not providing any relief.    Past Medical History:  Diagnosis Date   Arthritis    Asthma    Bronchitis    CHF (congestive heart failure) (HCC)    Depression    Hypertension    OSA (obstructive sleep apnea)     Patient Active Problem List   Diagnosis Date Noted   COVID-19 virus infection 07/09/2021   Hypoxia    Obesity hypoventilation syndrome (HCC)    Gastroesophageal reflux disease    OSA treated with BiPAP 10/31/2020   Community acquired pneumonia    Acute on chronic diastolic CHF (congestive heart failure) (HCC) 06/23/2019   Acute diastolic CHF (congestive heart failure) (HCC) 12/23/2017   Obesity, Class III, BMI 40-49.9 (morbid obesity) (HCC) 12/23/2017   Acute bronchospasm 09/09/2016   Asthma 09/09/2016   Hypertension 09/09/2016   Depression 09/09/2016   Leg swelling 09/09/2016   Acute respiratory failure with hypoxia (HCC) 09/09/2016   Bronchospasm, acute 09/09/2016    Past Surgical History:  Procedure Laterality Date   ABDOMINAL HYSTERECTOMY     BIOPSY  06/01/2022   Procedure: BIOPSY;  Surgeon: Kerin Salen, MD;  Location: [facility];  Service: Gastroenterology;;   CHOLECYSTECTOMY     COLONOSCOPY WITH PROPOFOL N/A 06/01/2022   Procedure: COLONOSCOPY WITH PROPOFOL;  Surgeon: Kerin Salen, MD;  Location: [facility];  Service: Gastroenterology;  Laterality: N/A;    ESOPHAGOGASTRODUODENOSCOPY (EGD) WITH PROPOFOL N/A 06/01/2022   Procedure: ESOPHAGOGASTRODUODENOSCOPY (EGD) WITH PROPOFOL;  Surgeon: Kerin Salen, MD;  Location: [facility];  Service: Gastroenterology;  Laterality: N/A;   GASTROPLASTY     POLYPECTOMY  06/01/2022   Procedure: POLYPECTOMY;  Surgeon: Kerin Salen, MD;  Location: [facility];  Service: Gastroenterology;;    OB History   No obstetric history on file.      Home Medications    Prior to Admission medications   Medication Sig Start Date End Date Taking? Authorizing Provider  predniSONE (DELTASONE) 20 MG tablet Take 2 tablets (40 mg total) by mouth daily with breakfast. 09/18/22  Yes Particia Nearing, PA-C  albuterol (PROVENTIL HFA;VENTOLIN HFA) 108 (90 Base) MCG/ACT inhaler Inhale 2 puffs into the lungs every 6 (six) hours as needed for wheezing or shortness of breath.    [provider]  diphenhydrAMINE (BENADRYL) 25 MG tablet Take 25 mg by mouth every 6 (six) hours as needed for allergies.    [provider]  furosemide (LASIX) 40 MG tablet Take 1 tablet (40 mg total) by mouth 2 (two) times daily. May take additional 20 mg for swelling Daily Patient taking differently: Take 40 mg by mouth daily. May take additional 20 mg for swelling Daily 11/04/20 05/28/22  Vassie Loll, MD  Ipratropium-Albuterol (COMBIVENT) 20-100 MCG/ACT AERS respimat Inhale 1 puff into the lungs every 6 (six) hours as needed for wheezing. Patient taking differently: Inhale 1 puff into  the lungs every 6 (six) hours as needed (Bronchitis). 07/13/21 05/28/22  Shahmehdi, Gemma Payor, MD  losartan (COZAAR) 25 MG tablet Take 1 tablet (25 mg total) by mouth daily. 11/04/20   Vassie Loll, MD  omeprazole (PRILOSEC) 40 MG capsule Take 1 capsule (40 mg total) by mouth daily. Patient taking differently: Take 20 mg by mouth daily as needed (GERD). 07/13/21   Kendell Bane, MD  OXYGEN Inhale 2 L into the lungs See admin instructions. Uses as needed  during the day and nightly on schedule with BIPAP    [provider]  phenylephrine-shark liver oil-mineral oil-petrolatum (PREPARATION H) 0.25-14-74.9 % rectal ointment Place 1 application rectally 2 (two) times daily as needed for hemorrhoids.    [provider]  sertraline (ZOLOFT) 100 MG tablet Take 1 tablet (100 mg total) by mouth daily. Patient not taking: Reported on 05/28/2022 07/13/21   Kendell Bane, MD  Zinc Oxide (DESITIN) 13 % CREA Apply 1 application  topically daily as needed (Helps with diarrhea irritation).    [provider]    Family History Family History  Problem Relation Age of Onset   Asthma Neg Hx     Social History Social History   Tobacco Use   Smoking status: Never   Smokeless tobacco: Never  Substance Use Topics   Alcohol use: No   Drug use: No     Allergies   Patient has no known allergies.   Review of Systems Review of Systems Per HPI  Physical Exam Triage Vital Signs ED Triage Vitals  Enc Vitals Group     BP 09/18/22 1503 (!) 188/111     Pulse Rate 09/18/22 1503 78     Resp 09/18/22 1503 18     Temp 09/18/22 1503 (!) 97.4 F (36.3 C)     Temp Source 09/18/22 1503 Oral     SpO2 09/18/22 1503 92 %     Weight --      Height --      Head Circumference --      Peak Flow --      Pain Score 09/18/22 1504 8     Pain Loc --      Pain Edu? --      Excl. in GC? --    No data found.  Updated Vital Signs BP (!) 166/109 (BP Location: Right Arm)   Pulse 78   Temp (!) 97.4 F (36.3 C) (Oral)   Resp 18   SpO2 92%   Visual Acuity Right Eye Distance:   Left Eye Distance:   Bilateral Distance:    Right Eye Near:   Left Eye Near:    Bilateral Near:     Physical Exam Vitals and nursing note reviewed.  Constitutional:      Appearance: Normal appearance. She is not ill-appearing.  HENT:     Head: Atraumatic.  Eyes:     Extraocular Movements: Extraocular movements intact.     Conjunctiva/sclera:  Conjunctivae normal.  Cardiovascular:     Rate and Rhythm: Normal rate and regular rhythm.     Heart sounds: Normal heart sounds.  Pulmonary:     Effort: Pulmonary effort is normal.     Breath sounds: Normal breath sounds.  Musculoskeletal:     Cervical back: Normal range of motion and neck supple.     Comments: Using a postop shoe to the right foot due to pain in the right great toe.  Range of motion is intact in this  area but painful.  Significant tenderness to palpation to the base of the right great toe.  Edema, warmth noted to the area  Skin:    General: Skin is warm and dry.  Neurological:     Mental Status: She is alert and oriented to person, place, and time.     Comments: Right foot neurovascularly intact  Psychiatric:        Mood and Affect: Mood normal.        Thought Content: Thought content normal.        Judgment: Judgment normal.    UC Treatments / Results  Labs (all labs ordered are listed, but only abnormal results are displayed) Labs Reviewed  CBC WITH DIFFERENTIAL/PLATELET  URIC ACID  BASIC METABOLIC PANEL    EKG   Radiology No results found.  Procedures Procedures (including critical care time)  Medications Ordered in UC Medications  ketorolac (TORADOL) 30 MG/ML injection 30 mg (30 mg Intramuscular Given 09/18/22 1548)    Initial Impression / Assessment and Plan / UC Course  I have reviewed the triage vital signs and the nursing notes.  Pertinent labs & imaging results that were available during my care of the patient were reviewed by me and considered in my medical decision making (see chart for details).     Hypertensive in triage, she states she threw up her blood pressure medication this morning due to the extent of the pain.  Otherwise vital signs reassuring.  Suspect gout to be causing her great toe pain, labs pending for further rule out and possible confirmation, IM Toradol given in clinic for immediate pain and inflammation and a course  of prednisone sent to pharmacy.  Discussed supportive over-the-counter measures and home care additionally.  Close follow-up for worsening symptoms.  Final Clinical Impressions(s) / UC Diagnoses   Final diagnoses:  Right foot pain   Discharge Instructions   None    ED Prescriptions     Medication Sig Dispense Auth. Provider   predniSONE (DELTASONE) 20 MG tablet Take 2 tablets (40 mg total) by mouth daily with breakfast. 10 tablet Volney American, [place]      PDMP not reviewed this encounter.   Volney American, [place] 09/18/22 [phone]

## 2022-09-18 NOTE — ED Triage Notes (Signed)
Right foot pain.  No known injury.  Pain started at back of great toe and spread to side of foot, then to top of foot.  Hurts to put pressure on foot.  Pain has gradually become worse. States she has a history of spider bite that started this same way about 10 years ago.

## 2022-09-19 LAB — CBC WITH DIFFERENTIAL/PLATELET
Basophils Absolute: 0 10*3/uL (ref 0.0–0.2)
Basos: 1 %
EOS (ABSOLUTE): 0.1 10*3/uL (ref 0.0–0.4)
Eos: 1 %
Hematocrit: 35.2 % (ref 34.0–46.6)
Hemoglobin: 11.7 g/dL (ref 11.1–15.9)
Immature Grans (Abs): 0 10*3/uL (ref 0.0–0.1)
Immature Granulocytes: 0 %
Lymphocytes Absolute: 2.3 10*3/uL (ref 0.7–3.1)
Lymphs: 34 %
MCH: 31 pg (ref 26.6–33.0)
MCHC: 33.2 g/dL (ref 31.5–35.7)
MCV: 93 fL (ref 79–97)
Monocytes Absolute: 0.6 10*3/uL (ref 0.1–0.9)
Monocytes: 9 %
Neutrophils Absolute: 3.6 10*3/uL (ref 1.4–7.0)
Neutrophils: 55 %
Platelets: 198 10*3/uL (ref 150–450)
RBC: 3.77 x10E6/uL (ref 3.77–5.28)
RDW: 12.2 % (ref 11.7–15.4)
WBC: 6.7 10*3/uL (ref 3.4–10.8)

## 2022-09-19 LAB — BASIC METABOLIC PANEL
BUN/Creatinine Ratio: 11 (ref 9–23)
BUN: 11 mg/dL (ref 6–24)
CO2: 25 mmol/L (ref 20–29)
Calcium: 9.7 mg/dL (ref 8.7–10.2)
Chloride: 101 mmol/L (ref 96–106)
Creatinine, Ser: 0.96 mg/dL (ref 0.57–1.00)
Glucose: 87 mg/dL (ref 70–99)
Potassium: 4 mmol/L (ref 3.5–5.2)
Sodium: 145 mmol/L — ABNORMAL HIGH (ref 134–144)
eGFR: 72 mL/min/{1.73_m2} (ref >59)

## 2022-09-19 LAB — URIC ACID: Uric Acid: 9.1 mg/dL — ABNORMAL HIGH (ref 3.0–7.2)

## 2022-09-30 ENCOUNTER — Emergency Department (HOSPITAL_COMMUNITY)

## 2022-09-30 ENCOUNTER — Other Ambulatory Visit: Payer: Self-pay

## 2022-09-30 ENCOUNTER — Encounter (HOSPITAL_COMMUNITY): Payer: Self-pay

## 2022-09-30 ENCOUNTER — Emergency Department (HOSPITAL_COMMUNITY)
Admission: EM | Admit: 2022-09-30 | Discharge: 2022-09-30 | Disposition: A | Discharge status: Home / Self Care | Attending: Emergency Medicine | Admitting: Emergency Medicine

## 2022-09-30 DIAGNOSIS — R0789 Other chest pain: Secondary | ICD-10-CM | POA: Insufficient documentation

## 2022-09-30 DIAGNOSIS — Z79899 Other long term (current) drug therapy: Secondary | ICD-10-CM | POA: Insufficient documentation

## 2022-09-30 DIAGNOSIS — I110 Hypertensive heart disease with heart failure: Secondary | ICD-10-CM | POA: Diagnosis not present

## 2022-09-30 DIAGNOSIS — I509 Heart failure, unspecified: Secondary | ICD-10-CM | POA: Insufficient documentation

## 2022-09-30 DIAGNOSIS — R0602 Shortness of breath: Secondary | ICD-10-CM | POA: Insufficient documentation

## 2022-09-30 DIAGNOSIS — K5792 Diverticulitis of intestine, part unspecified, without perforation or abscess without bleeding: Secondary | ICD-10-CM

## 2022-09-30 DIAGNOSIS — Z20822 Contact with and (suspected) exposure to covid-19: Secondary | ICD-10-CM | POA: Diagnosis not present

## 2022-09-30 DIAGNOSIS — R200 Anesthesia of skin: Secondary | ICD-10-CM

## 2022-09-30 DIAGNOSIS — R202 Paresthesia of skin: Secondary | ICD-10-CM | POA: Diagnosis not present

## 2022-09-30 DIAGNOSIS — K5732 Diverticulitis of large intestine without perforation or abscess without bleeding: Secondary | ICD-10-CM | POA: Diagnosis not present

## 2022-09-30 DIAGNOSIS — R109 Unspecified abdominal pain: Secondary | ICD-10-CM | POA: Diagnosis present

## 2022-09-30 LAB — TROPONIN I (HIGH SENSITIVITY)
Troponin I (High Sensitivity): 6 ng/L (ref <18)
Troponin I (High Sensitivity): 6 ng/L (ref <18)

## 2022-09-30 LAB — BASIC METABOLIC PANEL
Anion gap: 8 (ref 5–15)
BUN: 8 mg/dL (ref 6–20)
CO2: 35 mmol/L — ABNORMAL HIGH (ref 22–32)
Calcium: 8.7 mg/dL — ABNORMAL LOW (ref 8.9–10.3)
Chloride: 100 mmol/L (ref 98–111)
Creatinine, Ser: 1.19 mg/dL — ABNORMAL HIGH (ref 0.44–1.00)
GFR, Estimated: 55 mL/min — ABNORMAL LOW (ref >60)
Glucose, Bld: 129 mg/dL — ABNORMAL HIGH (ref 70–99)
Potassium: 3.1 mmol/L — ABNORMAL LOW (ref 3.5–5.1)
Sodium: 143 mmol/L (ref 135–145)

## 2022-09-30 LAB — CBC
HCT: 40.5 % (ref 36.0–46.0)
Hemoglobin: 12.6 g/dL (ref 12.0–15.0)
MCH: 31 pg (ref 26.0–34.0)
MCHC: 31.1 g/dL (ref 30.0–36.0)
MCV: 99.5 fL (ref 80.0–100.0)
Platelets: 216 10*3/uL (ref 150–400)
RBC: 4.07 MIL/uL (ref 3.87–5.11)
RDW: 13 % (ref 11.5–15.5)
WBC: 7.8 10*3/uL (ref 4.0–10.5)
nRBC: 0 % (ref 0.0–0.2)

## 2022-09-30 LAB — RESP PANEL BY RT-PCR (FLU A&B, COVID) ARPGX2
Influenza A by PCR: NEGATIVE
Influenza B by PCR: NEGATIVE
SARS Coronavirus 2 by RT PCR: NEGATIVE

## 2022-09-30 LAB — BRAIN NATRIURETIC PEPTIDE: B Natriuretic Peptide: 3 pg/mL (ref 0.0–100.0)

## 2022-09-30 MED ORDER — IOHEXOL 300 MG/ML  SOLN
100.0000 mL | Freq: Once | INTRAMUSCULAR | Status: AC | PRN
  Administered 2022-09-30: 100 mL via INTRAVENOUS

## 2022-09-30 MED ORDER — POTASSIUM CHLORIDE CRYS ER 20 MEQ PO TBCR
40.0000 meq | EXTENDED_RELEASE_TABLET | Freq: Once | ORAL | Status: DC
  Filled 2022-09-30: qty 2

## 2022-09-30 MED ORDER — AMOXICILLIN-POT CLAVULANATE 875-125 MG PO TABS: 1.0000 | ORAL_TABLET | Freq: Two times a day (BID) | ORAL | 0 refills | Status: DC

## 2022-09-30 MED ORDER — POTASSIUM CHLORIDE 20 MEQ PO PACK
40.0000 meq | PACK | Freq: Two times a day (BID) | ORAL | Status: DC
  Administered 2022-09-30: 40 meq via ORAL
  Filled 2022-09-30: qty 2

## 2022-09-30 NOTE — ED Provider Notes (Signed)
[facility] EMERGENCY DEPARTMENT Provider Note   CSN: [id number] Arrival date & time: 09/30/22  1138     History Chief Complaint  Patient presents with   Chest Pain   Tingling    chest    Ann Giles is a 51 y.o. female patient with history of HFpEF last echo 60 to 65%, morbid obesity, depression, hypertension who presents to the emergency department with perioral numbness started this morning around 11 AM.  Patient was feeling normal before hand and went to bed normal.  She states that the numbness started around her mouth and radiated into the neck bilaterally into the chest.  She is having some chest heaviness as well.  She endorses some shortness of breath.  She also states that she feels like she has fluid on her lungs.  Patient also endorsing abdominal pain.  She reports associated nausea and diarrhea but no vomiting.  No fever or chills, cough, congestion.  No focal weakness or numbness.   Chest Pain      Home Medications Prior to Admission medications   Medication Sig Start Date End Date Taking? Authorizing Provider  amoxicillin-clavulanate (AUGMENTIN) 875-125 MG tablet Take 1 tablet by mouth every 12 (twelve) hours. 09/30/22  Yes Jamire Shabazz M, PA-C  albuterol (PROVENTIL HFA;VENTOLIN HFA) 108 (90 Base) MCG/ACT inhaler Inhale 2 puffs into the lungs every 6 (six) hours as needed for wheezing or shortness of breath.    [provider]  diphenhydrAMINE (BENADRYL) 25 MG tablet Take 25 mg by mouth every 6 (six) hours as needed for allergies.    [provider]  furosemide (LASIX) 40 MG tablet Take 1 tablet (40 mg total) by mouth 2 (two) times daily. May take additional 20 mg for swelling Daily Patient taking differently: Take 40 mg by mouth daily. May take additional 20 mg for swelling Daily 11/04/20 05/28/22  Barton Dubois, MD  Ipratropium-Albuterol (COMBIVENT) 20-100 MCG/ACT AERS respimat Inhale 1 puff into the lungs every 6 (six) hours as  needed for wheezing. Patient taking differently: Inhale 1 puff into the lungs every 6 (six) hours as needed (Bronchitis). 07/13/21 05/28/22  Shahmehdi, Valeria Batman, MD  losartan (COZAAR) 25 MG tablet Take 1 tablet (25 mg total) by mouth daily. 11/04/20   Barton Dubois, MD  omeprazole (PRILOSEC) 40 MG capsule Take 1 capsule (40 mg total) by mouth daily. Patient taking differently: Take 20 mg by mouth daily as needed (GERD). 07/13/21   Deatra James, MD  OXYGEN Inhale 2 L into the lungs See admin instructions. Uses as needed during the day and nightly on schedule with BIPAP    [provider]  phenylephrine-shark liver oil-mineral oil-petrolatum (PREPARATION H) 0.25-14-74.9 % rectal ointment Place 1 application rectally 2 (two) times daily as needed for hemorrhoids.    [provider]  predniSONE (DELTASONE) 20 MG tablet Take 2 tablets (40 mg total) by mouth daily with breakfast. 09/18/22   Volney American, PA-C  sertraline (ZOLOFT) 100 MG tablet Take 1 tablet (100 mg total) by mouth daily. Patient not taking: Reported on 05/28/2022 07/13/21   Deatra James, MD  Zinc Oxide (DESITIN) 13 % CREA Apply 1 application  topically daily as needed (Helps with diarrhea irritation).    [provider]      Allergies    Patient has no known allergies.    Review of Systems   Review of Systems  Cardiovascular:  Positive for chest pain.  All other systems reviewed and are negative.  Physical Exam Updated Vital Signs BP (!) 149/87   Pulse 63   Temp 98.1 F (36.7 C) (Oral)   Resp 19   Ht 5\' 8"  (1.727 m)   Wt (!) 174.6 kg   SpO2 98%   BMI 58.54 kg/m  Physical Exam Vitals and nursing note reviewed.  Constitutional:      General: She is not in acute distress.    Appearance: Normal appearance. She is obese.  HENT:     Head: Normocephalic and atraumatic.  Eyes:     General:        Right eye: No discharge.        Left eye: No discharge.  Cardiovascular:      Comments: Regular rate and rhythm.  S1/S2 are distinct without any evidence of murmur, rubs, or gallops.  Radial pulses are 2+ bilaterally.  Dorsalis pedis pulses are 2+ bilaterally.  No evidence of pedal edema. Pulmonary:     Comments: Clear to auscultation bilaterally.  Normal effort.  No respiratory distress.  No evidence of wheezes, rales, or rhonchi heard throughout. Abdominal:     General: Abdomen is flat. Bowel sounds are normal. There is no distension.     Tenderness: There is generalized abdominal tenderness. There is no guarding or rebound.  Musculoskeletal:        General: Normal range of motion.     Cervical back: Neck supple.  Skin:    General: Skin is warm and dry.     Findings: No rash.  Neurological:     General: No focal deficit present.     Mental Status: She is alert.     Comments: Cranial nerves II through XII are intact.  5/5 strength to the upper and lower extremities.  Normal sensation to the upper and lower extremities.  Negative pronator drift.  No dysmetria with finger-to-nose. Speech is normal.   Psychiatric:        Mood and Affect: Mood normal.        Behavior: Behavior normal.     ED Results / Procedures / Treatments   Labs (all labs ordered are listed, but only abnormal results are displayed) Labs Reviewed  BASIC METABOLIC PANEL - Abnormal; Notable for the following components:      Result Value   Potassium 3.1 (*)    CO2 35 (*)    Glucose, Bld 129 (*)    Creatinine, Ser 1.19 (*)    Calcium 8.7 (*)    GFR, Estimated 55 (*)    All other components within normal limits  RESP PANEL BY RT-PCR (FLU A&B, COVID) ARPGX2  CBC  BRAIN NATRIURETIC PEPTIDE  TROPONIN I (HIGH SENSITIVITY)  TROPONIN I (HIGH SENSITIVITY)    EKG EKG Interpretation  Date/Time:  Thursday September 30 2022 11:52:52 EDT Ventricular Rate:  73 PR Interval:  170 QRS Duration: 84 QT Interval:  384 QTC Calculation: 423 R Axis:   72 Text Interpretation: Normal sinus rhythm with  sinus arrhythmia Nonspecific T wave abnormality  no significant change since 2022 Confirmed by Sherwood Gambler [id number]) on 09/30/2022 11:56:08 AM  Radiology CT ABDOMEN PELVIS W CONTRAST  Result Date: 09/30/2022 CLINICAL DATA:  Abdominal pain EXAM: CT ABDOMEN AND PELVIS WITH CONTRAST TECHNIQUE: Multidetector CT imaging of the abdomen and pelvis was performed using the standard protocol following bolus administration of intravenous contrast. RADIATION DOSE REDUCTION: This exam was performed according to the departmental dose-optimization program which includes automated exposure control, adjustment of the mA and/or kV according to patient size  and/or use of iterative reconstruction technique. CONTRAST:  152mL OMNIPAQUE IOHEXOL 300 MG/ML  SOLN COMPARISON:  CT examination dated March 12, 2018 FINDINGS: Lower chest: No acute abnormality. Hepatobiliary: No focal liver abnormality is seen. Status post cholecystectomy. No biliary dilatation. Pancreas: Unremarkable. No pancreatic ductal dilatation or surrounding inflammatory changes. Spleen: No splenic injury or perisplenic hematoma. Adrenals/Urinary Tract: Adrenal glands are unremarkable. Multiple bilateral nonobstructing renal calculi. No evidence of hydronephrosis or ureteral calculus. Bladder is unremarkable. Stomach/Bowel: Small hiatal hernia. Postsurgical changes for prior sleeve gastrectomy. Appendix appears normal. No evidence of bowel wall thickening, distention, or inflammatory changes. Colonic diverticulosis prominent in the sigmoid colon with mild adjacent fat stranding (series 2, image 77; series 6, image 71) suspicious for acute diverticulitis. No adjacent fluid collection or abscess. Vascular/Lymphatic: No significant vascular findings are present. No enlarged abdominal or pelvic lymph nodes. Reproductive: Status post hysterectomy. No adnexal masses. Other: Small fat containing umbilical hernia. No abdominopelvic ascites. Musculoskeletal: Degenerate disc  disease of the lumbar spine prominent at L5-S1. No acute osseous abnormality. IMPRESSION: 1. Colonic diverticulosis prominent in the sigmoid colon with mild adjacent fat stranding suspicious for acute diverticulitis. No adjacent fluid collection or abscess. 2. Multiple bilateral nonobstructing renal calculi. No evidence of hydronephrosis or ureteral calculus. 3. Small hiatal hernia. Postsurgical changes for prior sleeve gastrectomy. 4. 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 Electronically Signed   By: Keane Police D.O.   On: 09/30/2022 16:27   CT Head Wo Contrast  Result Date: 09/30/2022 CLINICAL DATA:  Mental status change, unknown cause. EXAM: CT HEAD WITHOUT CONTRAST TECHNIQUE: Contiguous axial images were obtained from the base of the skull through the vertex without intravenous contrast. RADIATION DOSE REDUCTION: This exam was performed according to the departmental dose-optimization program which includes automated exposure control, adjustment of the mA and/or kV according to patient size and/or use of iterative reconstruction technique. COMPARISON:  None Available. FINDINGS: Brain: No evidence of acute infarction, hemorrhage, hydrocephalus, extra-axial collection or mass lesion/mass effect. Vascular: No hyperdense vessel or unexpected calcification. Skull: Normal. Negative for fracture or focal lesion. Sinuses/Orbits: Paranasal sinuses are predominantly clear. Orbits are unremarkable. Other: Mastoid air cells are predominantly clear. IMPRESSION: No acute intracranial findings. Electronically Signed   By: Dahlia Bailiff M.D.   On: 09/30/2022 13:36   DG Chest 2 View  Result Date: 09/30/2022 CLINICAL DATA:  CP EXAM: CHEST - 2 VIEW COMPARISON:  07/09/2021. FINDINGS: The heart size and mediastinal contours are within normal limits. Both lungs are clear. No visible pleural effusions or pneumothorax. No acute osseous abnormality. IMPRESSION: No active cardiopulmonary disease.  Electronically Signed   By: Margaretha Sheffield M.D.   On: 09/30/2022 12:05    Procedures Procedures    Medications Ordered in ED Medications  potassium chloride (KLOR-CON) packet 40 mEq (40 mEq Oral Given 09/30/22 1433)  iohexol (OMNIPAQUE) 300 MG/ML solution 100 mL (100 mLs Intravenous Contrast Given 09/30/22 1600)    ED Course/ Medical Decision Making/ A&P Clinical Course as of 09/30/22 1657  Thu Sep 30, 2022  1557 Troponin I (High Sensitivity) Initial and delta troponin are normal. [CF]  1557 CBC Normal. [CF]  1557 Brain natriuretic peptide Normal. [CF]  0867 Basic metabolic panel(!) Mild hypokalemia. This was repleted here in the ED. Mild elevated creatinine.  [CF]  1642 On reevaluation, patient is feeling better.  She states that her perioral numbness is improving.  I went over all labs and imaging with her at the bedside.  I do  feel the patient is comfortable going home with antibiotics for her diverticulitis.  She is amenable to this plan. [CF]    Clinical Course User Index [CF] Hendricks Limes, PA-C                           Medical Decision Making Ann Giles is a 51 y.o. female patient who presents to the emergency department today for further evaluation of perioral numbness that extends into the chest.  I will evaluate with CT head and cardiac enzymes to further delineate where the etiology is coming from.  Patient's neurological exam is completely normal.  I will also get a CT abdomen pelvis with contrast to further evaluate for her poorly localized abdominal pain.  We will also add on a respiratory panel she has been having some diarrhea and general fatigue.  Apart from being hypertensive here in the ER she is in no acute distress.  As highlighted in ED course, patient is feeling better.  At this time, I have a low suspicion for CVA or TIA.  Her neurological exam is completely normal.  I will suspicion for heart failure and ACS.  I will treat her for  uncomplicated diverticulitis with Augmentin.  Have also given her education on diet and potassium supplementation over the next few days.  Patient expressed full understanding.  All questions or concerns addressed.  Strict return precautions were discussed.  I will have her follow-up with her primary care doctor.  She is safe for discharge.  Amount and/or Complexity of Data Reviewed Labs: ordered. Decision-making details documented in ED Course. Radiology: ordered.  Risk Prescription drug management.   Final Clinical Impression(s) / ED Diagnoses Final diagnoses:  Diverticulitis  Perioral numbness    Rx / DC Orders ED Discharge Orders          Ordered    amoxicillin-clavulanate (AUGMENTIN) 875-125 MG tablet  Every 12 hours        09/30/22 1654              Myna Bright [place], [place] 09/30/22 1657    Sherwood Gambler, MD 10/01/22 1501

## 2022-09-30 NOTE — ED Provider Triage Note (Signed)
Emergency Medicine Provider Triage Evaluation Note  Ann Giles , a 51 y.o. female  was evaluated in triage.  Pt complains of full facial tingling starting today that went into her chest. She reports she has had some chest pain and SOB since sometime today. She reports that she was getting confused or lose her train of thought last night while at work and while coming here today.  Review of Systems  Positive:  Negative:   Physical Exam  BP (!) 186/96 (BP Location: Right Arm)   Pulse 79   Temp 97.8 F (36.6 C) (Oral)   Resp 18   Ht 5\' 8"  (1.727 m)   Wt (!) 174.6 kg   SpO2 95%   BMI 58.54 kg/m  Gen:   Awake, no distress   Resp:  Normal effort  MSK:   Moves extremities without difficulty  Other:  Answering questions appropriately. No focal defect noted.   Medical Decision Making  Medically screening exam initiated at 12:30 PM.  Appropriate orders placed.  Ann Giles was informed that the remainder of the evaluation will be completed by another provider, this initial triage assessment does not replace that evaluation, and the importance of remaining in the ED until their evaluation is complete.  Will order head CT and chest pain labs. LKW was before sometime last night, out of window.    Sherrell Puller, [place] 09/30/22 1232

## 2022-09-30 NOTE — ED Triage Notes (Signed)
Pt reports her feeling heavy in her chest. Pt also reports tingling that goes from her face to her chest.   Pt states she has been forgetful.

## 2022-09-30 NOTE — Discharge Instructions (Addendum)
Please take antibiotics as prescribed.  As we discussed, I would stick to a brat diet which stands for bananas, rice, applesauce, and toast.  You can also add in soups and soup broth.  After a few days you can incorporate your normal diet.  Please supplement potassium for the next few days.  Please follow-up with your primary care doctor for further evaluation.  Return to the emergency room for any worsening symptoms.

## 2022-09-30 NOTE — ED Notes (Signed)
Patient transported to CT 

## 2022-12-19 ENCOUNTER — Ambulatory Visit (INDEPENDENT_AMBULATORY_CARE_PROVIDER_SITE_OTHER)

## 2022-12-19 ENCOUNTER — Ambulatory Visit
Admission: EM | Admit: 2022-12-19 | Discharge: 2022-12-19 | Disposition: A | Discharge status: Home / Self Care | Attending: Family Medicine | Admitting: Family Medicine

## 2022-12-19 DIAGNOSIS — M25531 Pain in right wrist: Secondary | ICD-10-CM | POA: Diagnosis not present

## 2022-12-19 DIAGNOSIS — M109 Gout, unspecified: Secondary | ICD-10-CM | POA: Diagnosis not present

## 2022-12-19 DIAGNOSIS — W19XXXA Unspecified fall, initial encounter: Secondary | ICD-10-CM

## 2022-12-19 HISTORY — DX: Diverticulitis of intestine, part unspecified, without perforation or abscess without bleeding: K57.92

## 2022-12-19 MED ORDER — PREDNISONE 20 MG PO TABS: 40.0000 mg | ORAL_TABLET | Freq: Every day | ORAL | 0 refills | Status: DC

## 2022-12-19 NOTE — Discharge Instructions (Signed)
Start taking tart cherry capsules daily to help prevent gout flares in addition to the dietary and lifestyle modifications listed in the handout provided.

## 2022-12-19 NOTE — ED Notes (Signed)
Scheduled a new patient appt with new provider

## 2022-12-19 NOTE — ED Provider Notes (Signed)
[facility]    CSN: [id number] Arrival date & time: 12/19/22  0905      History   Chief Complaint Chief Complaint  Patient presents with   Gout    HPI Ann Giles is a 51 y.o. female.   Patient presenting today with 1 month history of right wrist pain after a fall.  She kept thinking the pain will go away so she did not get it checked out but the pain has not resolved.  Pain significantly worse with movement but she does have range of motion intact in the wrist.  Denies numbness, tingling, discoloration, swelling at this time.  Not tried anything over-the-counter for symptoms.  Also having a gout flare in her right great toe that started yesterday.  She has a longstanding history of gout and used to be followed by rheumatology per patient.    Past Medical History:  Diagnosis Date   Arthritis    Asthma    Bronchitis    CHF (congestive heart failure) (HCC)    Depression    Diverticulitis    Hypertension    OSA (obstructive sleep apnea)     Patient Active Problem List   Diagnosis Date Noted   COVID-19 virus infection 07/09/2021   Hypoxia    Obesity hypoventilation syndrome (HCC)    Gastroesophageal reflux disease    OSA treated with BiPAP 10/31/2020   Community acquired pneumonia    Acute on chronic diastolic CHF (congestive heart failure) (HCC) 06/23/2019   Acute diastolic CHF (congestive heart failure) (HCC) 12/23/2017   Obesity, Class III, BMI 40-49.9 (morbid obesity) (HCC) 12/23/2017   Acute bronchospasm 09/09/2016   Asthma 09/09/2016   Hypertension 09/09/2016   Depression 09/09/2016   Leg swelling 09/09/2016   Acute respiratory failure with hypoxia (HCC) 09/09/2016   Bronchospasm, acute 09/09/2016    Past Surgical History:  Procedure Laterality Date   ABDOMINAL HYSTERECTOMY     BIOPSY  06/01/2022   Procedure: BIOPSY;  Surgeon: Kerin Salen, MD;  Location: [facility];  Service: Gastroenterology;;   CHOLECYSTECTOMY      COLONOSCOPY WITH PROPOFOL N/A 06/01/2022   Procedure: COLONOSCOPY WITH PROPOFOL;  Surgeon: Kerin Salen, MD;  Location: [facility];  Service: Gastroenterology;  Laterality: N/A;   ESOPHAGOGASTRODUODENOSCOPY (EGD) WITH PROPOFOL N/A 06/01/2022   Procedure: ESOPHAGOGASTRODUODENOSCOPY (EGD) WITH PROPOFOL;  Surgeon: Kerin Salen, MD;  Location: [facility];  Service: Gastroenterology;  Laterality: N/A;   GASTROPLASTY     POLYPECTOMY  06/01/2022   Procedure: POLYPECTOMY;  Surgeon: Kerin Salen, MD;  Location: [facility];  Service: Gastroenterology;;    OB History   No obstetric history on file.      Home Medications    Prior to Admission medications   Medication Sig Start Date End Date Taking? Authorizing Provider  predniSONE (DELTASONE) 20 MG tablet Take 2 tablets (40 mg total) by mouth daily with breakfast. 12/19/22  Yes Particia Nearing, PA-C  albuterol (PROVENTIL HFA;VENTOLIN HFA) 108 (90 Base) MCG/ACT inhaler Inhale 2 puffs into the lungs every 6 (six) hours as needed for wheezing or shortness of breath.    [provider]  amoxicillin-clavulanate (AUGMENTIN) 875-125 MG tablet Take 1 tablet by mouth every 12 (twelve) hours. 09/30/22   Teressa Lower, PA-C  diphenhydrAMINE (BENADRYL) 25 MG tablet Take 25 mg by mouth every 6 (six) hours as needed for allergies.    [provider]  furosemide (LASIX) 40 MG tablet Take 1 tablet (40 mg total) by mouth 2 (two) times  daily. May take additional 20 mg for swelling Daily Patient taking differently: Take 40 mg by mouth daily. May take additional 20 mg for swelling Daily 11/04/20 05/28/22  Vassie Loll, MD  Ipratropium-Albuterol (COMBIVENT) 20-100 MCG/ACT AERS respimat Inhale 1 puff into the lungs every 6 (six) hours as needed for wheezing. Patient taking differently: Inhale 1 puff into the lungs every 6 (six) hours as needed (Bronchitis). 07/13/21 05/28/22  Shahmehdi, Gemma Payor, MD  losartan (COZAAR) 25 MG tablet Take 1 tablet (25  mg total) by mouth daily. 11/04/20   Vassie Loll, MD  omeprazole (PRILOSEC) 40 MG capsule Take 1 capsule (40 mg total) by mouth daily. Patient taking differently: Take 20 mg by mouth daily as needed (GERD). 07/13/21   Kendell Bane, MD  OXYGEN Inhale 2 L into the lungs See admin instructions. Uses as needed during the day and nightly on schedule with BIPAP    [provider]  phenylephrine-shark liver oil-mineral oil-petrolatum (PREPARATION H) 0.25-14-74.9 % rectal ointment Place 1 application rectally 2 (two) times daily as needed for hemorrhoids.    [provider]  predniSONE (DELTASONE) 20 MG tablet Take 2 tablets (40 mg total) by mouth daily with breakfast. 09/18/22   Particia Nearing, PA-C  sertraline (ZOLOFT) 100 MG tablet Take 1 tablet (100 mg total) by mouth daily. Patient not taking: Reported on 05/28/2022 07/13/21   Kendell Bane, MD  Zinc Oxide (DESITIN) 13 % CREA Apply 1 application  topically daily as needed (Helps with diarrhea irritation).    [provider]    Family History Family History  Problem Relation Age of Onset   Asthma Neg Hx     Social History Social History   Tobacco Use   Smoking status: Never   Smokeless tobacco: Never  Substance Use Topics   Alcohol use: No   Drug use: No     Allergies   Patient has no known allergies.   Review of Systems Review of Systems Per HPI  Physical Exam Triage Vital Signs ED Triage Vitals  Enc Vitals Group     BP 12/19/22 1209 (!) 205/105     Pulse Rate 12/19/22 1209 76     Resp 12/19/22 1209 20     Temp 12/19/22 1209 (!) 97.5 F (36.4 C)     Temp Source 12/19/22 1208 Oral     SpO2 12/19/22 1209 92 %     Weight --      Height --      Head Circumference --      Peak Flow --      Pain Score 12/19/22 1213 10     Pain Loc --      Pain Edu? --      Excl. in GC? --    No data found.  Updated Vital Signs BP (!) 205/105 (BP Location: Left Wrist)   Pulse 76   Temp  (!) 97.5 F (36.4 C) (Oral)   Resp 20   SpO2 92%   Visual Acuity Right Eye Distance:   Left Eye Distance:   Bilateral Distance:    Right Eye Near:   Left Eye Near:    Bilateral Near:     Physical Exam Vitals and nursing note reviewed.  Constitutional:      Appearance: Normal appearance. She is not ill-appearing.  HENT:     Head: Atraumatic.  Eyes:     Extraocular Movements: Extraocular movements intact.     Conjunctiva/sclera: Conjunctivae normal.  Cardiovascular:  Rate and Rhythm: Normal rate and regular rhythm.     Heart sounds: Normal heart sounds.  Pulmonary:     Effort: Pulmonary effort is normal.     Breath sounds: Normal breath sounds.  Musculoskeletal:        General: Tenderness and signs of injury present. Normal range of motion.     Cervical back: Normal range of motion and neck supple.     Comments: Right radial wrist tenderness to palpation, range of motion intact, no bony deformity palpable  Skin:    General: Skin is warm and dry.     Findings: Erythema present.     Comments: Erythema and edema to right great toe  Neurological:     Mental Status: She is alert and oriented to person, place, and time.     Comments: All 4 extremities neurovascularly intact  Psychiatric:        Mood and Affect: Mood normal.        Thought Content: Thought content normal.        Judgment: Judgment normal.      UC Treatments / Results  Labs (all labs ordered are listed, but only abnormal results are displayed) Labs Reviewed - No data to display  EKG   Radiology DG Wrist Complete Right  Result Date: 12/19/2022 CLINICAL DATA:  Right wrist pain after fall 1 month ago EXAM: RIGHT WRIST - COMPLETE 3+ VIEW COMPARISON:  09/25/2014 FINDINGS: There is no evidence of fracture or dislocation. There is no evidence of arthropathy or other focal bone abnormality. Generalized soft tissue swelling is most pronounced over the dorsum of the hand. IMPRESSION: Generalized soft tissue  swelling without evidence of acute fracture or dislocation. Electronically Signed   By: Duanne Guess D.O.   On: 12/19/2022 13:00    Procedures Procedures (including critical care time)  Medications Ordered in UC Medications - No data to display  Initial Impression / Assessment and Plan / UC Course  I have reviewed the triage vital signs and the nursing notes.  Pertinent labs & imaging results that were available during my care of the patient were reviewed by me and considered in my medical decision making (see chart for details).     X-ray of the right wrist negative for acute bony abnormality from the recent fall.  Placed in an Ace wrap, discussed supportive over-the-counter remedies and close monitoring for resolution.  Treat with prednisone, tart cherry supplements for the gout and have provided assistance in finding a new primary care provider as she is currently between providers.  Return for worsening symptoms.  Final Clinical Impressions(s) / UC Diagnoses   Final diagnoses:  Acute gout involving toe of right foot, unspecified cause  Right wrist pain     Discharge Instructions      Start taking tart cherry capsules daily to help prevent gout flares in addition to the dietary and lifestyle modifications listed in the handout provided.    ED Prescriptions     Medication Sig Dispense Auth. Provider   predniSONE (DELTASONE) 20 MG tablet Take 2 tablets (40 mg total) by mouth daily with breakfast. 10 tablet Particia Nearing, [place]      PDMP not reviewed this encounter.   Particia Nearing, [place] 12/19/22 1320

## 2022-12-19 NOTE — ED Triage Notes (Signed)
Pt reports gout flare up in her right big toe x 1 day. Also she fell on her right wrist  x 1 month ago and thought the pain would  subside but it didn't. Cannot lift with right hand much and is weak and swollen    Pt needs a new primary. She does not have a rheumatologist that she follows up with for her gout. New diagnosis.

## 2023-01-25 ENCOUNTER — Ambulatory Visit: Admitting: Family Medicine

## 2023-02-15 ENCOUNTER — Encounter: Payer: Self-pay | Admitting: Family Medicine

## 2023-02-15 ENCOUNTER — Ambulatory Visit: Admitting: Family Medicine

## 2023-02-15 VITALS — BP 162/98 | HR 80 | Ht 68.0 in | Wt 393.0 lb

## 2023-02-15 DIAGNOSIS — E0789 Other specified disorders of thyroid: Secondary | ICD-10-CM

## 2023-02-15 DIAGNOSIS — E559 Vitamin D deficiency, unspecified: Secondary | ICD-10-CM

## 2023-02-15 DIAGNOSIS — K5792 Diverticulitis of intestine, part unspecified, without perforation or abscess without bleeding: Secondary | ICD-10-CM

## 2023-02-15 DIAGNOSIS — R7301 Impaired fasting glucose: Secondary | ICD-10-CM

## 2023-02-15 DIAGNOSIS — I10 Essential (primary) hypertension: Secondary | ICD-10-CM | POA: Diagnosis not present

## 2023-02-15 DIAGNOSIS — Z1231 Encounter for screening mammogram for malignant neoplasm of breast: Secondary | ICD-10-CM

## 2023-02-15 DIAGNOSIS — M778 Other enthesopathies, not elsewhere classified: Secondary | ICD-10-CM

## 2023-02-15 DIAGNOSIS — E7849 Other hyperlipidemia: Secondary | ICD-10-CM

## 2023-02-15 DIAGNOSIS — M1000 Idiopathic gout, unspecified site: Secondary | ICD-10-CM | POA: Diagnosis not present

## 2023-02-15 DIAGNOSIS — Z1159 Encounter for screening for other viral diseases: Secondary | ICD-10-CM

## 2023-02-15 DIAGNOSIS — M109 Gout, unspecified: Secondary | ICD-10-CM | POA: Insufficient documentation

## 2023-02-15 MED ORDER — NAPROXEN 500 MG PO TABS: 500.0000 mg | ORAL_TABLET | Freq: Two times a day (BID) | ORAL | 0 refills | Status: DC

## 2023-02-15 MED ORDER — ACETAMINOPHEN 500 MG PO TABS: 500.0000 mg | ORAL_TABLET | Freq: Four times a day (QID) | ORAL | 0 refills | Status: AC | PRN

## 2023-02-15 NOTE — Assessment & Plan Note (Signed)
No recent injury or trauma reported She reports occasional swelling and pain of the right wrist and right thumb Pain significantly worse with movement but she does have range of motion  Denies numbness, tingling, dislocation, swelling at this time X-ray of the right rates on 12/19/2022 shows no evidence of acute fracture or dislocation She reports inability to write, pick up groceries, and use the right hand due to increased pain Encouraged to use extra strength Tylenol for pain relief Encouraged use of wrist splint at nighttime Encourage activity modification to decrease pain Will follow-up in 2 weeks

## 2023-02-15 NOTE — Assessment & Plan Note (Signed)
Patient was seen and treated for gout on 12/19/2022 She reports that she was never properly educated on her diagnosis and would like to be educated today She does not have any gout symptoms today Education as follow Gout Gout is caused by a condition called hyperuricemia, which is when you have too much uric acid in the body Your body often makes uric acid when it breaks down purines (which is usually found in the in your body and the food that you eat) Making changes to your diet and lifestyle will help prevent gout flareup I recommend losing weight and maintaining a normal BMI, limiting alcohol intake, avoiding high purines food  like red meats, organ meats, seafood. Sugary drinks and sweets which can cause gout flareups

## 2023-02-15 NOTE — Assessment & Plan Note (Signed)
The patient was seen and treated for diverticulitis on 09/30/2022 She reports that she was never properly educated on the diagnosis of diverticulitis Education as follow Diverticulitis -Inflammation or infection in 1 or more small pouches in your digestive tract This can often cause abdominal pain, fever, nausea, and changes in bowel movements Not eating enough fiber makes your stool harder and exerts more pressure on your large intestine to push it out of the body To prevent flareup of diverticulitis, I recommend eating high-fiber foods such as fruits, vegetables, and whole grains; this allows the stool to be softer and to move quickly through your large intestine.

## 2023-02-15 NOTE — Assessment & Plan Note (Signed)
Uncontrolled She reports taking losartan 25 mg daily and furosemide 40 mg daily Patient is asymptomatic today She reports not taking her medications today We will follow-up on BP in 2 weeks Encouraged also of diet with increased physical activity BP Readings from Last 3 Encounters:  02/15/23 (!) 162/98  12/19/22 (!) 205/105  09/30/22 (!) 149/87

## 2023-02-15 NOTE — Progress Notes (Signed)
New Patient Office Visit  Subjective:  Patient ID: Ann Giles, female    DOB: December 18, 1971  Age: 52 y.o. MRN: [id number]  CC:  Chief Complaint  Patient presents with   Establish Care    New patient, previously seen with [facility] with Dr. Ayesha Rumpf. Was seen by urgent care on 12/19/2022, they gave her dx of gout, need treatment for this. Pt also reports shooting pain on her left wrist, had an xray on 12/19/2022, was told she has tendinitis she needs treatment for this also still having a lot of pain. Has concerns about diverticulitis.     HPI Ann Giles is a 52 y.o. female with past medical history of gout, diverticulitis, pneumonia, presents for establishing care. For the details of today's visit, please refer to the assessment and plan.       Past Medical History:  Diagnosis Date   Arthritis    Asthma    Bronchitis    CHF (congestive heart failure) ([place])    Depression    Diverticulitis    Hypertension    OSA (obstructive sleep apnea)     Past Surgical History:  Procedure Laterality Date   ABDOMINAL HYSTERECTOMY     BIOPSY  06/01/2022   Procedure: BIOPSY;  Surgeon: Ronnette Juniper, MD;  Location: [facility];  Service: Gastroenterology;;   CHOLECYSTECTOMY     COLONOSCOPY WITH PROPOFOL N/A 06/01/2022   Procedure: COLONOSCOPY WITH PROPOFOL;  Surgeon: Ronnette Juniper, MD;  Location: [facility];  Service: Gastroenterology;  Laterality: N/A;   ESOPHAGOGASTRODUODENOSCOPY (EGD) WITH PROPOFOL N/A 06/01/2022   Procedure: ESOPHAGOGASTRODUODENOSCOPY (EGD) WITH PROPOFOL;  Surgeon: Ronnette Juniper, MD;  Location: [facility];  Service: Gastroenterology;  Laterality: N/A;   GASTROPLASTY     POLYPECTOMY  06/01/2022   Procedure: POLYPECTOMY;  Surgeon: Ronnette Juniper, MD;  Location: [facility];  Service: Gastroenterology;;    Family History  Problem Relation Age of Onset   Asthma Neg Hx     Social History   Socioeconomic History   Marital status: Widowed     Spouse name: Not on file   Number of children: Not on file   Years of education: Not on file   Highest education level: Not on file  Occupational History   Not on file  Tobacco Use   Smoking status: Never   Smokeless tobacco: Never  Vaping Use   Vaping Use: Not on file  Substance and Sexual Activity   Alcohol use: No   Drug use: No   Sexual activity: Not on file  Other Topics Concern   Not on file  Social History Narrative   Not on file   Social Determinants of Health   Financial Resource Strain: Not on file  Food Insecurity: Not on file  Transportation Needs: Not on file  Physical Activity: Not on file  Stress: Not on file  Social Connections: Not on file  Intimate Partner Violence: Not on file    ROS Review of Systems  Constitutional:  Negative for chills and fever.  Eyes:  Negative for visual disturbance.  Respiratory:  Negative for chest tightness and shortness of breath.   Musculoskeletal:  Positive for arthralgias (right wrist pain).  Neurological:  Negative for dizziness and headaches.    Objective:   Today's Vitals: BP (!) 162/98 (BP Location: Left Arm)   Pulse 80   Ht '5\' 8"'$  (1.727 m)   Wt (!) 393 lb 0.6 oz (178.3 kg)   SpO2 90%   BMI 59.76 kg/m  Physical Exam HENT:     Head: Normocephalic.     Mouth/Throat:     Mouth: Mucous membranes are moist.  Cardiovascular:     Rate and Rhythm: Normal rate.     Heart sounds: Normal heart sounds.  Pulmonary:     Effort: Pulmonary effort is normal.     Breath sounds: Normal breath sounds.  Musculoskeletal:     Right wrist: Tenderness present.     Comments: +2 radial pulse of the right wrist Pain reported with radial deviation Slight pain with extension of the right wrist No pain with flexion and ulnar deviation Pain with palpation of the right thumb  Neurological:     Mental Status: She is alert.      Assessment & Plan:   Primary hypertension Assessment & Plan: Uncontrolled She reports taking  losartan 25 mg daily and furosemide 40 mg daily Patient is asymptomatic today She reports not taking her medications today We will follow-up on BP in 2 weeks Encouraged also of diet with increased physical activity BP Readings from Last 3 Encounters:  02/15/23 (!) 162/98  12/19/22 (!) 205/105  09/30/22 (!) 149/87     Orders: -     CBC with Differential/Platelet -     CMP14+EGFR  Tendinitis of right wrist Assessment & Plan: No recent injury or trauma reported She reports occasional swelling and pain of the right wrist and right thumb Pain significantly worse with movement but she does have range of motion  Denies numbness, tingling, dislocation, swelling at this time X-ray of the right rates on 12/19/2022 shows no evidence of acute fracture or dislocation She reports inability to write, pick up groceries, and use the right hand due to increased pain Encouraged to use extra strength Tylenol for pain relief Encouraged use of wrist splint at nighttime Encourage activity modification to decrease pain Will follow-up in 2 weeks   Diverticulitis Assessment & Plan: The patient was seen and treated for diverticulitis on 09/30/2022 She reports that she was never properly educated on the diagnosis of diverticulitis Education as follow Diverticulitis -Inflammation or infection in 1 or more small pouches in your digestive tract This can often cause abdominal pain, fever, nausea, and changes in bowel movements Not eating enough fiber makes your stool harder and exerts more pressure on your large intestine to push it out of the body To prevent flareup of diverticulitis, I recommend eating high-fiber foods such as fruits, vegetables, and whole grains; this allows the stool to be softer and to move quickly through your large intestine.   Acute idiopathic gout, unspecified site Assessment & Plan: Patient was seen and treated for gout on 12/19/2022 She reports that she was never properly  educated on her diagnosis and would like to be educated today She does not have any gout symptoms today Education as follow Gout Gout is caused by a condition called hyperuricemia, which is when you have too much uric acid in the body Your body often makes uric acid when it breaks down purines (which is usually found in the in your body and the food that you eat) Making changes to your diet and lifestyle will help prevent gout flareup I recommend losing weight and maintaining a normal BMI, limiting alcohol intake, avoiding high purines food  like red meats, organ meats, seafood. Sugary drinks and sweets which can cause gout flareups   Other specified disorders of thyroid -     TSH + free T4  Impaired fasting blood sugar -  Hemoglobin A1c  Vitamin D deficiency -     VITAMIN D 25 Hydroxy (Vit-D Deficiency, Fractures)  Other hyperlipidemia -     Lipid panel  Breast cancer screening by mammogram -     3D Screening Mammogram, Left and Right  Tendonitis of wrist, right -     Acetaminophen; Take 1 tablet (500 mg total) by mouth every 6 (six) hours as needed.  Dispense: 30 tablet; Refill: 0  Need for hepatitis C screening test -     Hepatitis C antibody     Follow-up: Return in about 2 weeks (around 03/01/2023) for BP.   Alvira Monday, FNP

## 2023-02-15 NOTE — Patient Instructions (Addendum)
Pap:   I appreciate the opportunity to provide care to you today!    Follow up:  2 weeks for wrist tendonitis and BP  Labs: please stop by the lab today to get your blood drawn (CBC, CMP, TSH, Lipid profile, HgA1c, Vit D)  Diverticulitis -Inflammation or infection in 1 or more small pouches in your digestive tract This can often cause abdominal pain, fever, nausea, and changes in bowel movements Not eating enough fiber makes your stool harder and exerts more pressure on your large intestine to push it out of the body To prevent flareup of diverticulitis, I recommend eating high-fiber foods such as fruits, vegetables, and whole grains; this allows the stool to be softer and to move quickly through your large intestine.   Gout Gout is caused by a condition called hyperuricemia, which is when you have too much uric acid in the body Your body often makes uric acid when it breaks down purines (which is usually found in the in your body and the food that you eat) Making changes to your diet and lifestyle will help prevent gout flareup I recommend losing weight and maintaining a normal BMI, limiting alcohol intake, avoiding high purines food  like red meats, organ meats, seafood. Sugary drinks and sweets which can cause gout flareups  Right Wrist tendonitis Will provide a short supply of naproxen 500 mg to take twice daily for wrist pain Recommend activity modification, avoiding activities that elicit pain I also recommend the use of wrist splints at nighttime for joint mobilization I also recommend the application of heat and cold therapy at the affected site to decrease pain and swelling    Please continue to a heart-healthy diet and increase your physical activities. Try to exercise for 36mns at least five times a week.      It was a pleasure to see you and I look forward to continuing to work together on your health and well-being. Please do not hesitate to call the office if you need  care or have questions about your care.   Have a wonderful day and week. With Gratitude, GAlvira MondayMSN, FNP-BC

## 2023-03-01 ENCOUNTER — Encounter: Payer: Self-pay | Admitting: Family Medicine

## 2023-03-01 ENCOUNTER — Ambulatory Visit: Admitting: Family Medicine

## 2023-03-08 ENCOUNTER — Other Ambulatory Visit: Payer: Self-pay | Admitting: Family Medicine

## 2023-03-08 DIAGNOSIS — M778 Other enthesopathies, not elsewhere classified: Secondary | ICD-10-CM

## 2023-04-13 ENCOUNTER — Ambulatory Visit (HOSPITAL_COMMUNITY)
Admission: RE | Admit: 2023-04-13 | Discharge: 2023-04-13 | Disposition: A | Discharge status: Home / Self Care | Source: Ambulatory Visit | Attending: Family Medicine | Admitting: Family Medicine

## 2023-04-13 ENCOUNTER — Encounter (HOSPITAL_COMMUNITY): Payer: Self-pay

## 2023-04-13 DIAGNOSIS — Z1231 Encounter for screening mammogram for malignant neoplasm of breast: Secondary | ICD-10-CM | POA: Diagnosis present

## 2023-05-12 ENCOUNTER — Emergency Department (HOSPITAL_COMMUNITY)
Admission: EM | Admit: 2023-05-12 | Discharge: 2023-05-13 | Disposition: A | Discharge status: Home / Self Care | Attending: Emergency Medicine | Admitting: Emergency Medicine

## 2023-05-12 DIAGNOSIS — Z79899 Other long term (current) drug therapy: Secondary | ICD-10-CM | POA: Diagnosis not present

## 2023-05-12 DIAGNOSIS — J45909 Unspecified asthma, uncomplicated: Secondary | ICD-10-CM | POA: Diagnosis not present

## 2023-05-12 DIAGNOSIS — R1032 Left lower quadrant pain: Secondary | ICD-10-CM | POA: Diagnosis present

## 2023-05-12 DIAGNOSIS — K5732 Diverticulitis of large intestine without perforation or abscess without bleeding: Secondary | ICD-10-CM | POA: Diagnosis not present

## 2023-05-12 DIAGNOSIS — D72829 Elevated white blood cell count, unspecified: Secondary | ICD-10-CM | POA: Insufficient documentation

## 2023-05-12 DIAGNOSIS — I10 Essential (primary) hypertension: Secondary | ICD-10-CM | POA: Diagnosis not present

## 2023-05-12 DIAGNOSIS — K5792 Diverticulitis of intestine, part unspecified, without perforation or abscess without bleeding: Secondary | ICD-10-CM

## 2023-05-13 ENCOUNTER — Other Ambulatory Visit: Payer: Self-pay

## 2023-05-13 ENCOUNTER — Emergency Department (HOSPITAL_COMMUNITY)

## 2023-05-13 LAB — COMPREHENSIVE METABOLIC PANEL
ALT: 18 U/L (ref 0–44)
AST: 15 U/L (ref 15–41)
Albumin: 3.5 g/dL (ref 3.5–5.0)
Alkaline Phosphatase: 53 U/L (ref 38–126)
Anion gap: 13 (ref 5–15)
BUN: 10 mg/dL (ref 6–20)
CO2: 29 mmol/L (ref 22–32)
Calcium: 8.8 mg/dL — ABNORMAL LOW (ref 8.9–10.3)
Chloride: 98 mmol/L (ref 98–111)
Creatinine, Ser: 0.94 mg/dL (ref 0.44–1.00)
GFR, Estimated: >60 mL/min (ref >60)
Glucose, Bld: 111 mg/dL — ABNORMAL HIGH (ref 70–99)
Potassium: 3.3 mmol/L — ABNORMAL LOW (ref 3.5–5.1)
Sodium: 140 mmol/L (ref 135–145)
Total Bilirubin: 1.5 mg/dL — ABNORMAL HIGH (ref 0.3–1.2)
Total Protein: 7.8 g/dL (ref 6.5–8.1)

## 2023-05-13 LAB — CBC
HCT: 38 % (ref 36.0–46.0)
Hemoglobin: 12.1 g/dL (ref 12.0–15.0)
MCH: 31.4 pg (ref 26.0–34.0)
MCHC: 31.8 g/dL (ref 30.0–36.0)
MCV: 98.7 fL (ref 80.0–100.0)
Platelets: 203 10*3/uL (ref 150–400)
RBC: 3.85 MIL/uL — ABNORMAL LOW (ref 3.87–5.11)
RDW: 13.2 % (ref 11.5–15.5)
WBC: 10.8 10*3/uL — ABNORMAL HIGH (ref 4.0–10.5)
nRBC: 0 % (ref 0.0–0.2)

## 2023-05-13 LAB — URINALYSIS, ROUTINE W REFLEX MICROSCOPIC
Bilirubin Urine: NEGATIVE
Glucose, UA: NEGATIVE mg/dL
Hgb urine dipstick: NEGATIVE
Ketones, ur: 20 mg/dL — AB
Leukocytes,Ua: NEGATIVE
Nitrite: NEGATIVE
Protein, ur: 30 mg/dL — AB
Specific Gravity, Urine: >1.046 — ABNORMAL HIGH (ref 1.005–1.030)
pH: 5 (ref 5.0–8.0)

## 2023-05-13 LAB — LIPASE, BLOOD: Lipase: 22 U/L (ref 11–51)

## 2023-05-13 MED ORDER — IOHEXOL 300 MG/ML  SOLN
100.0000 mL | Freq: Once | INTRAMUSCULAR | Status: AC | PRN
  Administered 2023-05-13: 100 mL via INTRAVENOUS

## 2023-05-13 MED ORDER — METRONIDAZOLE 500 MG PO TABS: 500.0000 mg | ORAL_TABLET | Freq: Three times a day (TID) | ORAL | 0 refills | Status: DC

## 2023-05-13 MED ORDER — METRONIDAZOLE 500 MG PO TABS
500.0000 mg | ORAL_TABLET | Freq: Once | ORAL | Status: AC
  Administered 2023-05-13: 500 mg via ORAL
  Filled 2023-05-13: qty 1

## 2023-05-13 MED ORDER — CIPROFLOXACIN HCL 500 MG PO TABS: 500.0000 mg | ORAL_TABLET | Freq: Two times a day (BID) | ORAL | 0 refills | Status: DC

## 2023-05-13 MED ORDER — SODIUM CHLORIDE 0.9 % IV BOLUS
1000.0000 mL | Freq: Once | INTRAVENOUS | Status: AC
  Administered 2023-05-13: 1000 mL via INTRAVENOUS

## 2023-05-13 MED ORDER — OXYCODONE-ACETAMINOPHEN 5-325 MG PO TABS: 1.0000 | ORAL_TABLET | Freq: Four times a day (QID) | ORAL | 0 refills | Status: DC | PRN

## 2023-05-13 MED ORDER — CIPROFLOXACIN HCL 250 MG PO TABS
500.0000 mg | ORAL_TABLET | Freq: Once | ORAL | Status: AC
  Administered 2023-05-13: 500 mg via ORAL
  Filled 2023-05-13: qty 2

## 2023-05-13 NOTE — ED Triage Notes (Signed)
Pt c/o LLQ pain, tenderness with palpation. Endorses nausea and diarrhea.

## 2023-05-13 NOTE — Discharge Instructions (Signed)
Begin taking Cipro and Flagyl as prescribed.  Take Percocet as prescribed as needed for pain.  Follow-up with primary doctor if not improving in the next few days, and return to the ER if you develop worsening pain, high fever, bloody stools, or for other new and concerning symptoms.

## 2023-05-13 NOTE — ED Provider Notes (Signed)
[place] EMERGENCY DEPARTMENT AT [facility] Provider Note   CSN: [id number] Arrival date & time: 05/12/23  2351     History  Chief Complaint  Patient presents with   Abdominal Pain    Ann Giles is a 52 y.o. female.  Patient is a 52 year old female with past medical history of asthma, hypertension, sleep apnea, prior cholecystectomy.  Patient presenting today with complaints of left lower quadrant pain.  This has been worsening over the past 2 weeks.  She describes occasional diarrhea that is nonbloody.  No fevers or chills.  No urinary complaints.  No aggravating or alleviating factors.  She has had diverticulitis in the past and this feels somewhat similar.  The history is provided by the patient.       Home Medications Prior to Admission medications   Medication Sig Start Date End Date Taking? Authorizing Provider  acetaminophen (TYLENOL) 500 MG tablet Take 1 tablet (500 mg total) by mouth every 6 (six) hours as needed. 02/15/23   Gilmore Laroche, FNP  albuterol (PROVENTIL HFA;VENTOLIN HFA) 108 (90 Base) MCG/ACT inhaler Inhale 2 puffs into the lungs every 6 (six) hours as needed for wheezing or shortness of breath.    [provider]  diphenhydrAMINE (BENADRYL) 25 MG tablet Take 25 mg by mouth every 6 (six) hours as needed for allergies.    [provider]  furosemide (LASIX) 40 MG tablet Take 1 tablet (40 mg total) by mouth 2 (two) times daily. May take additional 20 mg for swelling Daily Patient taking differently: Take 40 mg by mouth daily. May take additional 20 mg for swelling Daily 11/04/20 02/21/23  Vassie Loll, MD  Ipratropium-Albuterol (COMBIVENT) 20-100 MCG/ACT AERS respimat Inhale 1 puff into the lungs every 6 (six) hours as needed for wheezing. Patient taking differently: Inhale 1 puff into the lungs every 6 (six) hours as needed (Bronchitis). 07/13/21 02/21/23  Shahmehdi, Gemma Payor, MD  losartan (COZAAR) 25 MG tablet Take 1  tablet (25 mg total) by mouth daily. 11/04/20   Vassie Loll, MD  omeprazole (PRILOSEC) 40 MG capsule Take 1 capsule (40 mg total) by mouth daily. Patient taking differently: Take 20 mg by mouth daily as needed (GERD). 07/13/21   Kendell Bane, MD  OXYGEN Inhale 2 L into the lungs See admin instructions. Uses as needed during the day and nightly on schedule with BIPAP    [provider]  phenylephrine-shark liver oil-mineral oil-petrolatum (PREPARATION H) 0.25-14-74.9 % rectal ointment Place 1 application rectally 2 (two) times daily as needed for hemorrhoids.    [provider]  Zinc Oxide (DESITIN) 13 % CREA Apply 1 application  topically daily as needed (Helps with diarrhea irritation).    [provider]      Allergies    Patient has no known allergies.    Review of Systems   Review of Systems  All other systems reviewed and are negative.   Physical Exam Updated Vital Signs BP (!) 193/111   Pulse 87   Temp (!) 97.3 F (36.3 C)   Resp (!) 23   Ht 5\' 8"  (1.727 m)   Wt (!) 179.2 kg   SpO2 91%   BMI 60.06 kg/m  Physical Exam Vitals and nursing note reviewed.  Constitutional:      General: She is not in acute distress.    Appearance: She is well-developed. She is not diaphoretic.  HENT:     Head: Normocephalic and atraumatic.  Cardiovascular:  Rate and Rhythm: Normal rate and regular rhythm.     Heart sounds: No murmur heard.    No friction rub. No gallop.  Pulmonary:     Effort: Pulmonary effort is normal. No respiratory distress.     Breath sounds: Normal breath sounds. No wheezing.  Abdominal:     General: Bowel sounds are normal. There is no distension.     Palpations: Abdomen is soft.     Tenderness: There is abdominal tenderness in the left lower quadrant. There is no right CVA tenderness, left CVA tenderness, guarding or rebound.  Musculoskeletal:        General: Normal range of motion.     Cervical back: Normal range of motion  and neck supple.  Skin:    General: Skin is warm and dry.  Neurological:     General: No focal deficit present.     Mental Status: She is alert and oriented to person, place, and time.     ED Results / Procedures / Treatments   Labs (all labs ordered are listed, but only abnormal results are displayed) Labs Reviewed  LIPASE, BLOOD  COMPREHENSIVE METABOLIC PANEL  CBC  URINALYSIS, ROUTINE W REFLEX MICROSCOPIC    EKG None  Radiology No results found.  Procedures Procedures    Medications Ordered in ED Medications  sodium chloride 0.9 % bolus 1,000 mL (has no administration in time range)    ED Course/ Medical Decision Making/ A&P  Patient is a 52 year old female presenting with left lower quadrant pain.  She has history of diverticulitis and this feels somewhat similar.  Patient arrives here with stable vital signs and is afebrile.  She is clinically well-appearing and in no acute distress.  Physical examination reveals left lower quadrant tenderness, but no other abnormal findings.  Workup initiated including CBC, CMP, and lipase.  She has mild leukocytosis with white count of 10.8, but no elevation of her liver or pancreatic enzymes and no electrolyte derangement.  Urinalysis showing microscopic hematuria, but no evidence for infection.  CT scan of the abdomen and pelvis obtained showing acute, uncomplicated diverticulitis.  She has been given normal saline along with Zofran.  Her nausea has improved, but pain persist.  As patient is driving I am unable to administer pain medications here.  Patient will be discharged with Cipro and Flagyl, medication for pain, and follow-up as needed if symptoms are not improving.  Final Clinical Impression(s) / ED Diagnoses Final diagnoses:  None    Rx / DC Orders ED Discharge Orders     None         Geoffery Lyons, MD 05/13/23 0201

## 2023-05-17 MED FILL — Oxycodone w/ Acetaminophen Tab 5-325 MG: ORAL | Qty: 6 | Status: AC

## 2024-01-30 ENCOUNTER — Telehealth: Payer: Self-pay | Admitting: Family Medicine

## 2024-01-30 NOTE — Telephone Encounter (Signed)
 Copied from CRM [id number]. Topic: Appointments - Scheduling Inquiry for Clinic >> Jan 27, 2024 10:52 AM Ann Giles J wrote: Reason for CRM: Pt needs a prescription for losartan  (COZAAR ) 25 MG tablet that was prescribed by her previous PCP to last her until she sees [name], but she doesn't have anything available until March and the pt is out of the medication that she needs daily. Is there anyway you can get the pt in before then so that she can get her Blood pressure controlled  Callback # [phone]

## 2024-01-31 ENCOUNTER — Other Ambulatory Visit: Payer: Self-pay | Admitting: Family Medicine

## 2024-01-31 MED ORDER — LOSARTAN POTASSIUM 25 MG PO TABS: 25.0000 mg | ORAL_TABLET | Freq: Every day | ORAL | 2 refills | Status: DC

## 2024-01-31 NOTE — Telephone Encounter (Signed)
Copied from CRM [id number]. Topic: Clinical - Medication Refill >> Jan 31, 2024  1:44 PM Ann Giles wrote: Most Recent Primary Care Visit:  Provider: Gilmore Laroche  Department: [facility]  Visit Type: NEW PATIENT  Date: 02/15/2023  Medication: losartan (COZAAR) 25 MG tablet   Has the patient contacted their pharmacy? Yes they said she had to contact doctor office (Agent: If no, request that the patient contact the pharmacy for the refill. If patient does not wish to contact the pharmacy document the reason why and proceed with request.) (Agent: If yes, when and what did the pharmacy advise?)  Is this the correct pharmacy for this prescription? Yes If no, delete pharmacy and type the correct one.  This is the patient's preferred pharmacy:  [facility] [phone] - [place], [place] - [address] AT [facility] OF [address] [place] [address] Phone: [phone] Fax: [phone]  [id number] - [place], [place] - [address] AT [facility] OF [address]  [place] [address] Phone: [phone] Fax: [phone]   Has the prescription been filled recently? No  Is the patient out of the medication? Yes  Has the patient been seen for an appointment in the last year OR does the patient have an upcoming appointment? Yes  Can we respond through MyChart? Yes just sent link for patient to sign up   Agent: Please be advised that Rx refills may take up to 3 business days. We ask that you follow-up with your pharmacy.

## 2024-01-31 NOTE — Telephone Encounter (Signed)
Copied from CRM [id number]. Topic: Clinical - Medication Refill >> Jan 31, 2024  1:44 PM Dimitri Ped wrote: Most Recent Primary Care Visit:  Provider: Gilmore Laroche  Department: [facility]  Visit Type: NEW PATIENT  Date: 02/15/2023  Medication: losartan (COZAAR) 25 MG tablet   Has the patient contacted their pharmacy? Yes they said she had to contact doctor office (Agent: If no, request that the patient contact the pharmacy for the refill. If patient does not wish to contact the pharmacy document the reason why and proceed with request.) (Agent: If yes, when and what did the pharmacy advise?)  Is this the correct pharmacy for this prescription? Yes If no, delete pharmacy and type the correct one.  This is the patient's preferred pharmacy:  [facility] [phone] - [place], [place] - [address] AT [facility] OF [address] [place] [address] Phone: [phone] Fax: [phone]  [id number] - [place], [place] - [address] AT [facility] OF [address] [place] [address] Phone: [phone] Fax: [phone]   Has the prescription been filled recently? No  Is the patient out of the medication? Yes  Has the patient been seen for an appointment in the last year OR does the patient have an upcoming appointment? Yes  Can we respond through MyChart? Yes just sent link for patient to sign up   Agent: Please be advised that Rx refills may take up to 3 business days. We ask that you follow-up with your pharmacy. >> Jan 31, 2024  2:10 PM Whitney O wrote: Rhina Brackett it to clinic first sorry so I'm resending to the right place

## 2024-02-01 NOTE — Patient Instructions (Signed)

## 2024-02-01 NOTE — Progress Notes (Unsigned)
   Established Patient Office Visit   Subjective  Patient ID: Ann Giles, female    DOB: 05-24-1971  Age: 53 y.o. MRN: [id number]  No chief complaint on file.   She  has a past medical history of Arthritis, Asthma, Bronchitis, CHF (congestive heart failure) (HCC), Depression, Diverticulitis, Hypertension, and OSA (obstructive sleep apnea).  GI Problem    ROS    Objective:     There were no vitals taken for this visit. {Vitals History (Optional):23777}  Physical Exam   No results found for any visits on 02/03/24.  The ASCVD Risk score (Arnett DK, et al., 2019) failed to calculate for the following reasons:   Cannot find a previous HDL lab    Assessment & Plan:  There are no diagnoses linked to this encounter.  No follow-ups on file.   Cruzita Lederer Newman Nip, FNP

## 2024-02-03 ENCOUNTER — Encounter: Payer: Self-pay | Admitting: Family Medicine

## 2024-02-03 ENCOUNTER — Ambulatory Visit (INDEPENDENT_AMBULATORY_CARE_PROVIDER_SITE_OTHER): Admitting: Family Medicine

## 2024-02-03 VITALS — BP 170/90 | HR 93 | Resp 17 | Ht 68.0 in | Wt 361.0 lb

## 2024-02-03 DIAGNOSIS — R109 Unspecified abdominal pain: Secondary | ICD-10-CM

## 2024-02-03 DIAGNOSIS — K625 Hemorrhage of anus and rectum: Secondary | ICD-10-CM

## 2024-02-03 DIAGNOSIS — G8929 Other chronic pain: Secondary | ICD-10-CM | POA: Insufficient documentation

## 2024-02-03 DIAGNOSIS — I10 Essential (primary) hypertension: Secondary | ICD-10-CM

## 2024-02-03 MED ORDER — LOSARTAN POTASSIUM 50 MG PO TABS
50.0000 mg | ORAL_TABLET | Freq: Every day | ORAL | 2 refills | Status: DC
Start: 2024-02-03 — End: 2024-03-19

## 2024-02-03 NOTE — Assessment & Plan Note (Signed)
Referral to GI Increase oral fluid intake. Bland diet as tolerated. Avoid fluids that have a lot sugar or caffeine, Avoid spicy or fatty food. Avoid alcohol. Can take OTC tylenol for pain. Follow-up in unable to keep food/fluid down x 24 hours, dizziness, fevers, worsening or persistent symptoms to present to ED or contact primary care provider. Patient verbalizes understanding regarding plan of care and all questions answered.

## 2024-02-03 NOTE — Assessment & Plan Note (Addendum)
Increased Losartan 50 mg once daily, continue Lasix 40 mg once daily. Follow up needed to in 2 weeks Discussed with  patient to monitor their blood pressure regularly and maintain a heart-healthy diet rich in fruits, vegetables, whole grains, and low-fat dairy, while reducing sodium intake to less than 2,300 mg per day. Regular physical activity, such as 30 minutes of moderate exercise most days of the week, will help lower blood pressure and improve overall cardiovascular health. Avoiding smoking, limiting alcohol consumption, and managing stress. Take  prescribed medication, & take it as directed and avoid skipping doses. Seek emergency care if your blood pressure is (over 180/100) or you experience chest pain, shortness of breath, or sudden vision changes.Patient verbalizes understanding regarding plan of care and all questions answered.

## 2024-02-23 ENCOUNTER — Encounter: Payer: Self-pay | Admitting: *Deleted

## 2024-02-25 ENCOUNTER — Other Ambulatory Visit: Payer: Self-pay | Admitting: Family Medicine

## 2024-02-29 ENCOUNTER — Encounter: Payer: Self-pay | Admitting: Internal Medicine

## 2024-02-29 ENCOUNTER — Ambulatory Visit (INDEPENDENT_AMBULATORY_CARE_PROVIDER_SITE_OTHER): Payer: Self-pay | Admitting: Internal Medicine

## 2024-02-29 VITALS — BP 150/85 | HR 77 | Temp 98.1°F | Ht 68.0 in | Wt 365.0 lb

## 2024-02-29 DIAGNOSIS — R1032 Left lower quadrant pain: Secondary | ICD-10-CM | POA: Diagnosis not present

## 2024-02-29 DIAGNOSIS — K625 Hemorrhage of anus and rectum: Secondary | ICD-10-CM

## 2024-02-29 DIAGNOSIS — Z8719 Personal history of other diseases of the digestive system: Secondary | ICD-10-CM

## 2024-02-29 DIAGNOSIS — R197 Diarrhea, unspecified: Secondary | ICD-10-CM

## 2024-02-29 DIAGNOSIS — K529 Noninfective gastroenteritis and colitis, unspecified: Secondary | ICD-10-CM

## 2024-02-29 MED ORDER — DICYCLOMINE HCL 10 MG PO CAPS: 10.0000 mg | ORAL_CAPSULE | Freq: Three times a day (TID) | ORAL | 2 refills | Status: AC

## 2024-02-29 NOTE — Patient Instructions (Signed)
 We will schedule you for colonoscopy given your history of diverticulitis, abdominal pain, and diarrhea.  I will research on a new medication called dicyclomine, you can take this up to 4 times a day.  It may cause constipation, if that is the case, then would decrease dosage.  It was very nice meeting you today.  Dr. Marletta Lor

## 2024-02-29 NOTE — Progress Notes (Signed)
 Primary Care Physician:  Gilmore Laroche, FNP Primary Gastroenterologist:  Dr. Marletta Lor  Chief Complaint  Patient presents with   New Patient (Initial Visit)    Pt referred for rectal bleeding (for a month ago). Pt does have hemorrhoids    HPI:   Ann Giles is a 53 y.o. female who presents to the clinic today by referral from her PCP Gilmore Laroche for evaluation.  Multiple GI complaints for me today.  Patient has had recurrent diverticulitis.Marland Kitchen  2 episodes confirmed by CT 05/13/2023 and again 09/30/2022.  I personally reviewed CT imaging from 05/13/2023 which showed evidence of sigmoid diverticulitis.  She reports that she has been treated for other episodes as well with antibiotics due to consistent symptoms.  Believes she has had diverticulitis a total of 6 times.  Reports chronic left lower quadrant abdominal pain, intermittent, mild to moderate.  When she has acute flares of her diverticulitis this becomes more severe.  Does not radiate.  Also notes intermittent rectal bleeding with bowel movements.  No clots.  Does have issues with chronically loose stools as well.  Last colonoscopy 06/01/2022 with oozing hemorrhoids, normal TI, hypertrophied anal papillae, 1 polyp removed (tubular adenoma) of the descending colon, removed.  Past Medical History:  Diagnosis Date   Arthritis    Asthma    Bronchitis    CHF (congestive heart failure) (HCC)    Depression    Diverticulitis    Hypertension    OSA (obstructive sleep apnea)     Past Surgical History:  Procedure Laterality Date   ABDOMINAL HYSTERECTOMY     BIOPSY  06/01/2022   Procedure: BIOPSY;  Surgeon: Kerin Salen, MD;  Location: [facility];  Service: Gastroenterology;;   CHOLECYSTECTOMY     COLONOSCOPY WITH PROPOFOL N/A 06/01/2022   Procedure: COLONOSCOPY WITH PROPOFOL;  Surgeon: Kerin Salen, MD;  Location: [facility];  Service: Gastroenterology;  Laterality: N/A;   ESOPHAGOGASTRODUODENOSCOPY (EGD) WITH  PROPOFOL N/A 06/01/2022   Procedure: ESOPHAGOGASTRODUODENOSCOPY (EGD) WITH PROPOFOL;  Surgeon: Kerin Salen, MD;  Location: [facility];  Service: Gastroenterology;  Laterality: N/A;   GASTROPLASTY     POLYPECTOMY  06/01/2022   Procedure: POLYPECTOMY;  Surgeon: Kerin Salen, MD;  Location: [facility];  Service: Gastroenterology;;    Current Outpatient Medications  Medication Sig Dispense Refill   acetaminophen (TYLENOL) 500 MG tablet Take 1 tablet (500 mg total) by mouth every 6 (six) hours as needed. 30 tablet 0   albuterol (PROVENTIL HFA;VENTOLIN HFA) 108 (90 Base) MCG/ACT inhaler Inhale 2 puffs into the lungs every 6 (six) hours as needed for wheezing or shortness of breath.     diphenhydrAMINE (BENADRYL) 25 MG tablet Take 25 mg by mouth every 6 (six) hours as needed for allergies.     losartan (COZAAR) 50 MG tablet Take 1 tablet (50 mg total) by mouth daily. 30 tablet 2   omeprazole (PRILOSEC) 40 MG capsule Take 1 capsule (40 mg total) by mouth daily. (Patient taking differently: Take 20 mg by mouth daily as needed (GERD).) 30 capsule 2   OXYGEN Inhale 2 L into the lungs See admin instructions. Uses as needed during the day and nightly on schedule with BIPAP     phenylephrine-shark liver oil-mineral oil-petrolatum (PREPARATION H) 0.25-14-74.9 % rectal ointment Place 1 application rectally 2 (two) times daily as needed for hemorrhoids.     Zinc Oxide (DESITIN) 13 % CREA Apply 1 application  topically daily as needed (Helps with diarrhea irritation).     furosemide (  LASIX) 40 MG tablet Take 1 tablet (40 mg total) by mouth 2 (two) times daily. May take additional 20 mg for swelling Daily (Patient taking differently: Take 40 mg by mouth daily. May take additional 20 mg for swelling Daily) 60 tablet 2   Ipratropium-Albuterol (COMBIVENT) 20-100 MCG/ACT AERS respimat Inhale 1 puff into the lungs every 6 (six) hours as needed for wheezing. (Patient taking differently: Inhale 1 puff into the lungs every  6 (six) hours as needed (Bronchitis).) 4 g 3   No current facility-administered medications for this visit.    Allergies as of 02/29/2024   (No Known Allergies)    Family History  Problem Relation Age of Onset   Breast cancer Maternal Aunt    Breast cancer Maternal Aunt    Asthma Neg Hx     Social History   Socioeconomic History   Marital status: Widowed    Spouse name: Not on file   Number of children: Not on file   Years of education: Not on file   Highest education level: Not on file  Occupational History   Not on file  Tobacco Use   Smoking status: Never   Smokeless tobacco: Never  Vaping Use   Vaping status: Not on file  Substance and Sexual Activity   Alcohol use: No   Drug use: No   Sexual activity: Not on file  Other Topics Concern   Not on file  Social History Narrative   Not on file   Social Drivers of Health   Financial Resource Strain: Not on file  Food Insecurity: No Food Insecurity (03/13/2021)   Received from [facility], [facility]   Hunger Vital Sign    Worried About Running Out of Food in the Last Year: Never true    Ran Out of Food in the Last Year: Never true  Transportation Needs: Not on file  Physical Activity: Not on file  Stress: Not on file  Social Connections: Unknown (05/02/2022)   Received from [facility], [facility]   Social Network    Social Network: Not on file  Intimate Partner Violence: Unknown (03/24/2022)   [facility] from [facility], [facility]   HITS    Physically Hurt: Not on file    Insult or Talk Down To: Not on file    Threaten Physical Harm: Not on file    Scream or Curse: Not on file    Subjective: Review of Systems  Constitutional:  Positive for weight loss. Negative for chills and fever.  HENT:  Negative for congestion and hearing loss.   Eyes:  Negative for blurred vision and double vision.  Respiratory:  Negative for cough and shortness of breath.   Cardiovascular:  Negative for chest pain  and palpitations.  Gastrointestinal:  Positive for abdominal pain and diarrhea. Negative for blood in stool, constipation, heartburn, melena and vomiting.  Genitourinary:  Negative for dysuria and urgency.  Musculoskeletal:  Negative for joint pain and myalgias.  Skin:  Negative for itching and rash.  Neurological:  Negative for dizziness and headaches.  Psychiatric/Behavioral:  Negative for depression. The patient is not nervous/anxious.        Objective: BP (!) 150/85   Pulse 77   Temp 98.1 F (36.7 C)   Ht 5\' 8"  (1.727 m)   Wt (!) 365 lb (165.6 kg)   BMI 55.50 kg/m  Physical Exam Constitutional:      Appearance: Normal appearance. She is obese.  HENT:     Head:  Normocephalic and atraumatic.  Eyes:     Extraocular Movements: Extraocular movements intact.     Conjunctiva/sclera: Conjunctivae normal.  Cardiovascular:     Rate and Rhythm: Normal rate and regular rhythm.  Pulmonary:     Effort: Pulmonary effort is normal.     Breath sounds: Normal breath sounds.  Abdominal:     General: Bowel sounds are normal.     Palpations: Abdomen is soft.  Musculoskeletal:        General: No swelling. Normal range of motion.     Cervical back: Normal range of motion and neck supple.  Skin:    General: Skin is warm and dry.     Coloration: Skin is not jaundiced.  Neurological:     General: No focal deficit present.     Mental Status: She is alert and oriented to person, place, and time.  Psychiatric:        Mood and Affect: Mood normal.        Behavior: Behavior normal.      Assessment: *Chronic abdominal pain *Rectal bleeding *Recurrent diverticulitis *Diarrhea-chronic  Plan: Etiology of patient's symptoms unclear, given her rectal bleeding and abdominal pain as well as multiple episodes of diverticulitis, will pursue colonoscopy to further evaluate.  She may have developed SCAD.  Will perform segmental biopsies.  The risks including infection, bleed, or perforation as  well as benefits, limitations, alternatives and imponderables have been reviewed with the patient. Questions have been answered. All parties agreeable.  Start dicyclomine 10 mg 4 times daily.  May be a good candidate for hemorrhoid banding pending colonoscopy results.  Could consider trial of TCA as well.  Follow-up after colonoscopy.   02/29/2024 2:55 PM   Disclaimer: This note was dictated with voice recognition software. Similar sounding words can inadvertently be transcribed and may not be corrected upon review.

## 2024-03-01 ENCOUNTER — Encounter: Payer: Self-pay | Admitting: Internal Medicine

## 2024-03-06 ENCOUNTER — Encounter: Payer: Self-pay | Admitting: *Deleted

## 2024-03-15 ENCOUNTER — Ambulatory Visit: Payer: Self-pay | Admitting: Family Medicine

## 2024-03-19 ENCOUNTER — Ambulatory Visit: Payer: Self-pay | Admitting: Family Medicine

## 2024-03-19 ENCOUNTER — Encounter: Payer: Self-pay | Admitting: Family Medicine

## 2024-03-19 VITALS — BP 172/81 | HR 57 | Ht 68.0 in | Wt 365.1 lb

## 2024-03-19 DIAGNOSIS — I10 Essential (primary) hypertension: Secondary | ICD-10-CM

## 2024-03-19 MED ORDER — TELMISARTAN 40 MG PO TABS
40.0000 mg | ORAL_TABLET | Freq: Every day | ORAL | 1 refills | Status: DC
Start: 2024-03-19 — End: 2024-05-30

## 2024-03-19 NOTE — Patient Instructions (Addendum)
 I appreciate the opportunity to provide care to you today!    Follow up:  1 months   Hypertension Management  Your current blood pressure is above the target goal of <140/90 mmHg. To address this, please continue taking telmisartan 40 mg daily   Medication Instructions: Take your blood pressure medication at the same time each day. After taking your medication, check your blood pressure at least an hour later. If your first reading is >140/90 mmHg, wait at least 10 minutes and recheck your blood pressure. Side Effects: In the initial days of therapy, you may experience dizziness or lightheadedness as your body adjusts to the lower blood pressure; this is expected. Diet and Lifestyle: Adhere to a low-sodium diet, limiting intake to less than 1500 mg daily, and increase your physical activity. Avoid over-the-counter NSAIDs such as ibuprofen and naproxen while on this medication. Hydration and Nutrition: Stay well-hydrated by drinking at least 64 ounces of water daily. Increase your servings of fruits and vegetables and avoid excessive sodium in your diet. Long-Term Considerations: Uncontrolled hypertension can increase the risk of cardiovascular diseases, including stroke, coronary artery disease, and heart failure.  Please report to the emergency department if your blood pressure exceeds 180/120 and is accompanied by symptoms such as headaches, chest pain, palpitations, blurred vision, or dizziness.    Attached with your AVS, you will find valuable resources for self-education. I highly recommend dedicating some time to thoroughly examine them.   Please continue to a heart-healthy diet and increase your physical activities. Try to exercise for at least five days a week.    It was a pleasure to see you and I look forward to continuing to work together on your health and well-being. Please do not hesitate to call the office if you need care or have questions about your care.  In case  of emergency, please visit the Emergency Department for urgent care, or contact our clinic at [phone] to schedule an appointment. We're here to help you!   Have a wonderful day and week. With Gratitude, Gilmore Laroche MSN, FNP-BC

## 2024-03-19 NOTE — Progress Notes (Signed)
 Established Patient Office Visit  Subjective:  Patient ID: Ann Giles, female    DOB: [birth year]  Age: 53 y.o. MRN: [id number]  CC:  Chief Complaint  Patient presents with   Hypertension    2 week Hypertension f/u     HPI Ann Giles is a 53 y.o. female with past medical history of Hypertension presents for HTN f/u. For the details of today's visit, please refer to the assessment and plan.     Past Medical History:  Diagnosis Date   Arthritis    Asthma    Bronchitis    CHF (congestive heart failure) (HCC)    Depression    Diverticulitis    Hypertension    OSA (obstructive sleep apnea)     Past Surgical History:  Procedure Laterality Date   ABDOMINAL HYSTERECTOMY     BIOPSY  06/01/2022   Procedure: BIOPSY;  Surgeon: Kerin Salen, MD;  Location: [facility];  Service: Gastroenterology;;   CHOLECYSTECTOMY     COLONOSCOPY WITH PROPOFOL N/A 06/01/2022   Procedure: COLONOSCOPY WITH PROPOFOL;  Surgeon: Kerin Salen, MD;  Location: [facility];  Service: Gastroenterology;  Laterality: N/A;   ESOPHAGOGASTRODUODENOSCOPY (EGD) WITH PROPOFOL N/A 06/01/2022   Procedure: ESOPHAGOGASTRODUODENOSCOPY (EGD) WITH PROPOFOL;  Surgeon: Kerin Salen, MD;  Location: [facility];  Service: Gastroenterology;  Laterality: N/A;   GASTROPLASTY     POLYPECTOMY  06/01/2022   Procedure: POLYPECTOMY;  Surgeon: Kerin Salen, MD;  Location: [facility];  Service: Gastroenterology;;    Family History  Problem Relation Age of Onset   Breast cancer Maternal Aunt    Breast cancer Maternal Aunt    Asthma Neg Hx     Social History   Socioeconomic History   Marital status: Widowed    Spouse name: Not on file   Number of children: Not on file   Years of education: Not on file   Highest education level: Not on file  Occupational History   Not on file  Tobacco Use   Smoking status: Never   Smokeless tobacco: Never  Vaping Use   Vaping status: Not on file   Substance and Sexual Activity   Alcohol use: No   Drug use: No   Sexual activity: Not on file  Other Topics Concern   Not on file  Social History Narrative   Not on file   Social Drivers of Health   Financial Resource Strain: Not on file  Food Insecurity: No Food Insecurity (03/13/2021)   Received from [facility], [facility]   Hunger Vital Sign    Worried About Running Out of Food in the Last Year: Never true    Ran Out of Food in the Last Year: Never true  Transportation Needs: Not on file  Physical Activity: Not on file  Stress: Not on file  Social Connections: Unknown (05/02/2022)   Received from [facility], [facility]   Social Network    Social Network: Not on file  Intimate Partner Violence: Unknown (03/24/2022)   Received from [facility], [facility]   HITS    Physically Hurt: Not on file    Insult or Talk Down To: Not on file    Threaten Physical Harm: Not on file    Scream or Curse: Not on file    Outpatient Medications Prior to Visit  Medication Sig Dispense Refill   acetaminophen (TYLENOL) 500 MG tablet Take 1 tablet (500 mg total) by mouth every 6 (six) hours as needed. 30 tablet 0  albuterol (PROVENTIL HFA;VENTOLIN HFA) 108 (90 Base) MCG/ACT inhaler Inhale 2 puffs into the lungs every 6 (six) hours as needed for wheezing or shortness of breath.     dicyclomine (BENTYL) 10 MG capsule Take 1 capsule (10 mg total) by mouth 4 (four) times daily -  before meals and at bedtime. 120 capsule 2   diphenhydrAMINE (BENADRYL) 25 MG tablet Take 25 mg by mouth every 6 (six) hours as needed for allergies.     furosemide (LASIX) 40 MG tablet Take 1 tablet (40 mg total) by mouth 2 (two) times daily. May take additional 20 mg for swelling Daily (Patient taking differently: Take 40 mg by mouth daily. May take additional 20 mg for swelling Daily) 60 tablet 2   Ipratropium-Albuterol (COMBIVENT) 20-100 MCG/ACT AERS respimat Inhale 1 puff into the lungs every 6 (six)  hours as needed for wheezing. (Patient taking differently: Inhale 1 puff into the lungs every 6 (six) hours as needed (Bronchitis).) 4 g 3   omeprazole (PRILOSEC) 40 MG capsule Take 1 capsule (40 mg total) by mouth daily. (Patient taking differently: Take 20 mg by mouth daily as needed (GERD).) 30 capsule 2   OXYGEN Inhale 2 L into the lungs See admin instructions. Uses as needed during the day and nightly on schedule with BIPAP     phenylephrine-shark liver oil-mineral oil-petrolatum (PREPARATION H) 0.25-14-74.9 % rectal ointment Place 1 application rectally 2 (two) times daily as needed for hemorrhoids.     Zinc Oxide (DESITIN) 13 % CREA Apply 1 application  topically daily as needed (Helps with diarrhea irritation).     losartan (COZAAR) 50 MG tablet Take 1 tablet (50 mg total) by mouth daily. 30 tablet 2   No facility-administered medications prior to visit.    No Known Allergies  ROS Review of Systems  Constitutional:  Negative for chills and fever.  Eyes:  Negative for visual disturbance.  Respiratory:  Negative for chest tightness and shortness of breath.   Neurological:  Negative for dizziness and headaches.      Objective:    Physical Exam HENT:     Head: Normocephalic.     Mouth/Throat:     Mouth: Mucous membranes are moist.  Cardiovascular:     Rate and Rhythm: Normal rate.     Heart sounds: Normal heart sounds.  Pulmonary:     Effort: Pulmonary effort is normal.     Breath sounds: Normal breath sounds.  Neurological:     Mental Status: She is alert.     BP (!) 172/81 (Cuff Size: Large)   Pulse (!) 57   Ht 5\' 8"  (1.727 m)   Wt (!) 365 lb 1.9 oz (165.6 kg)   SpO2 95%   BMI 55.52 kg/m  Wt Readings from Last 3 Encounters:  03/19/24 (!) 365 lb 1.9 oz (165.6 kg)  02/29/24 (!) 365 lb (165.6 kg)  02/03/24 (!) 361 lb (163.7 kg)    Lab Results  Component Value Date   TSH 2.218 10/31/2020   Lab Results  Component Value Date   WBC 10.8 (H) 05/13/2023   HGB  12.1 05/13/2023   HCT 38.0 05/13/2023   MCV 98.7 05/13/2023   PLT 203 05/13/2023   Lab Results  Component Value Date   NA 140 05/13/2023   K 3.3 (L) 05/13/2023   CO2 29 05/13/2023   GLUCOSE 111 (H) 05/13/2023   BUN 10 05/13/2023   CREATININE 0.94 05/13/2023   BILITOT 1.5 (H) 05/13/2023   ALKPHOS 53  05/13/2023   AST 15 05/13/2023   ALT 18 05/13/2023   PROT 7.8 05/13/2023   ALBUMIN 3.5 05/13/2023   CALCIUM 8.8 (L) 05/13/2023   ANIONGAP 13 05/13/2023   EGFR 72 09/18/2022   Lab Results  Component Value Date   CHOL 151 06/24/2019   Lab Results  Component Value Date   HDL 64 06/24/2019   Lab Results  Component Value Date   LDLCALC 78 06/24/2019   Lab Results  Component Value Date   TRIG 43 06/24/2019   Lab Results  Component Value Date   CHOLHDL 2.4 06/24/2019   No results found for: "HGBA1C"    Assessment & Plan:  Primary hypertension Assessment & Plan: The patient presented with uncontrolled blood pressure during the clinic visit. She is currently taking losartan 50 mg daily and furosemide 40 mg twice daily. It was decided to discontinue losartan and initiate telmisartan 40 mg daily for improved blood pressure control. The patient was counseled on the signs and symptoms of hypotension and instructed to hold antihypertensive medication if blood pressure drops below 90/60 mmHg. Lifestyle modifications were also discussed, including adherence to a low-sodium diet and increasing physical activity to support blood pressure management.     Orders: -     Telmisartan; Take 1 tablet (40 mg total) by mouth daily.  Dispense: 30 tablet; Refill: 1   Note: This chart has been completed using Engineer, civil (consulting) software, and while attempts have been made to ensure accuracy, certain words and phrases may not be transcribed as intended.   Follow-up: Return in about 1 month (around 04/18/2024).   Gilmore Laroche, FNP

## 2024-03-19 NOTE — Assessment & Plan Note (Signed)
 The patient presented with uncontrolled blood pressure during the clinic visit. She is currently taking losartan 50 mg daily and furosemide 40 mg twice daily. It was decided to discontinue losartan and initiate telmisartan 40 mg daily for improved blood pressure control. The patient was counseled on the signs and symptoms of hypotension and instructed to hold antihypertensive medication if blood pressure drops below 90/60 mmHg. Lifestyle modifications were also discussed, including adherence to a low-sodium diet and increasing physical activity to support blood pressure management.

## 2024-03-23 NOTE — Patient Instructions (Signed)
 Ann Giles  03/23/2024     @PREFPERIOPPHARMACY @   Your procedure is scheduled on  04/02/2024.   Report to [facility] at  0930  A.M.   Call this number if you have problems the morning of surgery:  [phone]  If you experience any cold or flu symptoms such as cough, fever, chills, shortness of breath, etc. between now and your scheduled surgery, please notify us at the above number.   Remember:         Use your inhaler before you come and bring your rescue inhaler with you.   Follow the diet and prep instructions given to you by the office.   You may drink clear liquids until  0730 am on 4/14/225.    Clear liquids allowed are:                    Water, Juice (No red color; non-citric and without pulp; diabetics please choose diet or no sugar options), Carbonated beverages (diabetics please choose diet or no sugar options), Clear Tea (No creamer, milk, or cream, including half & half and powdered creamer), Black Coffee Only (No creamer, milk or cream, including half & half and powdered creamer), and Clear Sports drink (No red color; diabetics please choose diet or no sugar options)    Take these medicines the morning of surgery with A SIP OF WATER                                                omeprazole.    Do not wear jewelry, make-up or nail polish, including gel polish,  artificial nails, or any other type of covering on natural nails (fingers and  toes).  Do not wear lotions, powders, or perfumes, or deodorant.  Do not shave 48 hours prior to surgery.  Men may shave face and neck.  Do not bring valuables to the hospital.  [facility] is not responsible for any belongings or valuables.  Contacts, dentures or bridgework may not be worn into surgery.  Leave your suitcase in the car.  After surgery it may be brought to your room.  For patients admitted to the hospital, discharge time will be determined by your treatment team.  Patients discharged  the day of surgery will not be allowed to drive home and must have someone with them for 24 hours.    Special instructions:   DO NOT smoke tobacco or vape for 24 hours before your procedure.  Please read over the following fact sheets that you were given. Anesthesia Post-op Instructions and Care and Recovery After Surgery      Colonoscopy, Adult, Care After The following information offers guidance on how to care for yourself after your procedure. Your health care provider may also give you more specific instructions. If you have problems or questions, contact your health care provider. What can I expect after the procedure? After the procedure, it is common to have: A small amount of blood in your stool for 24 hours after the procedure. Some gas. Mild cramping or bloating of your abdomen. Follow these instructions at home: Eating and drinking  Drink enough fluid to keep your urine pale yellow. Follow instructions from your health care provider about eating or drinking restrictions. Resume your normal diet as told by your health care provider.  Avoid heavy or fried foods that are hard to digest. Activity Rest as told by your health care provider. Avoid sitting for a long time without moving. Get up to take short walks every 1-2 hours. This is important to improve blood flow and breathing. Ask for help if you feel weak or unsteady. Return to your normal activities as told by your health care provider. Ask your health care provider what activities are safe for you. Managing cramping and bloating  Try walking around when you have cramps or feel bloated. If directed, apply heat to your abdomen as told by your health care provider. Use the heat source that your health care provider recommends, such as a moist heat pack or a heating pad. Place a towel between your skin and the heat source. Leave the heat on for 20-30 minutes. Remove the heat if your skin turns bright red. This is especially  important if you are unable to feel pain, heat, or cold. You have a greater risk of getting burned. General instructions If you were given a sedative during the procedure, it can affect you for several hours. Do not drive or operate machinery until your health care provider says that it is safe. For the first 24 hours after the procedure: Do not sign important documents. Do not drink alcohol. Do your regular daily activities at a slower pace than normal. Eat soft foods that are easy to digest. Take over-the-counter and prescription medicines only as told by your health care provider. Keep all follow-up visits. This is important. Contact a health care provider if: You have blood in your stool 2-3 days after the procedure. Get help right away if: You have more than a small spotting of blood in your stool. You have large blood clots in your stool. You have swelling of your abdomen. You have nausea or vomiting. You have a fever. You have increasing pain in your abdomen that is not relieved with medicine. These symptoms may be an emergency. Get help right away. Call 911. Do not wait to see if the symptoms will go away. Do not drive yourself to the hospital. Summary After the procedure, it is common to have a small amount of blood in your stool. You may also have mild cramping and bloating of your abdomen. If you were given a sedative during the procedure, it can affect you for several hours. Do not drive or operate machinery until your health care provider says that it is safe. Get help right away if you have a lot of blood in your stool, nausea or vomiting, a fever, or increased pain in your abdomen. This information is not intended to replace advice given to you by your health care provider. Make sure you discuss any questions you have with your health care provider. Document Revised: 01/18/2023 Document Reviewed: 07/29/2021 Elsevier Patient Education  2024 [employer]General Anesthesia,  Adult, Care After The following information offers guidance on how to care for yourself after your procedure. Your health care provider may also give you more specific instructions. If you have problems or questions, contact your health care provider. What can I expect after the procedure? After the procedure, it is common for people to: Have pain or discomfort at the IV site. Have nausea or vomiting. Have a sore throat or hoarseness. Have trouble concentrating. Feel cold or chills. Feel weak, sleepy, or tired (fatigue). Have soreness and body aches. These can affect parts of the body that were not involved in surgery. Follow these  instructions at home: For the time period you were told by your health care provider:  Rest. Do not participate in activities where you could fall or become injured. Do not drive or use machinery. Do not drink alcohol. Do not take sleeping pills or medicines that cause drowsiness. Do not make important decisions or sign legal documents. Do not take care of children on your own. General instructions Drink enough fluid to keep your urine pale yellow. If you have sleep apnea, surgery and certain medicines can increase your risk for breathing problems. Follow instructions from your health care provider about wearing your sleep device: Anytime you are sleeping, including during daytime naps. While taking prescription pain medicines, sleeping medicines, or medicines that make you drowsy. Return to your normal activities as told by your health care provider. Ask your health care provider what activities are safe for you. Take over-the-counter and prescription medicines only as told by your health care provider. Do not use any products that contain nicotine or tobacco. These products include cigarettes, chewing tobacco, and vaping devices, such as e-cigarettes. These can delay incision healing after surgery. If you need help quitting, ask your health care  provider. Contact a health care provider if: You have nausea or vomiting that does not get better with medicine. You vomit every time you eat or drink. You have pain that does not get better with medicine. You cannot urinate or have bloody urine. You develop a skin rash. You have a fever. Get help right away if: You have trouble breathing. You have chest pain. You vomit blood. These symptoms may be an emergency. Get help right away. Call 911. Do not wait to see if the symptoms will go away. Do not drive yourself to the hospital. Summary After the procedure, it is common to have a sore throat, hoarseness, nausea, vomiting, or to feel weak, sleepy, or fatigue. For the time period you were told by your health care provider, do not drive or use machinery. Get help right away if you have difficulty breathing, have chest pain, or vomit blood. These symptoms may be an emergency. This information is not intended to replace advice given to you by your health care provider. Make sure you discuss any questions you have with your health care provider. Document Revised: 03/05/2022 Document Reviewed: 03/05/2022 Elsevier Patient Education  2024 [employer].

## 2024-03-26 ENCOUNTER — Encounter (HOSPITAL_COMMUNITY)
Admission: RE | Admit: 2024-03-26 | Discharge: 2024-03-26 | Disposition: A | Discharge status: Home / Self Care | Payer: Self-pay | Source: Ambulatory Visit | Attending: Internal Medicine | Admitting: Internal Medicine

## 2024-03-26 ENCOUNTER — Encounter (HOSPITAL_COMMUNITY): Payer: Self-pay

## 2024-03-26 VITALS — HR 57 | Temp 98.0°F | Resp 18 | Ht 68.0 in | Wt 365.1 lb

## 2024-03-26 DIAGNOSIS — Z01818 Encounter for other preprocedural examination: Secondary | ICD-10-CM | POA: Diagnosis present

## 2024-03-26 DIAGNOSIS — I10 Essential (primary) hypertension: Secondary | ICD-10-CM | POA: Diagnosis not present

## 2024-03-26 DIAGNOSIS — Z79899 Other long term (current) drug therapy: Secondary | ICD-10-CM | POA: Insufficient documentation

## 2024-03-26 DIAGNOSIS — R9431 Abnormal electrocardiogram [ECG] [EKG]: Secondary | ICD-10-CM | POA: Insufficient documentation

## 2024-03-26 HISTORY — DX: Gastro-esophageal reflux disease without esophagitis: K21.9

## 2024-03-26 LAB — CBC WITH DIFFERENTIAL/PLATELET
Abs Immature Granulocytes: 0.01 10*3/uL (ref 0.00–0.07)
Basophils Absolute: 0 10*3/uL (ref 0.0–0.1)
Basophils Relative: 1 %
Eosinophils Absolute: 0.1 10*3/uL (ref 0.0–0.5)
Eosinophils Relative: 2 %
HCT: 38.6 % (ref 36.0–46.0)
Hemoglobin: 12.1 g/dL (ref 12.0–15.0)
Immature Granulocytes: 0 %
Lymphocytes Relative: 43 %
Lymphs Abs: 2 10*3/uL (ref 0.7–4.0)
MCH: 31.2 pg (ref 26.0–34.0)
MCHC: 31.3 g/dL (ref 30.0–36.0)
MCV: 99.5 fL (ref 80.0–100.0)
Monocytes Absolute: 0.4 10*3/uL (ref 0.1–1.0)
Monocytes Relative: 9 %
Neutro Abs: 2.1 10*3/uL (ref 1.7–7.7)
Neutrophils Relative %: 45 %
Platelets: 233 10*3/uL (ref 150–400)
RBC: 3.88 MIL/uL (ref 3.87–5.11)
RDW: 13.5 % (ref 11.5–15.5)
WBC: 4.6 10*3/uL (ref 4.0–10.5)
nRBC: 0 % (ref 0.0–0.2)

## 2024-03-26 LAB — BASIC METABOLIC PANEL WITH GFR
Anion gap: 9 (ref 5–15)
BUN: 9 mg/dL (ref 6–20)
CO2: 30 mmol/L (ref 22–32)
Calcium: 9.2 mg/dL (ref 8.9–10.3)
Chloride: 102 mmol/L (ref 98–111)
Creatinine, Ser: 1.05 mg/dL — ABNORMAL HIGH (ref 0.44–1.00)
GFR, Estimated: >60 mL/min (ref >60)
Glucose, Bld: 86 mg/dL (ref 70–99)
Potassium: 4.6 mmol/L (ref 3.5–5.1)
Sodium: 141 mmol/L (ref 135–145)

## 2024-04-02 ENCOUNTER — Telehealth: Payer: Self-pay | Admitting: Gastroenterology

## 2024-04-02 ENCOUNTER — Encounter (HOSPITAL_COMMUNITY)
Admission: RE | Disposition: A | Discharge status: Home / Self Care | Payer: Self-pay | Source: Home / Self Care | Attending: Internal Medicine

## 2024-04-02 ENCOUNTER — Encounter (HOSPITAL_COMMUNITY): Payer: Self-pay | Admitting: Internal Medicine

## 2024-04-02 ENCOUNTER — Ambulatory Visit (HOSPITAL_COMMUNITY)

## 2024-04-02 ENCOUNTER — Ambulatory Visit (HOSPITAL_COMMUNITY)
Admission: RE | Admit: 2024-04-02 | Discharge: 2024-04-02 | Disposition: A | Discharge status: Home / Self Care | Payer: Self-pay | Attending: Internal Medicine | Admitting: Internal Medicine

## 2024-04-02 DIAGNOSIS — I5033 Acute on chronic diastolic (congestive) heart failure: Secondary | ICD-10-CM | POA: Diagnosis not present

## 2024-04-02 DIAGNOSIS — G4733 Obstructive sleep apnea (adult) (pediatric): Secondary | ICD-10-CM | POA: Insufficient documentation

## 2024-04-02 DIAGNOSIS — K5730 Diverticulosis of large intestine without perforation or abscess without bleeding: Secondary | ICD-10-CM

## 2024-04-02 DIAGNOSIS — K648 Other hemorrhoids: Secondary | ICD-10-CM | POA: Insufficient documentation

## 2024-04-02 DIAGNOSIS — K6389 Other specified diseases of intestine: Secondary | ICD-10-CM | POA: Diagnosis not present

## 2024-04-02 DIAGNOSIS — I110 Hypertensive heart disease with heart failure: Secondary | ICD-10-CM

## 2024-04-02 DIAGNOSIS — K5732 Diverticulitis of large intestine without perforation or abscess without bleeding: Secondary | ICD-10-CM | POA: Diagnosis not present

## 2024-04-02 DIAGNOSIS — K529 Noninfective gastroenteritis and colitis, unspecified: Secondary | ICD-10-CM | POA: Diagnosis present

## 2024-04-02 DIAGNOSIS — K625 Hemorrhage of anus and rectum: Secondary | ICD-10-CM | POA: Diagnosis not present

## 2024-04-02 DIAGNOSIS — K219 Gastro-esophageal reflux disease without esophagitis: Secondary | ICD-10-CM | POA: Diagnosis not present

## 2024-04-02 DIAGNOSIS — G8929 Other chronic pain: Secondary | ICD-10-CM | POA: Insufficient documentation

## 2024-04-02 DIAGNOSIS — Z8719 Personal history of other diseases of the digestive system: Secondary | ICD-10-CM | POA: Diagnosis not present

## 2024-04-02 DIAGNOSIS — I509 Heart failure, unspecified: Secondary | ICD-10-CM | POA: Diagnosis not present

## 2024-04-02 DIAGNOSIS — K6289 Other specified diseases of anus and rectum: Secondary | ICD-10-CM | POA: Diagnosis not present

## 2024-04-02 SURGERY — COLONOSCOPY
Anesthesia: General

## 2024-04-02 MED ORDER — GLYCOPYRROLATE PF 0.2 MG/ML IJ SOSY
PREFILLED_SYRINGE | INTRAMUSCULAR | Status: DC | PRN
  Administered 2024-04-02: .2 mg via INTRAVENOUS

## 2024-04-02 MED ORDER — DEXMEDETOMIDINE HCL IN NACL 80 MCG/20ML IV SOLN
INTRAVENOUS | Status: DC | PRN
  Administered 2024-04-02: 8 ug via INTRAVENOUS

## 2024-04-02 MED ORDER — PROPOFOL 500 MG/50ML IV EMUL
INTRAVENOUS | Status: DC | PRN
  Administered 2024-04-02: 100 ug/kg/min via INTRAVENOUS

## 2024-04-02 MED ORDER — PROPOFOL 10 MG/ML IV BOLUS
INTRAVENOUS | Status: DC | PRN
  Administered 2024-04-02 (×2): 50 mg via INTRAVENOUS

## 2024-04-02 MED ORDER — LACTATED RINGERS IV SOLN: INTRAVENOUS | Status: DC | PRN

## 2024-04-02 MED ORDER — PROPOFOL 10 MG/ML IV BOLUS: INTRAVENOUS | Status: DC | PRN

## 2024-04-02 NOTE — Anesthesia Postprocedure Evaluation (Signed)
 Anesthesia Post Note  Patient: Ann Giles  Procedure(s) Performed: COLONOSCOPY  Patient location during evaluation: PACU Anesthesia Type: General Level of consciousness: awake and alert Pain management: pain level controlled Vital Signs Assessment: post-procedure vital signs reviewed and stable Respiratory status: spontaneous breathing, nonlabored ventilation, respiratory function stable and patient connected to nasal cannula oxygen Cardiovascular status: stable and blood pressure returned to baseline Postop Assessment: no apparent nausea or vomiting Anesthetic complications: no   No notable events documented.   Last Vitals:  Vitals:   04/02/24 1034 04/02/24 1223  BP: (!) 197/85 (!) 192/95  Pulse: 62 73  Resp: 12 (!) 27  Temp: 36.7 C 36.8 C  SpO2: 97% 95%    Last Pain:  Vitals:   04/02/24 1223  TempSrc: Oral  PainSc:                  [name]

## 2024-04-02 NOTE — H&P (Signed)
 Primary Care Physician:  Gilmore Laroche, FNP Primary Gastroenterologist:  Dr. Marletta Lor  Pre-Procedure History & Physical: HPI:  Ann Giles is a 53 y.o. female is here for a colonoscopy to be performed for chronic abdominal pain, rectal bleeding, recurrent diverticulitis, diarrhea.  Past Medical History:  Diagnosis Date   Arthritis    Asthma    Bronchitis    CHF (congestive heart failure) (HCC)    Depression    Diverticulitis    GERD (gastroesophageal reflux disease)    Hypertension    OSA (obstructive sleep apnea)     Past Surgical History:  Procedure Laterality Date   ABDOMINAL HYSTERECTOMY     BIOPSY  06/01/2022   Procedure: BIOPSY;  Surgeon: Kerin Salen, MD;  Location: [facility];  Service: Gastroenterology;;   CHOLECYSTECTOMY     COLONOSCOPY WITH PROPOFOL N/A 06/01/2022   Procedure: COLONOSCOPY WITH PROPOFOL;  Surgeon: Kerin Salen, MD;  Location: [facility];  Service: Gastroenterology;  Laterality: N/A;   ESOPHAGOGASTRODUODENOSCOPY (EGD) WITH PROPOFOL N/A 06/01/2022   Procedure: ESOPHAGOGASTRODUODENOSCOPY (EGD) WITH PROPOFOL;  Surgeon: Kerin Salen, MD;  Location: [facility];  Service: Gastroenterology;  Laterality: N/A;   GASTROPLASTY     POLYPECTOMY  06/01/2022   Procedure: POLYPECTOMY;  Surgeon: Kerin Salen, MD;  Location: [facility];  Service: Gastroenterology;;    Prior to Admission medications   Medication Sig Start Date End Date Taking? Authorizing Provider  acetaminophen (TYLENOL) 500 MG tablet Take 1 tablet (500 mg total) by mouth every 6 (six) hours as needed. 02/15/23  Yes Gilmore Laroche, FNP  albuterol (PROVENTIL HFA;VENTOLIN HFA) 108 (90 Base) MCG/ACT inhaler Inhale 2 puffs into the lungs every 6 (six) hours as needed for wheezing or shortness of breath.   Yes [provider]  dicyclomine (BENTYL) 10 MG capsule Take 1 capsule (10 mg total) by mouth 4 (four) times daily -  before meals and at bedtime. 02/29/24 05/29/24 Yes Bradie Lacock,  Hennie Duos, DO  diphenhydrAMINE (BENADRYL) 25 MG tablet Take 25 mg by mouth every 6 (six) hours as needed for allergies.   Yes [provider]  OXYGEN Inhale 2 L into the lungs See admin instructions. Uses as needed during the day and nightly on schedule with BIPAP   Yes [provider]  phenylephrine-shark liver oil-mineral oil-petrolatum (PREPARATION H) 0.25-14-74.9 % rectal ointment Place 1 application rectally 2 (two) times daily as needed for hemorrhoids.   Yes [provider]  telmisartan (MICARDIS) 40 MG tablet Take 1 tablet (40 mg total) by mouth daily. 03/19/24  Yes Gilmore Laroche, FNP  Zinc Oxide (DESITIN) 13 % CREA Apply 1 application  topically daily as needed (Helps with diarrhea irritation).   Yes [provider]  furosemide (LASIX) 40 MG tablet Take 1 tablet (40 mg total) by mouth 2 (two) times daily. May take additional 20 mg for swelling Daily Patient taking differently: Take 40 mg by mouth daily. May take additional 20 mg for swelling Daily 11/04/20 03/19/24  Vassie Loll, MD  Ipratropium-Albuterol (COMBIVENT) 20-100 MCG/ACT AERS respimat Inhale 1 puff into the lungs every 6 (six) hours as needed for wheezing. Patient taking differently: Inhale 1 puff into the lungs every 6 (six) hours as needed (Bronchitis). 07/13/21 03/19/24  ShahmehdiGemma Payor, MD  omeprazole (PRILOSEC) 40 MG capsule Take 1 capsule (40 mg total) by mouth daily. Patient taking differently: Take 20 mg by mouth daily as needed (GERD). 07/13/21   Kendell Bane, MD    Allergies as of 03/06/2024   (No  Known Allergies)    Family History  Problem Relation Age of Onset   Breast cancer Maternal Aunt    Breast cancer Maternal Aunt    Asthma Neg Hx     Social History   Socioeconomic History   Marital status: Widowed    Spouse name: Not on file   Number of children: Not on file   Years of education: Not on file   Highest education level: Not on file  Occupational History    Not on file  Tobacco Use   Smoking status: Never   Smokeless tobacco: Never  Vaping Use   Vaping status: Not on file  Substance and Sexual Activity   Alcohol use: No   Drug use: No   Sexual activity: Not on file  Other Topics Concern   Not on file  Social History Narrative   Not on file   Social Drivers of Health   Financial Resource Strain: Not on file  Food Insecurity: No Food Insecurity (03/13/2021)   Received from [facility], [facility]   Hunger Vital Sign    Worried About Running Out of Food in the Last Year: Never true    Ran Out of Food in the Last Year: Never true  Transportation Needs: Not on file  Physical Activity: Not on file  Stress: Not on file  Social Connections: Unknown (05/02/2022)   Received from [facility], [facility]   Social Network    Social Network: Not on file  Intimate Partner Violence: Unknown (03/24/2022)   Received from [facility], [facility]   HITS    Physically Hurt: Not on file    Insult or Talk Down To: Not on file    Threaten Physical Harm: Not on file    Scream or Curse: Not on file    Review of Systems: See HPI, otherwise negative ROS  Physical Exam: Vital signs in last 24 hours: Temp:  [98.1 F (36.7 C)] 98.1 F (36.7 C) (04/14 1034) Pulse Rate:  [62] 62 (04/14 1034) Resp:  [12] 12 (04/14 1034) BP: (197)/(85) 197/85 (04/14 1034) SpO2:  [97 %] 97 % (04/14 1034) Weight:  [165.6 kg] 165.6 kg (04/14 1034)   General:   Alert,  Well-developed, well-nourished, pleasant and cooperative in NAD Head:  Normocephalic and atraumatic. Eyes:  Sclera clear, no icterus.   Conjunctiva pink. Ears:  Normal auditory acuity. Nose:  No deformity, discharge,  or lesions. Msk:  Symmetrical without gross deformities. Normal posture. Extremities:  Without clubbing or edema. Neurologic:  Alert and  oriented x4;  grossly normal neurologically. Skin:  Intact without significant lesions or rashes. Psych:  Alert and cooperative.  Normal mood and affect.  Impression/Plan: Ann Giles is here for a colonoscopy to be performed for chronic abdominal pain, rectal bleeding, recurrent diverticulitis, diarrhea.  The risks of the procedure including infection, bleed, or perforation as well as benefits, limitations, alternatives and imponderables have been reviewed with the patient. Questions have been answered. All parties agreeable.

## 2024-04-02 NOTE — Op Note (Signed)
 [facility] Patient Name: Ann Giles Procedure Date: 04/02/2024 11:45 AM MRN: [id number] Date of Birth: Jul 22, 1971 Attending MD: Hennie Duos. Marletta Lor , [place], [id number] CSN: [id number] Age: 53 Admit Type: Outpatient Procedure:                Colonoscopy Indications:              Chronic diarrhea, Rectal bleeding, Follow-up of                            diverticulitis Providers:                Hennie Duos. Marletta Lor, DO, Crystal Page, Lennice Sites                            Technician, Technician Referring MD:              Medicines:                See the Anesthesia note for documentation of the                            administered medications Complications:            No immediate complications. Estimated Blood Loss:     Estimated blood loss was minimal. Procedure:                Pre-Anesthesia Assessment:                           - The anesthesia plan was to use monitored                            anesthesia care (MAC).                           After obtaining informed consent, the colonoscope                            was passed under direct vision. Throughout the                            procedure, the patient's blood pressure, pulse, and                            oxygen saturations were monitored continuously. The                            [id number]-HQ190L ([id number]) scope was introduced through                            the anus and advanced to the the terminal ileum,                            with identification of the appendiceal orifice and                            IC valve. The colonoscopy was performed without  difficulty. The patient tolerated the procedure                            well. The quality of the bowel preparation was                            evaluated using the BBPS [facility] Bowel Preparation                            Scale) with scores of: Right Colon = 3, Transverse                            Colon = 3 and Left  Colon = 3 (entire mucosa seen                            well with no residual staining, small fragments of                            stool or opaque liquid). The total BBPS score                            equals 9. Scope In: 12:03:28 PM Scope Out: 12:17:11 PM Scope Withdrawal Time: 0 hours 11 minutes 19 seconds  Total Procedure Duration: 0 hours 13 minutes 43 seconds  Findings:      Non-bleeding internal hemorrhoids were found.      Hypertrophied anal papillae found in rectum      Lymphoid hyperplasia was found in the terminal ileum.      Biopsies for histology were taken with a cold forceps from the ascending       colon, transverse colon and descending colon for evaluation of       microscopic colitis.      Multiple medium-mouthed and small-mouthed diverticula were found in the       sigmoid colon. DIVERTICULITIS HAS HEALED Impression:               - Non-bleeding internal hemorrhoids.                           - Diverticulosis in the sigmoid colon.                           - Biopsies were taken with a cold forceps from the                            ascending colon, transverse colon and descending                            colon for evaluation of microscopic colitis. Moderate Sedation:      Per Anesthesia Care Recommendation:           - Patient has a contact number available for                            emergencies. The signs and symptoms of potential  delayed complications were discussed with the                            patient. Return to normal activities tomorrow.                            Written discharge instructions were provided to the                            patient.                           - Resume previous diet.                           - Continue present medications.                           - Await pathology results.                           - Repeat colonoscopy in 7-10 years for screening                             purposes given tubular adenoma prior                           - Return to GI clinic in 6 weeks. Consider                            hemorrhoid banding Procedure Code(s):        --- Professional ---                           [phone], Colonoscopy, flexible; with biopsy, single                            or multiple Diagnosis Code(s):        --- Professional ---                           K64.8, Other hemorrhoids                           K52.9, Noninfective gastroenteritis and colitis,                            unspecified                           K62.5, Hemorrhage of anus and rectum                           K57.32, Diverticulitis of large intestine without                            perforation or abscess without bleeding  K57.30, Diverticulosis of large intestine without                            perforation or abscess without bleeding CPT copyright 2022 American Medical Association. All rights reserved. The codes documented in this report are preliminary and upon coder review may  be revised to meet current compliance requirements. [name]. [name], DO [name]. [name], DO 04/02/2024 12:23:01 PM This report has been signed electronically. Number of Addenda: 0

## 2024-04-02 NOTE — Transfer of Care (Signed)
 Immediate Anesthesia Transfer of Care Note  Patient: Ann Giles  Procedure(s) Performed: COLONOSCOPY  Patient Location: PACU and Endoscopy Unit  Anesthesia Type:MAC  Level of Consciousness: awake and alert   Airway & Oxygen Therapy: Patient Spontanous Breathing and Patient connected to nasal cannula oxygen  Post-op Assessment: Report given to RN and Post -op Vital signs reviewed and stable  Post vital signs: Reviewed and stable  Last Vitals:  Vitals Value Taken Time  BP    Temp    Pulse    Resp    SpO2      Last Pain:  Vitals:   04/02/24 1155  TempSrc:   PainSc: 0-No pain         Complications: No notable events documented.

## 2024-04-02 NOTE — Anesthesia Preprocedure Evaluation (Addendum)
 Anesthesia Evaluation  Patient identified by MRN, date of birth, ID band Patient awake    Airway Mallampati: III  TM Distance: >3 FB     Dental no notable dental hx. (+) Teeth Intact   Pulmonary asthma , sleep apnea , pneumonia          Cardiovascular hypertension, +CHF   Rhythm:Regular Rate:Normal     Neuro/Psych  PSYCHIATRIC DISORDERS  Depression       GI/Hepatic ,GERD  ,,  Endo/Other    Renal/GU   negative genitourinary   Musculoskeletal  (+) Arthritis ,    Abdominal  (+) + obese  Bowel sounds: normal.  Peds  Hematology   Anesthesia Other Findings   Reproductive/Obstetrics                              Anesthesia Physical Anesthesia Plan  ASA: 4  Anesthesia Plan: General   Post-op Pain Management:    Induction: Intravenous  PONV Risk Score and Plan: 1 and Propofol infusion  Airway Management Planned: Nasal Cannula  Additional Equipment:   Intra-op Plan:   Post-operative Plan:   Informed Consent: I have reviewed the patients History and Physical, chart, labs and discussed the procedure including the risks, benefits and alternatives for the proposed anesthesia with the patient or authorized representative who has indicated his/her understanding and acceptance.       Plan Discussed with: CRNA and Surgeon  Anesthesia Plan Comments:          Anesthesia Quick Evaluation

## 2024-04-02 NOTE — Telephone Encounter (Signed)
 Mandy: patient needs follow-up with me in about 2 weeks. Thanks! Can be any spot.

## 2024-04-02 NOTE — Discharge Instructions (Addendum)
  Colonoscopy Discharge Instructions  Read the instructions outlined below and refer to this sheet in the next few weeks. These discharge instructions provide you with general information on caring for yourself after you leave the hospital. Your doctor may also give you specific instructions. While your treatment has been planned according to the most current medical practices available, unavoidable complications occasionally occur.   ACTIVITY You may resume your regular activity, but move at a slower pace for the next 24 hours.  Take frequent rest periods for the next 24 hours.  Walking will help get rid of the air and reduce the bloated feeling in your belly (abdomen).  No driving for 24 hours (because of the medicine (anesthesia) used during the test).   Do not sign any important legal documents or operate any machinery for 24 hours (because of the anesthesia used during the test).  NUTRITION Drink plenty of fluids.  You may resume your normal diet as instructed by your doctor.  Begin with a light meal and progress to your normal diet. Heavy or fried foods are harder to digest and may make you feel sick to your stomach (nauseated).  Avoid alcoholic beverages for 24 hours or as instructed.  MEDICATIONS You may resume your normal medications unless your doctor tells you otherwise.  WHAT YOU CAN EXPECT TODAY Some feelings of bloating in the abdomen.  Passage of more gas than usual.  Spotting of blood in your stool or on the toilet paper.  IF YOU HAD POLYPS REMOVED DURING THE COLONOSCOPY: No aspirin products for 7 days or as instructed.  No alcohol for 7 days or as instructed.  Eat a soft diet for the next 24 hours.  FINDING OUT THE RESULTS OF YOUR TEST Not all test results are available during your visit. If your test results are not back during the visit, make an appointment with your caregiver to find out the results. Do not assume everything is normal if you have not heard from your  caregiver or the medical facility. It is important for you to follow up on all of your test results.  SEEK IMMEDIATE MEDICAL ATTENTION IF: You have more than a spotting of blood in your stool.  Your belly is swollen (abdominal distention).  You are nauseated or vomiting.  You have a temperature over 101.  You have abdominal pain or discomfort that is severe or gets worse throughout the day.   Overall, your colon appeared very healthy.  I did not see any active inflammation indicative of underlying inflammatory bowel disease such as Crohn's disease or ulcerative colitis throughout your colon or end portion of your small bowel.  I took biopsies of your colon to further evaluate.  Await pathology results, my office will contact you.  I did not find any evidence of polyps.  Recommend repeat colonoscopy in 7 to 10 years.  You do have diverticulosis and internal hemorrhoids. I would recommend increasing fiber in your diet or adding OTC Benefiber/Metamucil. Be sure to drink at least 4 to 6 glasses of water daily.  No evidence of active diverticulitis on today's exam  We can consider hemorrhoid banding in the office to treat your hemorrhoids/rectal bleeding.   Follow-up with GI in 6-8 weeks  I hope you have a great rest of your week!  Hennie Duos. Marletta Lor, D.O. Gastroenterology and Hepatology [facility]

## 2024-04-03 ENCOUNTER — Encounter (HOSPITAL_COMMUNITY): Payer: Self-pay | Admitting: Internal Medicine

## 2024-04-04 LAB — SURGICAL PATHOLOGY

## 2024-04-08 ENCOUNTER — Ambulatory Visit
Admission: RE | Admit: 2024-04-08 | Discharge: 2024-04-08 | Disposition: A | Discharge status: Home / Self Care | Source: Ambulatory Visit | Attending: Family Medicine | Admitting: Family Medicine

## 2024-04-08 ENCOUNTER — Telehealth: Payer: Self-pay | Admitting: Emergency Medicine

## 2024-04-08 VITALS — BP 198/115 | HR 74 | Temp 99.0°F | Resp 22

## 2024-04-08 DIAGNOSIS — R220 Localized swelling, mass and lump, head: Secondary | ICD-10-CM | POA: Diagnosis not present

## 2024-04-08 DIAGNOSIS — K047 Periapical abscess without sinus: Secondary | ICD-10-CM | POA: Diagnosis not present

## 2024-04-08 DIAGNOSIS — R030 Elevated blood-pressure reading, without diagnosis of hypertension: Secondary | ICD-10-CM

## 2024-04-08 MED ORDER — DEXAMETHASONE SODIUM PHOSPHATE 10 MG/ML IJ SOLN
10.0000 mg | Freq: Once | INTRAMUSCULAR | Status: AC
  Administered 2024-04-08: 10 mg via INTRAMUSCULAR

## 2024-04-08 MED ORDER — CLINDAMYCIN HCL 300 MG PO CAPS: 300.0000 mg | ORAL_CAPSULE | Freq: Two times a day (BID) | ORAL | 0 refills | Status: DC

## 2024-04-08 MED ORDER — LIDOCAINE VISCOUS HCL 2 % MT SOLN: 10.0000 mL | OROMUCOSAL | 0 refills | Status: DC | PRN

## 2024-04-08 MED ORDER — LIDOCAINE VISCOUS HCL 2 % MT SOLN
10.0000 mL | OROMUCOSAL | 0 refills | Status: DC | PRN
Start: 2024-04-08 — End: 2024-05-09

## 2024-04-08 NOTE — Discharge Instructions (Signed)
 We have given you a steroid shot today to help with inflammation and change her antibiotic to clindamycin .  You may also use the viscous lidocaine , you may soak a cotton swab inside in the mouth where your pain is.  Follow-up with a dentist soon as possible for recheck, go to the emergency department if symptoms are severely worsening anytime.

## 2024-04-08 NOTE — Telephone Encounter (Signed)
 Pt called and reported "[facility] on [address] is unable to fill prescription" and reports "there is a sign on their door that states all prescriptions need to be sent to [facility] site in [place]." Discharge prescriptions electronically sent to [facility] on [address] in [place]. Pt appreciative. Provider aware.

## 2024-04-08 NOTE — ED Provider Notes (Signed)
[facility]    CSN: [id number] Arrival date & time: 04/08/24  1140      History   Chief Complaint Chief Complaint  Patient presents with   Allergic Reaction    HPI Ann Giles is a 53 y.o. female.   Patient presenting today with several day history of progressively worsening right lower dental pain and now facial swelling.  Denies difficulty breathing or swallowing, fever, bleeding or drainage from the area.  States her dentist gave her amoxicillin  and she has been taking this for 1 day with no relief, symptoms continue to worsen.    Past Medical History:  Diagnosis Date   Arthritis    Asthma    Bronchitis    CHF (congestive heart failure) (HCC)    Depression    Diverticulitis    GERD (gastroesophageal reflux disease)    Hypertension    OSA (obstructive sleep apnea)     Patient Active Problem List   Diagnosis Date Noted   Chronic abdominal pain 02/03/2024   Tendinitis of right wrist 02/15/2023   Diverticulitis 02/15/2023   Gout 02/15/2023   COVID-19 virus infection 07/09/2021   Hypoxia    Obesity hypoventilation syndrome (HCC)    Gastroesophageal reflux disease    OSA treated with BiPAP 10/31/2020   Community acquired pneumonia    Acute on chronic diastolic CHF (congestive heart failure) (HCC) 06/23/2019   Acute diastolic CHF (congestive heart failure) (HCC) 12/23/2017   Obesity, Class III, BMI 40-49.9 (morbid obesity) (HCC) 12/23/2017   Acute bronchospasm 09/09/2016   Asthma 09/09/2016   Hypertension 09/09/2016   Depression 09/09/2016   Leg swelling 09/09/2016   Acute respiratory failure with hypoxia (HCC) 09/09/2016   Bronchospasm, acute 09/09/2016    Past Surgical History:  Procedure Laterality Date   ABDOMINAL HYSTERECTOMY     BIOPSY  06/01/2022   Procedure: BIOPSY;  Surgeon: Genell Ken, MD;  Location: [facility];  Service: Gastroenterology;;   CHOLECYSTECTOMY     COLONOSCOPY N/A 04/02/2024   Procedure: COLONOSCOPY;   Surgeon: Vinetta Greening, DO;  Location: [facility];  Service: Endoscopy;  Laterality: N/A;  11:30 am, asa 3   COLONOSCOPY WITH PROPOFOL  N/A 06/01/2022   Procedure: COLONOSCOPY WITH PROPOFOL ;  Surgeon: Genell Ken, MD;  Location: [facility];  Service: Gastroenterology;  Laterality: N/A;   ESOPHAGOGASTRODUODENOSCOPY (EGD) WITH PROPOFOL  N/A 06/01/2022   Procedure: ESOPHAGOGASTRODUODENOSCOPY (EGD) WITH PROPOFOL ;  Surgeon: Genell Ken, MD;  Location: [facility];  Service: Gastroenterology;  Laterality: N/A;   GASTROPLASTY     POLYPECTOMY  06/01/2022   Procedure: POLYPECTOMY;  Surgeon: Genell Ken, MD;  Location: [facility];  Service: Gastroenterology;;    OB History   No obstetric history on file.      Home Medications    Prior to Admission medications   Medication Sig Start Date End Date Taking? Authorizing Provider  clindamycin  (CLEOCIN ) 300 MG capsule Take 1 capsule (300 mg total) by mouth 2 (two) times daily. 04/08/24  Yes Corbin Dess, PA-C  lidocaine  (XYLOCAINE ) 2 % solution Use as directed 10 mLs in the mouth or throat every 3 (three) hours as needed. 04/08/24  Yes Corbin Dess, PA-C  acetaminophen  (TYLENOL ) 500 MG tablet Take 1 tablet (500 mg total) by mouth every 6 (six) hours as needed. 02/15/23   Zarwolo, Gloria, FNP  albuterol  (PROVENTIL  HFA;VENTOLIN  HFA) 108 (90 Base) MCG/ACT inhaler Inhale 2 puffs into the lungs every 6 (six) hours as needed for wheezing or shortness of breath.  [provider]  dicyclomine  (BENTYL ) 10 MG capsule Take 1 capsule (10 mg total) by mouth 4 (four) times daily -  before meals and at bedtime. 02/29/24 05/29/24  Carver, Charles K, DO  diphenhydrAMINE (BENADRYL) 25 MG tablet Take 25 mg by mouth every 6 (six) hours as needed for allergies.    [provider]  furosemide  (LASIX ) 40 MG tablet Take 1 tablet (40 mg total) by mouth 2 (two) times daily. May take additional 20 mg for swelling Daily Patient taking  differently: Take 40 mg by mouth daily. May take additional 20 mg for swelling Daily 11/04/20 03/19/24  Justina Oman, MD  Ipratropium-Albuterol  (COMBIVENT ) 20-100 MCG/ACT AERS respimat Inhale 1 puff into the lungs every 6 (six) hours as needed for wheezing. Patient taking differently: Inhale 1 puff into the lungs every 6 (six) hours as needed (Bronchitis). 07/13/21 03/19/24  Bobbetta Burnet, MD  omeprazole  (PRILOSEC) 40 MG capsule Take 1 capsule (40 mg total) by mouth daily. Patient taking differently: Take 20 mg by mouth daily as needed (GERD). 07/13/21   Shahmehdi, Seyed A, MD  OXYGEN Inhale 2 L into the lungs See admin instructions. Uses as needed during the day and nightly on schedule with BIPAP    [provider]  phenylephrine-shark liver oil-mineral oil-petrolatum (PREPARATION H) 0.25-14-74.9 % rectal ointment Place 1 application rectally 2 (two) times daily as needed for hemorrhoids.    [provider]  telmisartan  (MICARDIS ) 40 MG tablet Take 1 tablet (40 mg total) by mouth daily. 03/19/24   Zarwolo, Gloria, FNP  Zinc  Oxide (DESITIN) 13 % CREA Apply 1 application  topically daily as needed (Helps with diarrhea irritation).    [provider]    Family History Family History  Problem Relation Age of Onset   Breast cancer Maternal Aunt    Breast cancer Maternal Aunt    Asthma Neg Hx     Social History Social History   Tobacco Use   Smoking status: Never   Smokeless tobacco: Never  Substance Use Topics   Alcohol use: No   Drug use: No     Allergies   Patient has no known allergies.   Review of Systems Review of Systems PER HPI  Physical Exam Triage Vital Signs ED Triage Vitals  Encounter Vitals Group     BP 04/08/24 1212 (!) 215/121     Systolic BP Percentile --      Diastolic BP Percentile --      Pulse Rate 04/08/24 1212 74     Resp 04/08/24 1212 (!) 22     Temp 04/08/24 1212 99 F (37.2 C)     Temp Source 04/08/24 1212 Oral      SpO2 04/08/24 1212 94 %     Weight --      Height --      Head Circumference --      Peak Flow --      Pain Score 04/08/24 1218 8     Pain Loc --      Pain Education --      Exclude from Growth Chart --    No data found.  Updated Vital Signs BP (!) 198/115   Pulse 74   Temp 99 F (37.2 C) (Oral)   Resp (!) 22   SpO2 94%   Visual Acuity Right Eye Distance:   Left Eye Distance:   Bilateral Distance:    Right Eye Near:   Left Eye Near:    Bilateral Near:  Physical Exam Vitals and nursing note reviewed.  Constitutional:      Appearance: Normal appearance. She is not ill-appearing.  HENT:     Head: Atraumatic.     Mouth/Throat:     Mouth: Mucous membranes are moist.     Comments: Right lower jaw gingival erythema, edema.  Coordinating significant facial swelling to this side.  Oral airway patent Eyes:     Extraocular Movements: Extraocular movements intact.     Conjunctiva/sclera: Conjunctivae normal.  Cardiovascular:     Rate and Rhythm: Normal rate and regular rhythm.     Heart sounds: Normal heart sounds.  Pulmonary:     Effort: Pulmonary effort is normal.     Breath sounds: Normal breath sounds.  Musculoskeletal:        General: Normal range of motion.     Cervical back: Normal range of motion and neck supple.  Skin:    General: Skin is warm and dry.  Neurological:     Mental Status: She is alert and oriented to person, place, and time.     Motor: No weakness.     Gait: Gait normal.  Psychiatric:        Mood and Affect: Mood normal.        Thought Content: Thought content normal.        Judgment: Judgment normal.      UC Treatments / Results  Labs (all labs ordered are listed, but only abnormal results are displayed) Labs Reviewed - No data to display  EKG   Radiology No results found.  Procedures Procedures (including critical care time)  Medications Ordered in UC Medications  dexamethasone  (DECADRON ) injection 10 mg (10 mg  Intramuscular Given 04/08/24 1232)    Initial Impression / Assessment and Plan / UC Course  I have reviewed the triage vital signs and the nursing notes.  Pertinent labs & imaging results that were available during my care of the patient were reviewed by me and considered in my medical decision making (see chart for details).     Significantly hypertensive today, discussed close monitoring of this and following up with PCP if not resolving.  Continue home blood pressure regimen.  Given the extent of swelling we will treat with IM Decadron  in addition to changing to clindamycin  for antibiotic therapy as no improvement on the amoxicillin  and symptoms continuing to worsen.  Viscous lidocaine  for pain relief, close dental follow-up recommended.  ED for worsening symptoms.  Final Clinical Impressions(s) / UC Diagnoses   Final diagnoses:  Dental infection  Facial swelling  Elevated blood pressure reading     Discharge Instructions      We have given you a steroid shot today to help with inflammation and change her antibiotic to clindamycin .  You may also use the viscous lidocaine , you may soak a cotton swab inside in the mouth where your pain is.  Follow-up with a dentist soon as possible for recheck, go to the emergency department if symptoms are severely worsening anytime.    ED Prescriptions     Medication Sig Dispense Auth. Provider   clindamycin  (CLEOCIN ) 300 MG capsule Take 1 capsule (300 mg total) by mouth 2 (two) times daily. 14 capsule Corbin Dess, PA-C   lidocaine  (XYLOCAINE ) 2 % solution Use as directed 10 mLs in the mouth or throat every 3 (three) hours as needed. 100 mL Corbin Dess, [place]      PDMP not reviewed this encounter.   Corbin Dess, PA-C  04/08/24 1306  

## 2024-04-08 NOTE — ED Triage Notes (Signed)
 Pt reports to [facility] oral swelling on the right side of her mouth,  pain, and her lip is "twisted" x 1 day   States she was recently put on amoxacillin for a tooth problem. SXS started after taking amoxacillin

## 2024-04-17 ENCOUNTER — Emergency Department (HOSPITAL_COMMUNITY)
Admission: EM | Admit: 2024-04-17 | Discharge: 2024-04-18 | Disposition: A | Discharge status: Home / Self Care | Attending: Emergency Medicine | Admitting: Emergency Medicine

## 2024-04-17 DIAGNOSIS — I110 Hypertensive heart disease with heart failure: Secondary | ICD-10-CM | POA: Insufficient documentation

## 2024-04-17 DIAGNOSIS — N200 Calculus of kidney: Secondary | ICD-10-CM

## 2024-04-17 DIAGNOSIS — J45909 Unspecified asthma, uncomplicated: Secondary | ICD-10-CM | POA: Diagnosis not present

## 2024-04-17 DIAGNOSIS — N202 Calculus of kidney with calculus of ureter: Secondary | ICD-10-CM | POA: Insufficient documentation

## 2024-04-17 DIAGNOSIS — I509 Heart failure, unspecified: Secondary | ICD-10-CM | POA: Insufficient documentation

## 2024-04-17 DIAGNOSIS — R1031 Right lower quadrant pain: Secondary | ICD-10-CM | POA: Diagnosis present

## 2024-04-17 NOTE — ED Triage Notes (Signed)
 Patient from home for RLQ abd pain that started about 3 hours ago. Reports nausea, mild constipation, and diaphoresis. Denies vomiting, diarrhea, and urinary changes. Patient does have a history of diverticulitis. Upon arrival to ER, patient is alert and oriented; pain is 10/10

## 2024-04-18 ENCOUNTER — Encounter (HOSPITAL_COMMUNITY): Payer: Self-pay

## 2024-04-18 ENCOUNTER — Emergency Department (HOSPITAL_COMMUNITY)

## 2024-04-18 ENCOUNTER — Ambulatory Visit: Admitting: Gastroenterology

## 2024-04-18 ENCOUNTER — Other Ambulatory Visit: Payer: Self-pay

## 2024-04-18 LAB — COMPREHENSIVE METABOLIC PANEL WITH GFR
ALT: 18 U/L (ref 0–44)
AST: 17 U/L (ref 15–41)
Albumin: 3.5 g/dL (ref 3.5–5.0)
Alkaline Phosphatase: 58 U/L (ref 38–126)
Anion gap: 7 (ref 5–15)
BUN: 11 mg/dL (ref 6–20)
CO2: 30 mmol/L (ref 22–32)
Calcium: 8.9 mg/dL (ref 8.9–10.3)
Chloride: 100 mmol/L (ref 98–111)
Creatinine, Ser: 1.27 mg/dL — ABNORMAL HIGH (ref 0.44–1.00)
GFR, Estimated: 51 mL/min — ABNORMAL LOW (ref >60)
Glucose, Bld: 151 mg/dL — ABNORMAL HIGH (ref 70–99)
Potassium: 3.1 mmol/L — ABNORMAL LOW (ref 3.5–5.1)
Sodium: 137 mmol/L (ref 135–145)
Total Bilirubin: 0.6 mg/dL (ref 0.0–1.2)
Total Protein: 7.3 g/dL (ref 6.5–8.1)

## 2024-04-18 LAB — CBC WITH DIFFERENTIAL/PLATELET
Abs Immature Granulocytes: 0.02 10*3/uL (ref 0.00–0.07)
Basophils Absolute: 0 10*3/uL (ref 0.0–0.1)
Basophils Relative: 0 %
Eosinophils Absolute: 0.1 10*3/uL (ref 0.0–0.5)
Eosinophils Relative: 1 %
HCT: 38.3 % (ref 36.0–46.0)
Hemoglobin: 12.1 g/dL (ref 12.0–15.0)
Immature Granulocytes: 0 %
Lymphocytes Relative: 28 %
Lymphs Abs: 1.8 10*3/uL (ref 0.7–4.0)
MCH: 31 pg (ref 26.0–34.0)
MCHC: 31.6 g/dL (ref 30.0–36.0)
MCV: 98.2 fL (ref 80.0–100.0)
Monocytes Absolute: 0.6 10*3/uL (ref 0.1–1.0)
Monocytes Relative: 9 %
Neutro Abs: 4 10*3/uL (ref 1.7–7.7)
Neutrophils Relative %: 62 %
Platelets: 236 10*3/uL (ref 150–400)
RBC: 3.9 MIL/uL (ref 3.87–5.11)
RDW: 13.3 % (ref 11.5–15.5)
WBC: 6.5 10*3/uL (ref 4.0–10.5)
nRBC: 0 % (ref 0.0–0.2)

## 2024-04-18 LAB — URINALYSIS, ROUTINE W REFLEX MICROSCOPIC
Bilirubin Urine: NEGATIVE
Glucose, UA: NEGATIVE mg/dL
Hgb urine dipstick: NEGATIVE
Ketones, ur: NEGATIVE mg/dL
Leukocytes,Ua: NEGATIVE
Nitrite: NEGATIVE
Protein, ur: NEGATIVE mg/dL
Specific Gravity, Urine: 1.01 (ref 1.005–1.030)
pH: 6 (ref 5.0–8.0)

## 2024-04-18 LAB — LIPASE, BLOOD: Lipase: 29 U/L (ref 11–51)

## 2024-04-18 MED ORDER — MORPHINE SULFATE (PF) 4 MG/ML IV SOLN
4.0000 mg | Freq: Once | INTRAVENOUS | Status: AC
  Administered 2024-04-18: 4 mg via INTRAVENOUS
  Filled 2024-04-18: qty 1

## 2024-04-18 MED ORDER — TAMSULOSIN HCL 0.4 MG PO CAPS: 0.4000 mg | ORAL_CAPSULE | Freq: Every day | ORAL | 0 refills | Status: DC

## 2024-04-18 MED ORDER — OXYCODONE-ACETAMINOPHEN 5-325 MG PO TABS: 1.0000 | ORAL_TABLET | Freq: Four times a day (QID) | ORAL | 0 refills | Status: AC | PRN

## 2024-04-18 MED ORDER — SODIUM CHLORIDE 0.9 % IV BOLUS
1000.0000 mL | Freq: Once | INTRAVENOUS | Status: AC
  Administered 2024-04-18: 1000 mL via INTRAVENOUS

## 2024-04-18 MED ORDER — KETOROLAC TROMETHAMINE 15 MG/ML IJ SOLN
15.0000 mg | Freq: Once | INTRAMUSCULAR | Status: AC
  Administered 2024-04-18: 15 mg via INTRAVENOUS
  Filled 2024-04-18: qty 1

## 2024-04-18 MED ORDER — OXYCODONE-ACETAMINOPHEN 5-325 MG PO TABS
1.0000 | ORAL_TABLET | Freq: Once | ORAL | Status: AC
  Administered 2024-04-18: 1 via ORAL
  Filled 2024-04-18: qty 1

## 2024-04-18 MED ORDER — ONDANSETRON HCL 4 MG/2ML IJ SOLN
4.0000 mg | Freq: Once | INTRAMUSCULAR | Status: AC
  Administered 2024-04-18: 4 mg via INTRAVENOUS
  Filled 2024-04-18: qty 2

## 2024-04-18 NOTE — ED Provider Notes (Signed)
 [place] EMERGENCY DEPARTMENT AT [facility] Provider Note   CSN: [id number] Arrival date & time: 04/17/24  2350     History  Chief Complaint  Patient presents with   Abdominal Pain    Glenn L [place] is a 53 y.o. female.  HPI     This is a 53 year old female who presents with right lower quadrant pain.  Patient reports acute onset of right lower quadrant pain approximately 3 hours prior to arrival.  It is sharp and occasionally radiates to the back.  She has had some nausea.  Reports that baseline she has some constipation.  No diarrhea, no dysuria or hematuria.  Patient has a history of both diverticulitis and kidney stones.  She did not take anything for her symptoms.  Home Medications Prior to Admission medications   Medication Sig Start Date End Date Taking? Authorizing Provider  oxyCODONE -acetaminophen  (PERCOCET/ROXICET) 5-325 MG tablet Take 1 tablet by mouth every 6 (six) hours as needed for severe pain (pain score 7-10). 04/18/24  Yes Delisia Mcquiston, Vonzella Guernsey, MD  tamsulosin  (FLOMAX ) 0.4 MG CAPS capsule Take 1 capsule (0.4 mg total) by mouth daily. 04/18/24  Yes Francisco Ostrovsky, Vonzella Guernsey, MD  acetaminophen  (TYLENOL ) 500 MG tablet Take 1 tablet (500 mg total) by mouth every 6 (six) hours as needed. 02/15/23   Zarwolo, Gloria, FNP  albuterol  (PROVENTIL  HFA;VENTOLIN  HFA) 108 (90 Base) MCG/ACT inhaler Inhale 2 puffs into the lungs every 6 (six) hours as needed for wheezing or shortness of breath.    [provider]  clindamycin  (CLEOCIN ) 300 MG capsule Take 1 capsule (300 mg total) by mouth 2 (two) times daily. 04/08/24   Corbin Dess, PA-C  dicyclomine  (BENTYL ) 10 MG capsule Take 1 capsule (10 mg total) by mouth 4 (four) times daily -  before meals and at bedtime. 02/29/24 05/29/24  Carver, Charles K, DO  diphenhydrAMINE (BENADRYL) 25 MG tablet Take 25 mg by mouth every 6 (six) hours as needed for allergies.    [provider]  furosemide   (LASIX ) 40 MG tablet Take 1 tablet (40 mg total) by mouth 2 (two) times daily. May take additional 20 mg for swelling Daily Patient taking differently: Take 40 mg by mouth daily. May take additional 20 mg for swelling Daily 11/04/20 03/19/24  Justina Oman, MD  Ipratropium-Albuterol  (COMBIVENT ) 20-100 MCG/ACT AERS respimat Inhale 1 puff into the lungs every 6 (six) hours as needed for wheezing. Patient taking differently: Inhale 1 puff into the lungs every 6 (six) hours as needed (Bronchitis). 07/13/21 03/19/24  Bobbetta Burnet, MD  lidocaine  (XYLOCAINE ) 2 % solution Use as directed 10 mLs in the mouth or throat every 3 (three) hours as needed. 04/08/24   Corbin Dess, PA-C  omeprazole  (PRILOSEC) 40 MG capsule Take 1 capsule (40 mg total) by mouth daily. Patient taking differently: Take 20 mg by mouth daily as needed (GERD). 07/13/21   Shahmehdi, Seyed A, MD  OXYGEN Inhale 2 L into the lungs See admin instructions. Uses as needed during the day and nightly on schedule with BIPAP    [provider]  phenylephrine-shark liver oil-mineral oil-petrolatum (PREPARATION H) 0.25-14-74.9 % rectal ointment Place 1 application rectally 2 (two) times daily as needed for hemorrhoids.    [provider]  telmisartan  (MICARDIS ) 40 MG tablet Take 1 tablet (40 mg total) by mouth daily. 03/19/24   Zarwolo, Gloria, FNP  Zinc  Oxide (DESITIN) 13 % CREA Apply 1 application  topically daily as needed (Helps with diarrhea  irritation).    [provider]      Allergies    Amoxicillin     Review of Systems   Review of Systems  Constitutional:  Negative for fever.  Respiratory:  Negative for shortness of breath.   Cardiovascular:  Negative for chest pain.  Gastrointestinal:  Positive for abdominal pain.  Genitourinary:  Negative for dysuria and hematuria.  All other systems reviewed and are negative.   Physical Exam Updated Vital Signs BP (!) 155/81 (BP Location: Left Wrist)    Pulse (!) 49   Temp 97.7 F (36.5 C) (Oral)   Resp 18   Ht 1.727 m (5\' 8" )   Wt (!) 165.6 kg   SpO2 99%   BMI 55.50 kg/m  Physical Exam Vitals and nursing note reviewed.  Constitutional:      Appearance: She is well-developed. She is obese. She is not ill-appearing.  HENT:     Head: Normocephalic and atraumatic.  Eyes:     Pupils: Pupils are equal, round, and reactive to light.  Cardiovascular:     Rate and Rhythm: Normal rate and regular rhythm.     Heart sounds: Normal heart sounds.  Pulmonary:     Effort: Pulmonary effort is normal. No respiratory distress.     Breath sounds: No wheezing.  Abdominal:     General: Bowel sounds are normal.     Palpations: Abdomen is soft.     Tenderness: There is no abdominal tenderness.  Musculoskeletal:     Cervical back: Neck supple.  Skin:    General: Skin is warm and dry.  Neurological:     Mental Status: She is alert and oriented to person, place, and time.  Psychiatric:        Mood and Affect: Mood normal.     ED Results / Procedures / Treatments   Labs (all labs ordered are listed, but only abnormal results are displayed) Labs Reviewed  COMPREHENSIVE METABOLIC PANEL WITH GFR - Abnormal; Notable for the following components:      Result Value   Potassium 3.1 (*)    Glucose, Bld 151 (*)    Creatinine, Ser 1.27 (*)    GFR, Estimated 51 (*)    All other components within normal limits  CBC WITH DIFFERENTIAL/PLATELET  LIPASE, BLOOD  URINALYSIS, ROUTINE W REFLEX MICROSCOPIC    EKG None  Radiology CT Renal Stone Study Result Date: 04/18/2024 CLINICAL DATA:  Abdominal/flank pain, stone suspected EXAM: CT ABDOMEN AND PELVIS WITHOUT CONTRAST TECHNIQUE: Multidetector CT imaging of the abdomen and pelvis was performed following the standard protocol without IV contrast. RADIATION DOSE REDUCTION: This exam was performed according to the departmental dose-optimization program which includes automated exposure control, adjustment  of the mA and/or kV according to patient size and/or use of iterative reconstruction technique. COMPARISON:  CT abdomen pelvis 05/13/2023 FINDINGS: Lower chest: No acute abnormality. Hepatobiliary: No focal liver abnormality. Status post cholecystectomy. No biliary dilatation. Pancreas: No focal lesion. Normal pancreatic contour. No surrounding inflammatory changes. No main pancreatic ductal dilatation. Spleen: Normal in size without focal abnormality. Adrenals/Urinary Tract: No adrenal nodule bilaterally. Bilateral nephrolithiasis measuring up to 5 mm on the right and 6 mm on the left. Couple consecutive 5 mm and 2 mm calcified stone within the distal right ureter at the ureterovesicular junction. Fullness of the right collecting system. No left hydroureteronephrosis. No definite contour-deforming renal mass. No ureterolithiasis or hydroureter. The urinary bladder is unremarkable. Stomach/Bowel: Stomach is within normal limits. No evidence of small bowel  wall thickening or dilatation. Colonic diverticulosis. Question bowel wall thickening of the mid sigmoid colon with prominence of the pericolonic vasculature and lymph nodes. Appendix appears normal. Vascular/Lymphatic: No abdominal aorta or iliac aneurysm. No abdominal, pelvic, or inguinal lymphadenopathy. Reproductive: Status post hysterectomy. No adnexal masses. Other: No intraperitoneal free fluid. No intraperitoneal free gas. No organized fluid collection. Musculoskeletal: Small fat containing supraumbilical ventral hernia (6:101). Small fat containing umbilical hernia (6:90). No suspicious lytic or blastic osseous lesions. No acute displaced fracture. Laboratory listhesis of L5 on S1. IMPRESSION: 1. Minimally obstructive couple consecutive 5 mm and 2 mm right ureterovesicular junction stones. Nonobstructive bilateral nephrolithiasis measuring up to 6 mm. 2. Question bowel wall thickening of the sigmoid colon with prominence of the pericolonic vasculature and  lymph nodes. In the setting of colonic diverticulosis, this may represent developing or resolving mild acute diverticulitis. Recommend colonoscopy status post treatment and status post complete resolution of inflammatory changes to exclude an underlying lesion. Electronically Signed   By: Morgane  Naveau M.D.   On: 04/18/2024 01:03    Procedures Procedures    Medications Ordered in ED Medications  morphine (PF) 4 MG/ML injection 4 mg (4 mg Intravenous Given 04/18/24 0038)  ondansetron  (ZOFRAN ) injection 4 mg (4 mg Intravenous Given 04/18/24 0037)  sodium chloride  0.9 % bolus 1,000 mL (0 mLs Intravenous Stopped 04/18/24 0202)  ketorolac  (TORADOL ) 15 MG/ML injection 15 mg (15 mg Intravenous Given 04/18/24 0211)  oxyCODONE -acetaminophen  (PERCOCET/ROXICET) 5-325 MG per tablet 1 tablet (1 tablet Oral Given 04/18/24 0224)    ED Course/ Medical Decision Making/ A&P                                 Medical Decision Making Amount and/or Complexity of Data Reviewed Labs: ordered. Radiology: ordered.  Risk Prescription drug management.   This patient presents to the ED for concern of abdominal pain, this involves an extensive number of treatment options, and is a complaint that carries with it a high risk of complications and morbidity.  I considered the following differential and admission for this acute, potentially life threatening condition.  The differential diagnosis includes appendicitis, kidney stone, cholecystitis, diverticulitis, UTI  MDM:    This is a 53 year old female who presents with abdominal pain.  Acute nature.  She is nontoxic and vital signs are reassuring.  Her abdominal exam is fairly benign.  Given acute onset of symptoms, would favor kidney stone.  Patient was given fluids, pain, nausea medication.  Labs obtained and reviewed and largely reassuring.  No evidence of UTI.  CT stone study does show 2 obstructing stones on the right.  Additionally she does have some thickening of  her colon which may suggest early or resolving diverticulitis.  She has no leukocytosis and no pain on abdominal exam.  Do not feel that this is the culprit of her current presentation.  She has a known history of diverticulitis.  Last colonoscopy was in 2023.  Discussed the results with the patient.  Will refer back to GI but do not feel she needs antibiotics at this time.  Will treat for kidney stone.  Urology follow-up provided.  (Labs, imaging, consults)  Labs: I Ordered, and personally interpreted labs.  The pertinent results include: CBC, CMP, lipase, urinalysis  Imaging Studies ordered: I ordered imaging studies including CT stone study I independently visualized and interpreted imaging. I agree with the radiologist interpretation  Additional history obtained from chart  review.  External records from outside source obtained and reviewed including colonoscopy records  Cardiac Monitoring: The patient was maintained on a cardiac monitor.  If on the cardiac monitor, I personally viewed and interpreted the cardiac monitored which showed an underlying rhythm of: Sinus  Reevaluation: After the interventions noted above, I reevaluated the patient and found that they have :improved  Social Determinants of Health:  lives independently  Disposition: Discharge  Co morbidities that complicate the patient evaluation  Past Medical History:  Diagnosis Date   Arthritis    Asthma    Bronchitis    CHF (congestive heart failure) (HCC)    Depression    Diverticulitis    GERD (gastroesophageal reflux disease)    Hypertension    OSA (obstructive sleep apnea)      Medicines Meds ordered this encounter  Medications   morphine (PF) 4 MG/ML injection 4 mg   ondansetron  (ZOFRAN ) injection 4 mg   sodium chloride  0.9 % bolus 1,000 mL   ketorolac  (TORADOL ) 15 MG/ML injection 15 mg   oxyCODONE -acetaminophen  (PERCOCET/ROXICET) 5-325 MG per tablet 1 tablet    Refill:  0   tamsulosin  (FLOMAX )  0.4 MG CAPS capsule    Sig: Take 1 capsule (0.4 mg total) by mouth daily.    Dispense:  30 capsule    Refill:  0   oxyCODONE -acetaminophen  (PERCOCET/ROXICET) 5-325 MG tablet    Sig: Take 1 tablet by mouth every 6 (six) hours as needed for severe pain (pain score 7-10).    Dispense:  10 tablet    Refill:  0    I have reviewed the patients home medicines and have made adjustments as needed  Problem List / ED Course: Problem List Items Addressed This Visit   None Visit Diagnoses       Kidney stone    -  Primary   Relevant Medications   morphine (PF) 4 MG/ML injection 4 mg (Completed)   oxyCODONE -acetaminophen  (PERCOCET/ROXICET) 5-325 MG per tablet 1 tablet (Completed)   oxyCODONE -acetaminophen  (PERCOCET/ROXICET) 5-325 MG tablet                   Final Clinical Impression(s) / ED Diagnoses Final diagnoses:  Kidney stone    Rx / DC Orders ED Discharge Orders          Ordered    tamsulosin  (FLOMAX ) 0.4 MG CAPS capsule  Daily        04/18/24 0254    oxyCODONE -acetaminophen  (PERCOCET/ROXICET) 5-325 MG tablet  Every 6 hours PRN        04/18/24 0254              Rory Collard, MD 04/18/24 [phone]

## 2024-04-18 NOTE — ED Notes (Signed)
 Patient placed on 2L via [place] due to low SpO2. Patient reports she wears 2L at baseline at night. SpO2 improved with 2L

## 2024-04-18 NOTE — ED Notes (Signed)
 Patient transported to CT

## 2024-04-18 NOTE — Discharge Instructions (Addendum)
 You were seen today for flank pain.  Your CT scan does show obstructing kidney stones on the right.  This is likely the cause of your pain.  Make sure that you are staying hydrated.  Take medications as prescribed.  Of note on your CT scan you did have some thickening of the colon which could indicate some diverticular inflammation.  I favor the cause of your symptoms today to be your kidney stones.  However, you should follow-up with gastroenterology for possible repeat colonoscopy.

## 2024-04-27 ENCOUNTER — Other Ambulatory Visit: Payer: Self-pay | Admitting: Family Medicine

## 2024-05-03 ENCOUNTER — Other Ambulatory Visit: Payer: Self-pay | Admitting: Family Medicine

## 2024-05-08 IMAGING — RF DG ESOPHAGUS
5 series · 14 of 24 positions shown · non-contrast
Comparison: CT chest 10/31/2020.

CLINICAL DATA: Esophageal dysphagia, history of gastric stapling.

EXAM:
ESOPHOGRAM/BARIUM SWALLOW
TECHNIQUE: Single contrast examination was performed using thin and thick
barium.
FLUOROSCOPY:
Radiation Exposure Index (as provided by the fluoroscopic device):
75 MGy Kerma

[Series 1: sequence · 0.31mm/px · 1 of 5 frames shown (1 of 3)]
[frame 1/5]
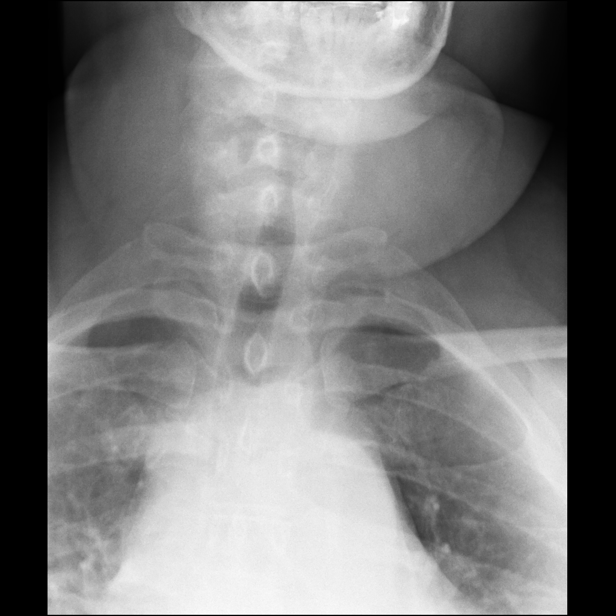

[Series 2: one shot · 1 of 1 slices shown (1 of 2)]
[im 1/1]
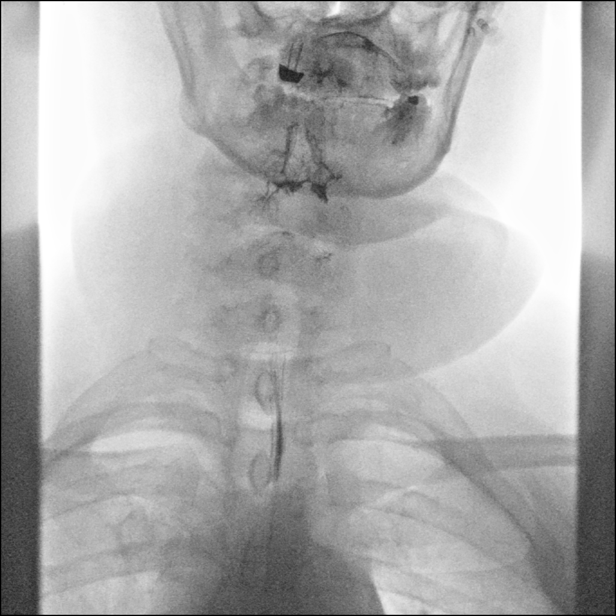

[Series 3: sequence · 0.31mm/px · 1 of 5 frames shown (2 of 3)]
[frame 3/5]
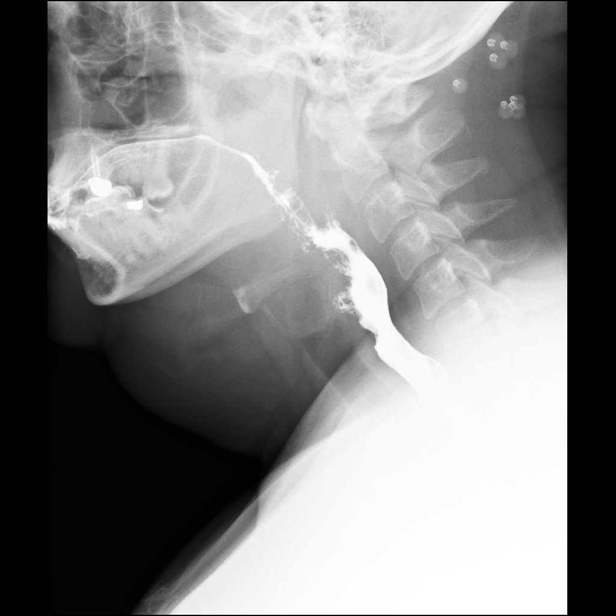

[Series 4: one shot · 9 of 25 slices shown (2 of 2)]
[im 2/25]
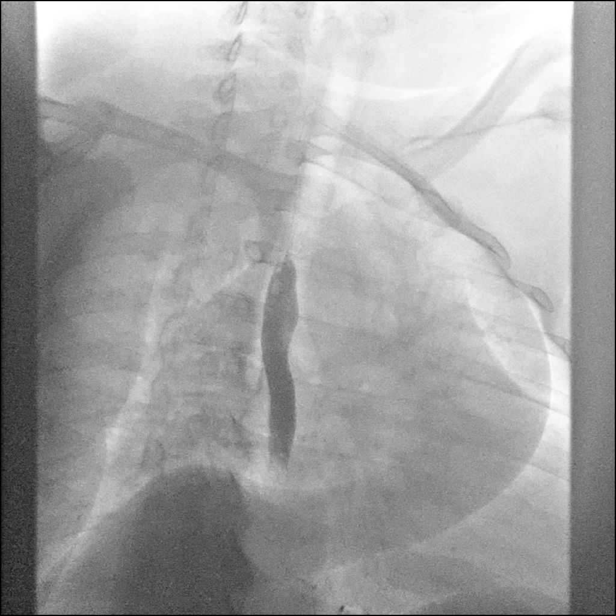
[im 3/25]
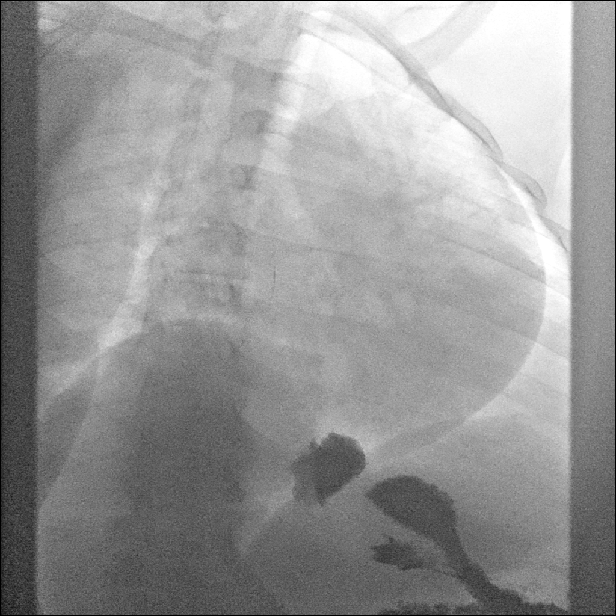
[im 7/25]
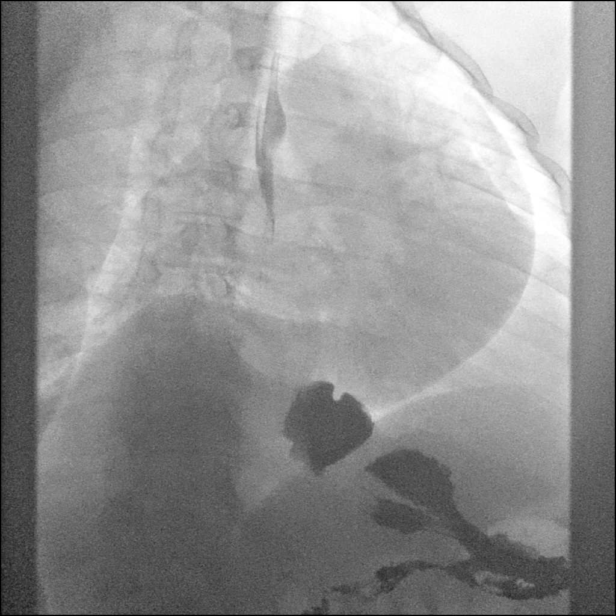
[im 9/25]
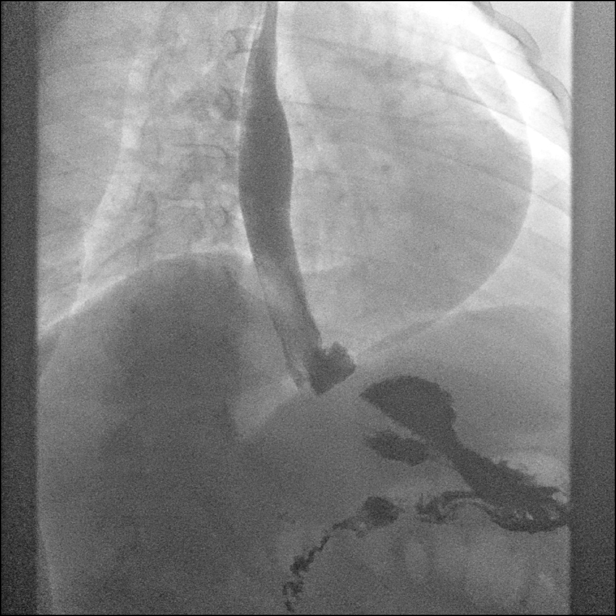
[im 12/25]
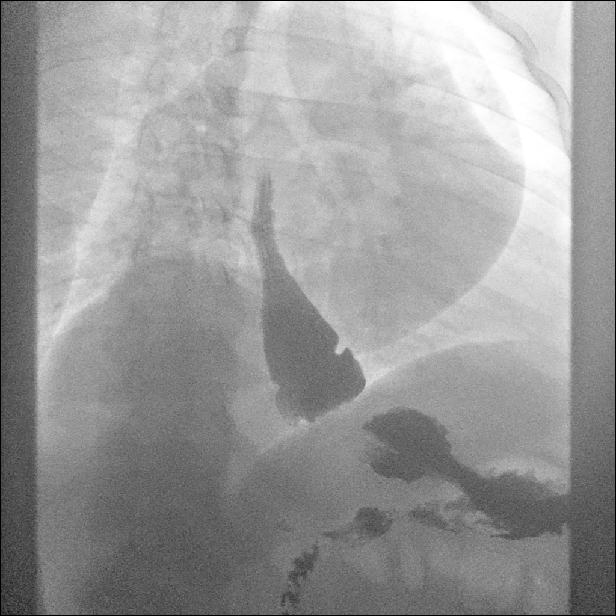
[im 14/25]
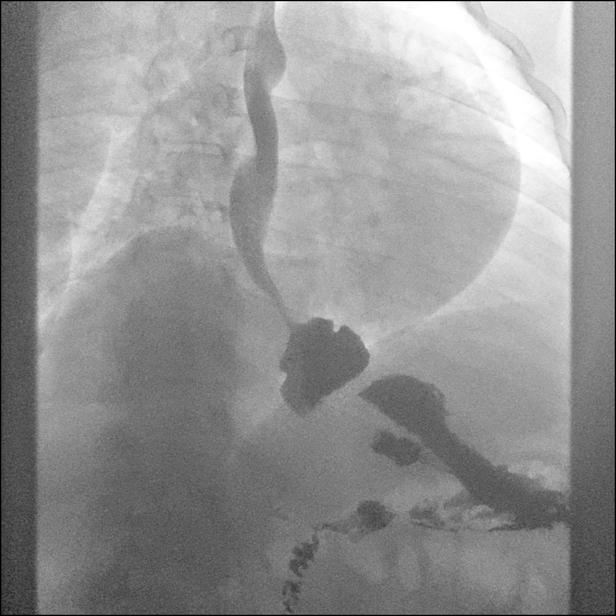
[im 18/25]
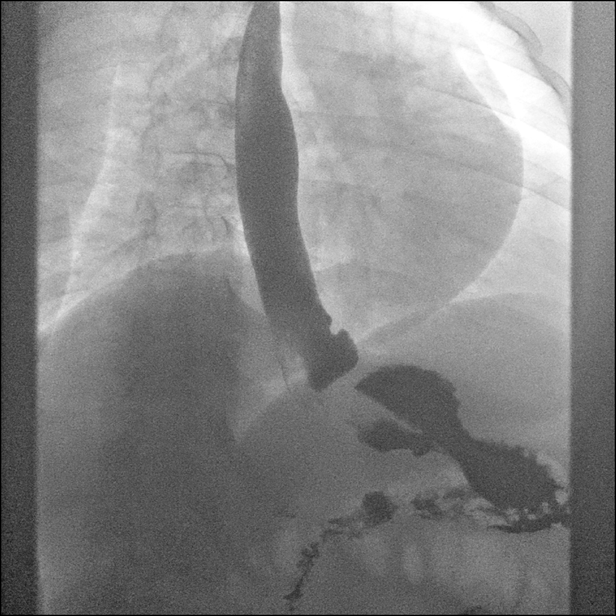
[im 21/25]
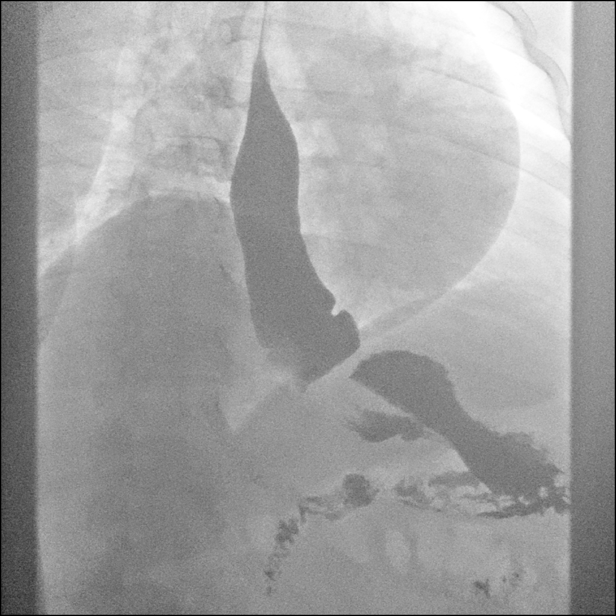
[im 23/25]
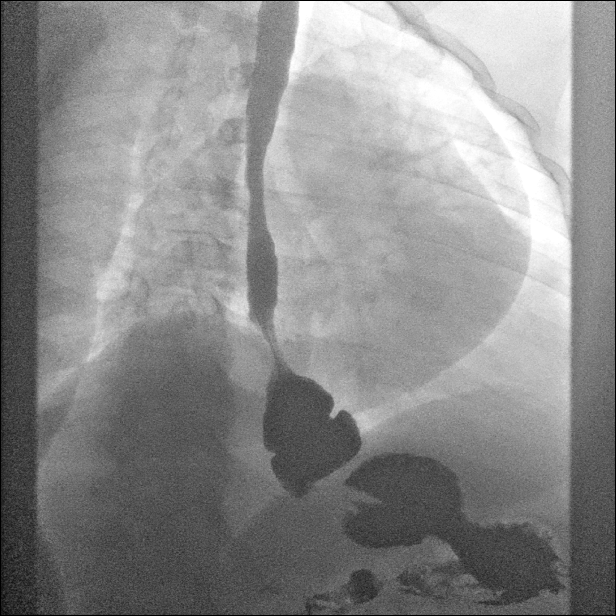

[Series 5: sequence · 2 of 13 frames shown (3 of 3)]
[frame 2/13]
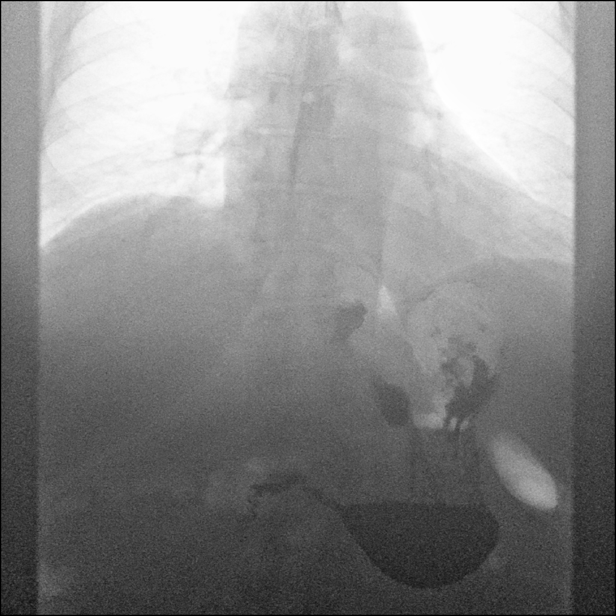
[frame 12/13]
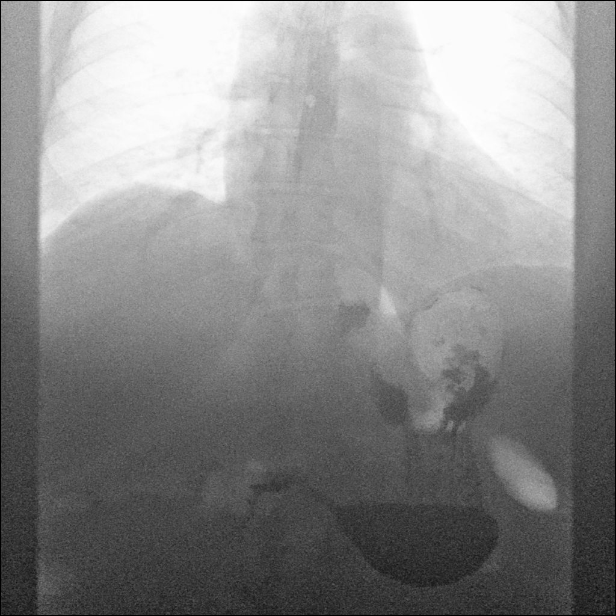

[14 of 24 positions shown; findings below may reference images not displayed]

FINDINGS: Normal swallowing mechanism. Normal esophageal motility. No
esophageal fold thickening, stricture or obstruction. Small hiatal
hernia. Patient is status post gastroplasty. A barium tablet would
not pass beyond the level of the gastroplasty sutures in the
proximal stomach. Gastroesophageal reflux was witnessed during the
exam.
IMPRESSION: 1. Narrowing at the gastroplasty suture/staple line in the proximal
stomach, through which a 13 mm barium tablet would not pass.
2. Gastroesophageal reflux.
3. Small hiatal hernia.

## 2024-05-09 ENCOUNTER — Encounter: Payer: Self-pay | Admitting: Gastroenterology

## 2024-05-09 ENCOUNTER — Ambulatory Visit (INDEPENDENT_AMBULATORY_CARE_PROVIDER_SITE_OTHER): Admitting: Gastroenterology

## 2024-05-09 VITALS — BP 155/83 | HR 73 | Temp 98.0°F | Ht 68.0 in | Wt 354.7 lb

## 2024-05-09 DIAGNOSIS — R197 Diarrhea, unspecified: Secondary | ICD-10-CM

## 2024-05-09 DIAGNOSIS — K529 Noninfective gastroenteritis and colitis, unspecified: Secondary | ICD-10-CM | POA: Insufficient documentation

## 2024-05-09 DIAGNOSIS — K641 Second degree hemorrhoids: Secondary | ICD-10-CM | POA: Insufficient documentation

## 2024-05-09 DIAGNOSIS — K648 Other hemorrhoids: Secondary | ICD-10-CM

## 2024-05-09 NOTE — Progress Notes (Unsigned)
 Gastroenterology Office Note     Primary Care Physician:  Zarwolo, Gloria, FNP  Primary Gastroenterologist:   Chief Complaint   Chief Complaint  Patient presents with   Follow-up    Pt arrives for follow up. Pt is wanting to know what treatment she can try for diverticulitis. No flare ups at this time or recently. Pt would also like to know about hemorrhoid banding.      History of Present Illness   Ann Giles is a 53 y.o. female presenting today with a history of recurrent diverticulitis (Oct 2023, May 2024)  Denies abdominal pain currently. Currently on abx for dental work. Hasn't had to take dicyclomine . BM averaging 3-4 times per day and loose, which is improved from prior as used to be postprandial urgency. Sometimes bloating after eating.   Cholecystectomy in the 1990s. Noted diarrhea starting after this. Notes increased diarrhea with fattier foods. Urgency with fatty foods.   No rectal bleeding currently. No rectal itching or burning.    Colonoscopy April 2025: non-bleeding internal hemorrhoids, sigmoid diverticulosis, segmental colon biopsies negative for microscopic colitis  colonoscopy 06/01/2022 with oozing hemorrhoids, normal TI, hypertrophied anal papillae, 1 polyp removed (tubular adenoma) of the descending colon, removed.    Past Medical History:  Diagnosis Date   Arthritis    Asthma    Bronchitis    CHF (congestive heart failure) (HCC)    Depression    Diverticulitis    GERD (gastroesophageal reflux disease)    Hypertension    OSA (obstructive sleep apnea)     Past Surgical History:  Procedure Laterality Date   ABDOMINAL HYSTERECTOMY     BIOPSY  06/01/2022   Procedure: BIOPSY;  Surgeon: Genell Ken, MD;  Location: [facility];  Service: Gastroenterology;;   CHOLECYSTECTOMY     COLONOSCOPY N/A 04/02/2024   Procedure: COLONOSCOPY;  Surgeon: Vinetta Greening, DO;  Location: [facility];  Service: Endoscopy;  Laterality: N/A;   11:30 am, asa 3   COLONOSCOPY WITH PROPOFOL  N/A 06/01/2022   Procedure: COLONOSCOPY WITH PROPOFOL ;  Surgeon: Genell Ken, MD;  Location: [facility];  Service: Gastroenterology;  Laterality: N/A;   ESOPHAGOGASTRODUODENOSCOPY (EGD) WITH PROPOFOL  N/A 06/01/2022   Procedure: ESOPHAGOGASTRODUODENOSCOPY (EGD) WITH PROPOFOL ;  Surgeon: Genell Ken, MD;  Location: [facility];  Service: Gastroenterology;  Laterality: N/A;   GASTROPLASTY     POLYPECTOMY  06/01/2022   Procedure: POLYPECTOMY;  Surgeon: Genell Ken, MD;  Location: [facility];  Service: Gastroenterology;;    Current Outpatient Medications  Medication Sig Dispense Refill   acetaminophen  (TYLENOL ) 500 MG tablet Take 1 tablet (500 mg total) by mouth every 6 (six) hours as needed. 30 tablet 0   albuterol  (PROVENTIL  HFA;VENTOLIN  HFA) 108 (90 Base) MCG/ACT inhaler Inhale 2 puffs into the lungs every 6 (six) hours as needed for wheezing or shortness of breath.     clindamycin  (CLEOCIN ) 300 MG capsule Take 1 capsule (300 mg total) by mouth 2 (two) times daily. 14 capsule 0   dicyclomine  (BENTYL ) 10 MG capsule Take 1 capsule (10 mg total) by mouth 4 (four) times daily -  before meals and at bedtime. 120 capsule 2   diphenhydrAMINE (BENADRYL) 25 MG tablet Take 25 mg by mouth every 6 (six) hours as needed for allergies.     furosemide  (LASIX ) 40 MG tablet Take 1 tablet (40 mg total) by mouth 2 (two) times daily. May take additional 20 mg for swelling Daily (Patient taking differently: Take 40 mg by mouth daily.  May take additional 20 mg for swelling Daily) 60 tablet 2   omeprazole  (PRILOSEC) 40 MG capsule Take 1 capsule (40 mg total) by mouth daily. (Patient taking differently: Take 20 mg by mouth daily as needed (GERD).) 30 capsule 2   oxyCODONE -acetaminophen  (PERCOCET/ROXICET) 5-325 MG tablet Take 1 tablet by mouth every 6 (six) hours as needed for severe pain (pain score 7-10). 10 tablet 0   OXYGEN Inhale 2 L into the lungs See admin instructions.  Uses as needed during the day and nightly on schedule with BIPAP     phenylephrine-shark liver oil-mineral oil-petrolatum (PREPARATION H) 0.25-14-74.9 % rectal ointment Place 1 application rectally 2 (two) times daily as needed for hemorrhoids.     tamsulosin  (FLOMAX ) 0.4 MG CAPS capsule Take 1 capsule (0.4 mg total) by mouth daily. 30 capsule 0   telmisartan  (MICARDIS ) 40 MG tablet Take 1 tablet (40 mg total) by mouth daily. 30 tablet 1   Zinc  Oxide (DESITIN) 13 % CREA Apply 1 application  topically daily as needed (Helps with diarrhea irritation).     No current facility-administered medications for this visit.    Allergies as of 05/09/2024 - Review Complete 05/09/2024  Allergen Reaction Noted   Amoxicillin   04/18/2024    Family History  Problem Relation Age of Onset   Breast cancer Maternal Aunt    Breast cancer Maternal Aunt    Asthma Neg Hx     Social History   Socioeconomic History   Marital status: Widowed    Spouse name: Not on file   Number of children: Not on file   Years of education: Not on file   Highest education level: Not on file  Occupational History   Not on file  Tobacco Use   Smoking status: Never   Smokeless tobacco: Never  Vaping Use   Vaping status: Not on file  Substance and Sexual Activity   Alcohol use: No   Drug use: No   Sexual activity: Not on file  Other Topics Concern   Not on file  Social History Narrative   Not on file   Social Drivers of Health   Financial Resource Strain: Not on file  Food Insecurity: No Food Insecurity (03/13/2021)   Received from [facility], [facility]   Hunger Vital Sign    Worried About Running Out of Food in the Last Year: Never true    Ran Out of Food in the Last Year: Never true  Transportation Needs: Not on file  Physical Activity: Not on file  Stress: Not on file  Social Connections: Unknown (05/02/2022)   Received from [facility], [facility]   Social Network    Social Network: Not on  file  Intimate Partner Violence: Unknown (03/24/2022)   Received from [facility], [facility]   HITS    Physically Hurt: Not on file    Insult or Talk Down To: Not on file    Threaten Physical Harm: Not on file    Scream or Curse: Not on file     Review of Systems   Gen: Denies any fever, chills, fatigue, weight loss, lack of appetite.  CV: Denies chest pain, heart palpitations, peripheral edema, syncope.  Resp: Denies shortness of breath at rest or with exertion. Denies wheezing or cough.  GI: Denies dysphagia or odynophagia. Denies jaundice, hematemesis, fecal incontinence. GU : Denies urinary burning, urinary frequency, urinary hesitancy MS: Denies joint pain, muscle weakness, cramps, or limitation of movement.  Derm: Denies rash, itching,  dry skin Psych: Denies depression, anxiety, memory loss, and confusion Heme: Denies bruising, bleeding, and enlarged lymph nodes.   Physical Exam   BP (!) 155/83   Pulse 73   Temp 98 F (36.7 C)   Ht 5\' 8"  (1.727 m)   Wt (!) 354 lb 11.2 oz (160.9 kg)   BMI 53.93 kg/m  General:   Alert and oriented. Pleasant and cooperative. Well-nourished and well-developed.  Head:  Normocephalic and atraumatic. Eyes:  Without icterus Abdomen:  +BS, soft, non-tender and non-distended. No HSM noted. No guarding or rebound. No masses appreciated.  Rectal:  Deferred  Msk:  Symmetrical without gross deformities. Normal posture. Extremities:  Without edema. Neurologic:  Alert and  oriented x4;  grossly normal neurologically. Skin:  Intact without significant lesions or rashes. Psych:  Alert and cooperative. Normal mood and affect.   Assessment   Juanelle L Giles is a 53 y.o. female presenting today with a history of       PLAN   *****    Delman Ferns, PhD, ANP-BC [facility]

## 2024-05-09 NOTE — Patient Instructions (Signed)
 If diarrhea worsens or frequency, you can take dicyclomine  before meals up to 4 times a day. Monitor for constipation, dry mouth, dizziness with this.   I will see you back for hemorrhoid banding in a few weeks!  It was a pleasure to see you today. I want to create trusting relationships with patients and provide genuine, compassionate, and quality care. I truly value your feedback, so please be on the lookout for a survey regarding your visit with me today. I appreciate your time in completing this!         Delman Ferns, PhD, ANP-BC [facility]

## 2024-05-23 ENCOUNTER — Ambulatory Visit: Admitting: Gastroenterology

## 2024-05-30 ENCOUNTER — Other Ambulatory Visit: Payer: Self-pay | Admitting: Family Medicine

## 2024-05-30 DIAGNOSIS — I10 Essential (primary) hypertension: Secondary | ICD-10-CM

## 2024-06-14 ENCOUNTER — Ambulatory Visit: Admitting: Family Medicine

## 2024-06-14 ENCOUNTER — Encounter: Payer: Self-pay | Admitting: Family Medicine

## 2024-06-14 VITALS — BP 158/104 | HR 53 | Resp 16 | Ht 68.0 in | Wt 355.0 lb

## 2024-06-14 DIAGNOSIS — I10 Essential (primary) hypertension: Secondary | ICD-10-CM | POA: Diagnosis not present

## 2024-06-14 DIAGNOSIS — E876 Hypokalemia: Secondary | ICD-10-CM

## 2024-06-14 DIAGNOSIS — M10071 Idiopathic gout, right ankle and foot: Secondary | ICD-10-CM | POA: Diagnosis not present

## 2024-06-14 DIAGNOSIS — N200 Calculus of kidney: Secondary | ICD-10-CM | POA: Diagnosis not present

## 2024-06-14 DIAGNOSIS — R0602 Shortness of breath: Secondary | ICD-10-CM

## 2024-06-14 MED ORDER — TAMSULOSIN HCL 0.4 MG PO CAPS: 0.4000 mg | ORAL_CAPSULE | Freq: Every day | ORAL | 0 refills | Status: DC

## 2024-06-14 MED ORDER — ALBUTEROL SULFATE HFA 108 (90 BASE) MCG/ACT IN AERS
2.0000 | INHALATION_SPRAY | Freq: Four times a day (QID) | RESPIRATORY_TRACT | 1 refills | Status: DC | PRN
Start: 2024-06-14 — End: 2024-08-07

## 2024-06-14 MED ORDER — POTASSIUM CHLORIDE CRYS ER 10 MEQ PO TBCR: 10.0000 meq | EXTENDED_RELEASE_TABLET | Freq: Two times a day (BID) | ORAL | 1 refills | Status: DC

## 2024-06-14 MED ORDER — NIFEDIPINE ER OSMOTIC RELEASE 60 MG PO TB24: 60.0000 mg | ORAL_TABLET | Freq: Every day | ORAL | 1 refills | Status: DC

## 2024-06-14 MED ORDER — ALLOPURINOL 100 MG PO TABS: 100.0000 mg | ORAL_TABLET | Freq: Every day | ORAL | 6 refills | Status: DC

## 2024-06-14 NOTE — Patient Instructions (Addendum)
 I appreciate the opportunity to provide care to you today!    Follow up:  1 month BP  Labs: please stop by the lab today to get your blood drawn (Uric Acid)  Hypertension Management  Your current blood pressure is above the target goal of <140/90 mmHg. To help manage this, please start the following medications:  Procardia (Nifedipine) 60 mg once daily Elisa (Olmesartan) 40 mg once daily Lasix  (Furosemide ) 40 mg twice daily  To counteract the potential side effect of hypokalemia (low potassium) caused by Lasix , please also take Potassium Chloride  10 mEq twice daily.  Medication Instructions: Take your blood pressure medication at the same time each day. After taking your medication, check your blood pressure at least an hour later. If your first reading is >140/90 mmHg, wait at least 10 minutes and recheck your blood pressure. Side Effects: In the initial days of therapy, you may experience dizziness or lightheadedness as your body adjusts to the lower blood pressure; this is expected. Diet and Lifestyle: Adhere to a low-sodium diet, limiting intake to less than 1500 mg daily, and increase your physical activity. Avoid over-the-counter NSAIDs such as ibuprofen  and naproxen  while on this medication. Hydration and Nutrition: Stay well-hydrated by drinking at least 64 ounces of water daily. Increase your servings of fruits and vegetables and avoid excessive sodium in your diet. Long-Term Considerations: Uncontrolled hypertension can increase the risk of cardiovascular diseases, including stroke, coronary artery disease, and heart failure.  Please report to the emergency department if your blood pressure exceeds 180/120 and is accompanied by symptoms such as headaches, chest pain, palpitations, blurred vision, or dizziness.   Gout Prophylaxis -Start Allopurinol 100 mg daily to help prevent gout flare-ups.  I recommend decreasing your intake of high-purine foods, such as: Red meat Organ  meats (e.g., liver) Shellfish Alcohol, particularly beer Sugary beverages and foods high in fructose  Kidney Stones Start Flomax  (Tamsulosin ) 0.4 mg daily to aid in the passage of the remaining stone.  I recommend the following lifestyle modifications:  -Increase fluid intake to produce at least 2 liters of urine per day -Limit intake of salt and animal protein -Avoid excessive intake of oxalate-rich foods (e.g., spinach, nuts, chocolate) -Maintain a balanced diet with adequate calcium  from food sources  -Please follow up if your symptoms worsen or fail to improve. .   Please continue to a heart-healthy diet and increase your physical activities. Try to exercise for at least five days a week.    It was a pleasure to see you and I look forward to continuing to work together on your health and well-being. Please do not hesitate to call the office if you need care or have questions about your care.  In case of emergency, please visit the Emergency Department for urgent care, or contact our clinic at [phone] to schedule an appointment. We're here to help you!   Have a wonderful day and week. With Gratitude, Jolyssa Oplinger MSN, FNP-BC

## 2024-06-14 NOTE — Progress Notes (Signed)
 Established Patient Office Visit  Subjective:  Patient ID: Ann Giles, female    DOB: 1971/10/05  Age: 53 y.o. MRN: [id number]  CC:  Chief Complaint  Patient presents with   Follow-up    Had a gout flare in her right great toe a couple weeks ago and wants to discuss getting on some medication to prevent it    Referral    Wants to be referred to a kidney specialist for recurrent kidney stones and also concerned about her elevated blood pressure     HPI Ann Giles is a 53 y.o. female presents for the above complaints. For the details of today's visit, please refer to the assessment and plan.   Past Medical History:  Diagnosis Date   Arthritis    Asthma    Bronchitis    CHF (congestive heart failure) (HCC)    Depression    Diverticulitis    GERD (gastroesophageal reflux disease)    Hypertension    OSA (obstructive sleep apnea)     Past Surgical History:  Procedure Laterality Date   ABDOMINAL HYSTERECTOMY     BIOPSY  06/01/2022   Procedure: BIOPSY;  Surgeon: Saintclair Jasper, MD;  Location: [facility];  Service: Gastroenterology;;   CHOLECYSTECTOMY     COLONOSCOPY N/A 04/02/2024   Procedure: COLONOSCOPY;  Surgeon: Cindie Carlin POUR, DO;  Location: [facility];  Service: Endoscopy;  Laterality: N/A;  11:30 am, asa 3   COLONOSCOPY WITH PROPOFOL  N/A 06/01/2022   Procedure: COLONOSCOPY WITH PROPOFOL ;  Surgeon: Saintclair Jasper, MD;  Location: [facility];  Service: Gastroenterology;  Laterality: N/A;   ESOPHAGOGASTRODUODENOSCOPY (EGD) WITH PROPOFOL  N/A 06/01/2022   Procedure: ESOPHAGOGASTRODUODENOSCOPY (EGD) WITH PROPOFOL ;  Surgeon: Saintclair Jasper, MD;  Location: [facility];  Service: Gastroenterology;  Laterality: N/A;   GASTROPLASTY     POLYPECTOMY  06/01/2022   Procedure: POLYPECTOMY;  Surgeon: Saintclair Jasper, MD;  Location: [facility];  Service: Gastroenterology;;    Family History  Problem Relation Age of Onset   Breast cancer Maternal Aunt     Breast cancer Maternal Aunt    Asthma Neg Hx     Social History   Socioeconomic History   Marital status: Widowed    Spouse name: Not on file   Number of children: Not on file   Years of education: Not on file   Highest education level: Not on file  Occupational History   Not on file  Tobacco Use   Smoking status: Never   Smokeless tobacco: Never  Vaping Use   Vaping status: Not on file  Substance and Sexual Activity   Alcohol use: No   Drug use: No   Sexual activity: Not on file  Other Topics Concern   Not on file  Social History Narrative   Not on file   Social Drivers of Health   Financial Resource Strain: Not on file  Food Insecurity: No Food Insecurity (03/13/2021)   Received from [facility]   Hunger Vital Sign    Within the past 12 months, you worried that your food would run out before you got the money to buy more.: Never true    Within the past 12 months, the food you bought just didn't last and you didn't have money to get more.: Never true  Transportation Needs: Not on file  Physical Activity: Not on file  Stress: Not on file  Social Connections: Unknown (05/02/2022)   Received from [facility]   Social Network  Social Network: Not on file  Intimate Partner Violence: Unknown (03/24/2022)   Received from [facility]   HITS    Physically Hurt: Not on file    Insult or Talk Down To: Not on file    Threaten Physical Harm: Not on file    Scream or Curse: Not on file    Outpatient Medications Prior to Visit  Medication Sig Dispense Refill   dicyclomine  (BENTYL ) 10 MG capsule Take 1 capsule (10 mg total) by mouth 4 (four) times daily -  before meals and at bedtime. 120 capsule 2   furosemide  (LASIX ) 40 MG tablet Take 1 tablet (40 mg total) by mouth 2 (two) times daily. May take additional 20 mg for swelling Daily 60 tablet 2   omeprazole  (PRILOSEC) 40 MG capsule Take 1 capsule (40 mg total) by mouth daily. 30 capsule 2   telmisartan  (MICARDIS ) 40 MG  tablet TAKE 1 TABLET BY MOUTH DAILY. 30 tablet 1   albuterol  (PROVENTIL  HFA;VENTOLIN  HFA) 108 (90 Base) MCG/ACT inhaler Inhale 2 puffs into the lungs every 6 (six) hours as needed for wheezing or shortness of breath.     acetaminophen  (TYLENOL ) 500 MG tablet Take 1 tablet (500 mg total) by mouth every 6 (six) hours as needed. 30 tablet 0   diphenhydrAMINE (BENADRYL) 25 MG tablet Take 25 mg by mouth every 6 (six) hours as needed for allergies.     oxyCODONE -acetaminophen  (PERCOCET/ROXICET) 5-325 MG tablet Take 1 tablet by mouth every 6 (six) hours as needed for severe pain (pain score 7-10). (Patient not taking: Reported on 06/14/2024) 10 tablet 0   OXYGEN Inhale 2 L into the lungs See admin instructions. Uses as needed during the day and nightly on schedule with BIPAP     Zinc  Oxide (DESITIN) 13 % CREA Apply 1 application  topically daily as needed (Helps with diarrhea irritation).     clindamycin  (CLEOCIN ) 300 MG capsule Take 1 capsule (300 mg total) by mouth 2 (two) times daily. 14 capsule 0   phenylephrine-shark liver oil-mineral oil-petrolatum (PREPARATION H) 0.25-14-74.9 % rectal ointment Place 1 application rectally 2 (two) times daily as needed for hemorrhoids.     tamsulosin  (FLOMAX ) 0.4 MG CAPS capsule Take 1 capsule (0.4 mg total) by mouth daily. (Patient not taking: Reported on 06/14/2024) 30 capsule 0   No facility-administered medications prior to visit.    Allergies  Allergen Reactions   Amoxicillin      ROS Review of Systems  Constitutional:  Negative for chills and fever.  Eyes:  Negative for visual disturbance.  Respiratory:  Negative for chest tightness and shortness of breath.   Neurological:  Negative for dizziness and headaches.      Objective:    Physical Exam HENT:     Head: Normocephalic.     Mouth/Throat:     Mouth: Mucous membranes are moist.   Cardiovascular:     Rate and Rhythm: Normal rate.     Heart sounds: Normal heart sounds.  Pulmonary:      Effort: Pulmonary effort is normal.     Breath sounds: Normal breath sounds.   Neurological:     Mental Status: She is alert.     BP (!) 158/104   Pulse (!) 53   Resp 16   Ht 5' 8 (1.727 m)   Wt (!) 355 lb (161 kg)   SpO2 91%   BMI 53.98 kg/m  Wt Readings from Last 3 Encounters:  06/14/24 (!) 355 lb (161 kg)  05/09/24 ROLLEN)  354 lb 11.2 oz (160.9 kg)  04/17/24 (!) 365 lb (165.6 kg)    Lab Results  Component Value Date   TSH 2.218 10/31/2020   Lab Results  Component Value Date   WBC 6.5 04/18/2024   HGB 12.1 04/18/2024   HCT 38.3 04/18/2024   MCV 98.2 04/18/2024   PLT 236 04/18/2024   Lab Results  Component Value Date   NA 137 04/18/2024   K 3.1 (L) 04/18/2024   CO2 30 04/18/2024   GLUCOSE 151 (H) 04/18/2024   BUN 11 04/18/2024   CREATININE 1.27 (H) 04/18/2024   BILITOT 0.6 04/18/2024   ALKPHOS 58 04/18/2024   AST 17 04/18/2024   ALT 18 04/18/2024   PROT 7.3 04/18/2024   ALBUMIN  3.5 04/18/2024   CALCIUM  8.9 04/18/2024   ANIONGAP 7 04/18/2024   EGFR 72 09/18/2022   Lab Results  Component Value Date   CHOL 151 06/24/2019   Lab Results  Component Value Date   HDL 64 06/24/2019   Lab Results  Component Value Date   LDLCALC 78 06/24/2019   Lab Results  Component Value Date   TRIG 43 06/24/2019   Lab Results  Component Value Date   CHOLHDL 2.4 06/24/2019   No results found for: HGBA1C    Assessment & Plan:  Primary hypertension Assessment & Plan: Uncontrolled blood pressure today in the clinic The patient is currently asymptomatic. To improve blood pressure control, Procardia  (Nifedipine ) 60 mg daily will be added to her treatment regimen. She is encouraged to continue taking Simvastatin 40 mg daily and Lasix  (Furosemide ) 40 mg daily as previously prescribed.  To counteract the risk of hypokalemia associated with Lasix  use, Potassium Chloride  10 mEq twice daily will be initiated.  The patient is advised to follow a low-sodium diet and  increase physical activity as tolerated. She is also encouraged to monitor her blood pressure at home and bring her readings to the next visit for review.    Orders: -     NIFEdipine  ER Osmotic Release; Take 1 tablet (60 mg total) by mouth daily.  Dispense: 30 tablet; Refill: 1  Acute idiopathic gout involving toe of right foot Assessment & Plan: Will initiate therapy with Allopurinol  100 mg daily for prophylactic treatment of gout. Discussed the importance of dietary modifications, including decreasing intake of high-purine foods such as red meat, organ meats (e.g., liver), shellfish, sugary beverages, and foods high in fructose to help prevent future gout flare-ups.   Orders: -     Uric acid -     Allopurinol ; Take 1 tablet (100 mg total) by mouth daily.  Dispense: 30 tablet; Refill: 6  Kidney stones Assessment & Plan: The patient reports a history of inadequate fluid intake due to congestive heart failure. She states that she has had approximately seven kidney stones in her lifetime. She reports passing one stone but believes a second stone remains, as she has not passed it yet. She is currently experiencing pain rated 4/10, but denies any blood in her urine or stools.  Refill Flomax  (Tamsulosin ) 0.4 mg daily to facilitate the passage of the remaining stone. If she experiences multiple recurrent stones or complications within the year, a referral to urology will be considered for further evaluation and management.   Orders: -     Tamsulosin  HCl; Take 1 capsule (0.4 mg total) by mouth daily.  Dispense: 30 capsule; Refill: 0  Hypokalemia -     Potassium Chloride  Crys ER; Take 1 tablet (10 mEq  total) by mouth 2 (two) times daily.  Dispense: 90 tablet; Refill: 1  SOB (shortness of breath) -     Albuterol  Sulfate HFA; Inhale 2 puffs into the lungs every 6 (six) hours as needed for wheezing or shortness of breath.  Dispense: 18 g; Refill: 1  Note: This chart has been completed using  Engineer, civil (consulting) software, and while attempts have been made to ensure accuracy, certain words and phrases may not be transcribed as intended.    Follow-up: Return in about 1 month (around 07/14/2024) for BP.   Mileena Rothenberger, FNP

## 2024-06-15 ENCOUNTER — Other Ambulatory Visit: Payer: Self-pay | Admitting: Family Medicine

## 2024-06-15 DIAGNOSIS — N200 Calculus of kidney: Secondary | ICD-10-CM | POA: Insufficient documentation

## 2024-06-15 NOTE — Assessment & Plan Note (Signed)
 Uncontrolled blood pressure today in the clinic The patient is currently asymptomatic. To improve blood pressure control, Procardia  (Nifedipine ) 60 mg daily will be added to her treatment regimen. She is encouraged to continue taking Simvastatin 40 mg daily and Lasix  (Furosemide ) 40 mg daily as previously prescribed.  To counteract the risk of hypokalemia associated with Lasix  use, Potassium Chloride  10 mEq twice daily will be initiated.  The patient is advised to follow a low-sodium diet and increase physical activity as tolerated. She is also encouraged to monitor her blood pressure at home and bring her readings to the next visit for review.

## 2024-06-15 NOTE — Assessment & Plan Note (Signed)
 Will initiate therapy with Allopurinol  100 mg daily for prophylactic treatment of gout. Discussed the importance of dietary modifications, including decreasing intake of high-purine foods such as red meat, organ meats (e.g., liver), shellfish, sugary beverages, and foods high in fructose to help prevent future gout flare-ups.

## 2024-06-15 NOTE — Assessment & Plan Note (Signed)
 The patient reports a history of inadequate fluid intake due to congestive heart failure. She states that she has had approximately seven kidney stones in her lifetime. She reports passing one stone but believes a second stone remains, as she has not passed it yet. She is currently experiencing pain rated 4/10, but denies any blood in her urine or stools.  Refill Flomax  (Tamsulosin ) 0.4 mg daily to facilitate the passage of the remaining stone. If she experiences multiple recurrent stones or complications within the year, a referral to urology will be considered for further evaluation and management.

## 2024-06-29 ENCOUNTER — Ambulatory Visit

## 2024-07-13 ENCOUNTER — Ambulatory Visit

## 2024-07-15 ENCOUNTER — Other Ambulatory Visit: Payer: Self-pay | Admitting: Family Medicine

## 2024-07-15 DIAGNOSIS — N200 Calculus of kidney: Secondary | ICD-10-CM

## 2024-08-06 ENCOUNTER — Other Ambulatory Visit: Payer: Self-pay | Admitting: Family Medicine

## 2024-08-06 DIAGNOSIS — R0602 Shortness of breath: Secondary | ICD-10-CM

## 2024-08-08 ENCOUNTER — Other Ambulatory Visit: Payer: Self-pay | Admitting: Family Medicine

## 2024-08-08 DIAGNOSIS — I10 Essential (primary) hypertension: Secondary | ICD-10-CM

## 2024-08-19 ENCOUNTER — Other Ambulatory Visit: Payer: Self-pay | Admitting: Family Medicine

## 2024-08-19 DIAGNOSIS — I10 Essential (primary) hypertension: Secondary | ICD-10-CM

## 2024-08-19 DIAGNOSIS — N200 Calculus of kidney: Secondary | ICD-10-CM

## 2024-08-27 ENCOUNTER — Encounter: Payer: Self-pay | Admitting: Family Medicine

## 2024-08-27 ENCOUNTER — Ambulatory Visit: Admitting: Family Medicine

## 2024-08-27 VITALS — BP 148/88 | HR 60 | Ht 68.0 in | Wt 357.0 lb

## 2024-08-27 DIAGNOSIS — E038 Other specified hypothyroidism: Secondary | ICD-10-CM

## 2024-08-27 DIAGNOSIS — R7301 Impaired fasting glucose: Secondary | ICD-10-CM | POA: Diagnosis not present

## 2024-08-27 DIAGNOSIS — I10 Essential (primary) hypertension: Secondary | ICD-10-CM

## 2024-08-27 DIAGNOSIS — R413 Other amnesia: Secondary | ICD-10-CM

## 2024-08-27 DIAGNOSIS — E7849 Other hyperlipidemia: Secondary | ICD-10-CM

## 2024-08-27 DIAGNOSIS — E559 Vitamin D deficiency, unspecified: Secondary | ICD-10-CM

## 2024-08-27 DIAGNOSIS — N200 Calculus of kidney: Secondary | ICD-10-CM | POA: Diagnosis not present

## 2024-08-27 MED ORDER — HYDRALAZINE HCL 10 MG PO TABS: 10.0000 mg | ORAL_TABLET | Freq: Every day | ORAL | 1 refills | Status: DC

## 2024-08-27 MED ORDER — HYDRALAZINE HCL 10 MG PO TABS: 10.0000 mg | ORAL_TABLET | Freq: Two times a day (BID) | ORAL | 1 refills | Status: DC

## 2024-08-27 NOTE — Assessment & Plan Note (Signed)
 The patient reports recent overwork and stress, along with increasing forgetfulness and concern about short-term memory issues. An MMSE performed in the office scored 30, which is within the normal range. Her symptoms are likely related to stress-induced forgetfulness. Nonpharmacological interventions were discussed, including stress management techniques, establishing regular rest and sleep routines, engaging in relaxation practices such as mindfulness or meditation, and incorporating regular physical activity.

## 2024-08-27 NOTE — Patient Instructions (Addendum)
 I appreciate the opportunity to provide care to you today!    Follow up:  1 months bp nurse visit / 4 months follow up  Labs: please stop by the lab during the week to get your blood drawn (CBC, CMP, TSH, Lipid profile, HgA1c, Vit D)  Hypertension Management  Your current blood pressure is above the target goal of <140/90 mmHg. To address this, please start taking hydralazine  10 mg daily along with your other antihypertensive.   Medication Instructions: Take your blood pressure medication at the same time each day. After taking your medication, check your blood pressure at least an hour later. If your first reading is >140/90 mmHg, wait at least 10 minutes and recheck your blood pressure. Side Effects: In the initial days of therapy, you may experience dizziness or lightheadedness as your body adjusts to the lower blood pressure; this is expected. Diet and Lifestyle: Adhere to a low-sodium diet, limiting intake to less than 1500 mg daily, and increase your physical activity. Avoid over-the-counter NSAIDs such as ibuprofen  and naproxen  while on this medication. Hydration and Nutrition: Stay well-hydrated by drinking at least 64 ounces of water daily. Increase your servings of fruits and vegetables and avoid excessive sodium in your diet. Long-Term Considerations: Uncontrolled hypertension can increase the risk of cardiovascular diseases, including stroke, coronary artery disease, and heart failure.  Please report to the emergency department if your blood pressure exceeds 180/120 and is accompanied by symptoms such as headaches, chest pain, palpitations, blurred vision, or dizziness.    Please follow up if your symptoms worsen or fail to improve.    Please continue to a heart-healthy diet and increase your physical activities. Try to exercise for at least five days a week.    It was a pleasure to see you and I look forward to continuing to work together on your health and  well-being. Please do not hesitate to call the office if you need care or have questions about your care.  In case of emergency, please visit the Emergency Department for urgent care, or contact our clinic at [phone] to schedule an appointment. We're here to help you!   Have a wonderful day and week. With Gratitude, Tearra Ouk MSN, FNP-BC

## 2024-08-27 NOTE — Assessment & Plan Note (Signed)
 The patient's blood pressure is uncontrolled in the clinic today; however, she remains asymptomatic. To improve blood pressure control, hydralazine  10 mg daily will be added to her regimen in conjunction with verapamil 60 mg daily, Lasix  40 mg twice daily, and telmisartan  40 mg daily. She was advised to follow a low-sodium diet, increase physical activity as tolerated, and monitor her blood pressure at home, bringing her readings to the next visit for review. BP Readings from Last 3 Encounters:  08/27/24 (!) 148/88  06/14/24 (!) 158/104  05/09/24 (!) 155/83

## 2024-08-27 NOTE — Progress Notes (Signed)
 Established Patient Office Visit  Subjective:  Patient ID: Ann Giles, female    DOB: [birth year]  Age: 53 y.o. MRN: [id number]  CC:  Chief Complaint  Patient presents with   Hypertension   Memory Loss    Concerned about short term memory issues, can't find the right words when speaking; increasing issues over past month, also c/o blurry vision   Flank Pain    Left side, hx of kidney stones     HPI Ann Giles is a 53 y.o. female with past medical history of hypertension, kidney stones, obesity presents for f/u of  chronic medical conditions.  For the details of today's visit, please refer to the assessment and plan.     Past Medical History:  Diagnosis Date   Arthritis    Asthma    Bronchitis    CHF (congestive heart failure) (HCC)    Depression    Diverticulitis    GERD (gastroesophageal reflux disease)    Hypertension    OSA (obstructive sleep apnea)     Past Surgical History:  Procedure Laterality Date   ABDOMINAL HYSTERECTOMY     BIOPSY  06/01/2022   Procedure: BIOPSY;  Surgeon: Ann Jasper, MD;  Location: [facility];  Service: Gastroenterology;;   CHOLECYSTECTOMY     COLONOSCOPY N/A 04/02/2024   Procedure: COLONOSCOPY;  Surgeon: Ann Carlin POUR, DO;  Location: [facility];  Service: Endoscopy;  Laterality: N/A;  11:30 am, asa 3   COLONOSCOPY WITH PROPOFOL  N/A 06/01/2022   Procedure: COLONOSCOPY WITH PROPOFOL ;  Surgeon: Ann Jasper, MD;  Location: [facility];  Service: Gastroenterology;  Laterality: N/A;   ESOPHAGOGASTRODUODENOSCOPY (EGD) WITH PROPOFOL  N/A 06/01/2022   Procedure: ESOPHAGOGASTRODUODENOSCOPY (EGD) WITH PROPOFOL ;  Surgeon: Ann Jasper, MD;  Location: [facility];  Service: Gastroenterology;  Laterality: N/A;   GASTROPLASTY     POLYPECTOMY  06/01/2022   Procedure: POLYPECTOMY;  Surgeon: Ann Jasper, MD;  Location: [facility];  Service: Gastroenterology;;    Family History  Problem Relation Age of Onset    Breast cancer Maternal Aunt    Breast cancer Maternal Aunt    Asthma Neg Hx     Social History   Socioeconomic History   Marital status: Widowed    Spouse name: Not on file   Number of children: Not on file   Years of education: Not on file   Highest education level: Not on file  Occupational History   Not on file  Tobacco Use   Smoking status: Never   Smokeless tobacco: Never  Vaping Use   Vaping status: Not on file  Substance and Sexual Activity   Alcohol use: No   Drug use: No   Sexual activity: Not on file  Other Topics Concern   Not on file  Social History Narrative   Not on file   Social Drivers of Health   Financial Resource Strain: Not on file  Food Insecurity: No Food Insecurity (03/13/2021)   [facility]   Hunger Vital Sign    Within the past 12 months, you worried that your food would run out before you got the money to buy more.: Never true    Within the past 12 months, the food you bought just didn't last and you didn't have money to get more.: Never true  Transportation Needs: Not on file  Physical Activity: Not on file  Stress: Not on file  Social Connections: Unknown (05/02/2022)   Received from [facility]   Social Network  Social Network: Not on file  Intimate Partner Violence: Unknown (03/24/2022)   Received from [facility]   HITS    Physically Hurt: Not on file    Insult or Talk Down To: Not on file    Threaten Physical Harm: Not on file    Scream or Curse: Not on file    Outpatient Medications Prior to Visit  Medication Sig Dispense Refill   acetaminophen  (TYLENOL ) 500 MG tablet Take 1 tablet (500 mg total) by mouth every 6 (six) hours as needed. 30 tablet 0   albuterol  (VENTOLIN  HFA) 108 (90 Base) MCG/ACT inhaler INHALE 2 PUFFS INTO THE LUNGS EVERY 6 HOURS AS NEEDED FOR WHEEZING OR SHORTNESS OF BREATH 18 g 1   allopurinol  (ZYLOPRIM ) 100 MG tablet Take 1 tablet (100 mg total) by mouth daily. 30 tablet 6   dicyclomine   (BENTYL ) 10 MG capsule Take 1 capsule (10 mg total) by mouth 4 (four) times daily -  before meals and at bedtime. 120 capsule 2   diphenhydrAMINE (BENADRYL) 25 MG tablet Take 25 mg by mouth every 6 (six) hours as needed for allergies.     furosemide  (LASIX ) 40 MG tablet Take 1 tablet (40 mg total) by mouth 2 (two) times daily. May take additional 20 mg for swelling Daily 60 tablet 2   NIFEdipine  (PROCARDIA  XL/NIFEDICAL XL) 60 MG 24 hr tablet TAKE 1 TABLET(60 MG) BY MOUTH DAILY 30 tablet 1   omeprazole  (PRILOSEC) 40 MG capsule Take 1 capsule (40 mg total) by mouth daily. 30 capsule 2   OXYGEN Inhale 2 L into the lungs See admin instructions. Uses as needed during the day and nightly on schedule with BIPAP     potassium chloride  (KLOR-CON  M) 10 MEQ tablet Take 1 tablet (10 mEq total) by mouth 2 (two) times daily. 90 tablet 1   tamsulosin  (FLOMAX ) 0.4 MG CAPS capsule TAKE 1 CAPSULE(0.4 MG) BY MOUTH DAILY 30 capsule 0   telmisartan  (MICARDIS ) 40 MG tablet TAKE 1 TABLET BY MOUTH DAILY 30 tablet 1   Zinc  Oxide (DESITIN) 13 % CREA Apply 1 application  topically daily as needed (Helps with diarrhea irritation).     oxyCODONE -acetaminophen  (PERCOCET/ROXICET) 5-325 MG tablet Take 1 tablet by mouth every 6 (six) hours as needed for severe pain (pain score 7-10). (Patient not taking: Reported on 08/27/2024) 10 tablet 0   No facility-administered medications prior to visit.    Allergies  Allergen Reactions   Amoxicillin      ROS Review of Systems  Constitutional:  Negative for chills and fever.  Eyes:  Negative for visual disturbance.  Respiratory:  Negative for chest tightness and shortness of breath.   Genitourinary:  Positive for flank pain (left side).  Neurological:  Negative for dizziness and headaches.      Objective:    Physical Exam HENT:     Head: Normocephalic.     Mouth/Throat:     Mouth: Mucous membranes are moist.  Cardiovascular:     Rate and Rhythm: Normal rate.     Heart  sounds: Normal heart sounds.  Pulmonary:     Effort: Pulmonary effort is normal.     Breath sounds: Normal breath sounds.  Neurological:     Mental Status: She is alert.     BP (!) 148/88   Pulse 60   Ht 5' 8 (1.727 m)   Wt (!) 357 lb (161.9 kg)   SpO2 95%   BMI 54.28 kg/m  Wt Readings from Last 3 Encounters:  08/27/24 (!) 357 lb (161.9 kg)  06/14/24 (!) 355 lb (161 kg)  05/09/24 (!) 354 lb 11.2 oz (160.9 kg)    Lab Results  Component Value Date   TSH 2.218 10/31/2020   Lab Results  Component Value Date   WBC 6.5 04/18/2024   HGB 12.1 04/18/2024   HCT 38.3 04/18/2024   MCV 98.2 04/18/2024   PLT 236 04/18/2024   Lab Results  Component Value Date   NA 137 04/18/2024   K 3.1 (L) 04/18/2024   CO2 30 04/18/2024   GLUCOSE 151 (H) 04/18/2024   BUN 11 04/18/2024   CREATININE 1.27 (H) 04/18/2024   BILITOT 0.6 04/18/2024   ALKPHOS 58 04/18/2024   AST 17 04/18/2024   ALT 18 04/18/2024   PROT 7.3 04/18/2024   ALBUMIN  3.5 04/18/2024   CALCIUM  8.9 04/18/2024   ANIONGAP 7 04/18/2024   EGFR 72 09/18/2022   Lab Results  Component Value Date   CHOL 151 06/24/2019   Lab Results  Component Value Date   HDL 64 06/24/2019   Lab Results  Component Value Date   LDLCALC 78 06/24/2019   Lab Results  Component Value Date   TRIG 43 06/24/2019   Lab Results  Component Value Date   CHOLHDL 2.4 06/24/2019   No results found for: HGBA1C    Assessment & Plan:  Primary hypertension Assessment & Plan: The patient's blood pressure is uncontrolled in the clinic today; however, she remains asymptomatic. To improve blood pressure control, hydralazine  10 mg daily will be added to her regimen in conjunction with verapamil 60 mg daily, Lasix  40 mg twice daily, and telmisartan  40 mg daily. She was advised to follow a low-sodium diet, increase physical activity as tolerated, and monitor her blood pressure at home, bringing her readings to the next visit for review. BP Readings  from Last 3 Encounters:  08/27/24 (!) 148/88  06/14/24 (!) 158/104  05/09/24 (!) 155/83       Orders: -     hydrALAZINE  HCl; Take 1 tablet (10 mg total) by mouth daily.  Dispense: 60 tablet; Refill: 1  Memory changes Assessment & Plan: The patient reports recent overwork and stress, along with increasing forgetfulness and concern about short-term memory issues. An MMSE performed in the office scored 30, which is within the normal range. Her symptoms are likely related to stress-induced forgetfulness. Nonpharmacological interventions were discussed, including stress management techniques, establishing regular rest and sleep routines, engaging in relaxation practices such as mindfulness or meditation, and incorporating regular physical activity.    Kidney stones Assessment & Plan: The patient reports having passed one of her kidney stones, which was the larger stone, in June or July, and now reports passage of a second smaller stone that is causing increased discomfort on the left side. She rates her pain at 6/10 and states she has an adequate supply of Flomax  to facilitate stone passage. She was encouraged to continue her current treatment regimen, maintain increased fluid intake as recommended by cardiology, and use over-the-counter analgesics as needed for pain relief.    IFG (impaired fasting glucose) -     Hemoglobin A1c  Vitamin D deficiency -     VITAMIN D 25 Hydroxy (Vit-D Deficiency, Fractures)  TSH (thyroid-stimulating hormone deficiency) -     TSH + free T4  Other hyperlipidemia -     Lipid panel -     CMP14+EGFR -     CBC with Differential/Platelet  Note: This chart has been completed  using [employer], and while attempts have been made to ensure accuracy, certain words and phrases may not be transcribed as intended.    Follow-up: Return in about 1 month (around 09/26/2024).   Raphael Fitzpatrick, FNP

## 2024-08-27 NOTE — Assessment & Plan Note (Signed)
 The patient reports having passed one of her kidney stones, which was the larger stone, in June or July, and now reports passage of a second smaller stone that is causing increased discomfort on the left side. She rates her pain at 6/10 and states she has an adequate supply of Flomax  to facilitate stone passage. She was encouraged to continue her current treatment regimen, maintain increased fluid intake as recommended by cardiology, and use over-the-counter analgesics as needed for pain relief.

## 2024-09-17 ENCOUNTER — Other Ambulatory Visit: Payer: Self-pay | Admitting: Family Medicine

## 2024-09-17 ENCOUNTER — Ambulatory Visit: Payer: Self-pay | Admitting: Family Medicine

## 2024-09-17 DIAGNOSIS — N200 Calculus of kidney: Secondary | ICD-10-CM

## 2024-10-02 ENCOUNTER — Other Ambulatory Visit: Payer: Self-pay | Admitting: Family Medicine

## 2024-10-02 DIAGNOSIS — E876 Hypokalemia: Secondary | ICD-10-CM

## 2024-10-28 ENCOUNTER — Other Ambulatory Visit: Payer: Self-pay | Admitting: Family Medicine

## 2024-10-28 DIAGNOSIS — N200 Calculus of kidney: Secondary | ICD-10-CM

## 2024-10-28 DIAGNOSIS — I10 Essential (primary) hypertension: Secondary | ICD-10-CM

## 2025-01-10 ENCOUNTER — Other Ambulatory Visit: Payer: Self-pay | Admitting: Family Medicine

## 2025-01-10 DIAGNOSIS — I10 Essential (primary) hypertension: Secondary | ICD-10-CM

## 2025-01-17 ENCOUNTER — Other Ambulatory Visit: Payer: Self-pay | Admitting: Family Medicine

## 2025-01-17 DIAGNOSIS — M10071 Idiopathic gout, right ankle and foot: Secondary | ICD-10-CM

## 2025-01-18 ENCOUNTER — Telehealth: Payer: Self-pay | Admitting: Family Medicine

## 2025-01-18 ENCOUNTER — Telehealth: Payer: Self-pay

## 2025-01-18 DIAGNOSIS — M10071 Idiopathic gout, right ankle and foot: Secondary | ICD-10-CM

## 2025-01-18 NOTE — Telephone Encounter (Signed)
 Copied from CRM [id number]. Topic: Clinical - Medication Refill >> Jan 18, 2025  8:16 AM Mercedes MATSU wrote: Medication:  allopurinol  (ZYLOPRIM ) 100 MG tablet Asking for a 90 day refill instead of a 30 day.  Has the patient contacted their pharmacy? Yes (Agent: If no, request that the patient contact the pharmacy for the refill. If patient does not wish to contact the pharmacy document the reason why and proceed with request.) (Agent: If yes, when and what did the pharmacy advise?)  This is the patient's preferred pharmacy:  [facility] [phone] - [place], [place] - [address] AT [facility] [address] [place] [address] Phone: [phone] Fax: [phone]  Is this the correct pharmacy for this prescription? Yes If no, delete pharmacy and type the correct one.   Has the prescription been filled recently? Yes  Is the patient out of the medication? Yes  Has the patient been seen for an appointment in the last year OR does the patient have an upcoming appointment? Yes  Can we respond through MyChart? Yes  Agent: Please be advised that Rx refills may take up to 3 business days. We ask that you follow-up with your pharmacy.

## 2025-01-18 NOTE — Telephone Encounter (Signed)
 done

## 2025-01-18 NOTE — Telephone Encounter (Signed)
 Prescription requested for 90 day supply--- was sent yesterday- no meds pended-   FYI

## 2025-01-18 NOTE — Telephone Encounter (Signed)
 Copied from CRM [id number]. Topic: Clinical - Medication Question >> Jan 18, 2025  8:18 AM Mercedes MATSU wrote: Reason for CRM: Patient states she stopped taking her telmisartan  (MICARDIS ) 40 MG tablet due to it making her kidneys hurt. She wants to inform a nurse and is requesting a call back at [phone].

## 2025-01-19 ENCOUNTER — Other Ambulatory Visit: Payer: Self-pay | Admitting: Family Medicine

## 2025-01-19 DIAGNOSIS — R10A0 Flank pain, unspecified side: Secondary | ICD-10-CM

## 2025-01-19 DIAGNOSIS — I10 Essential (primary) hypertension: Secondary | ICD-10-CM

## 2025-01-19 NOTE — Telephone Encounter (Signed)
 Telmisartan  does not typically cause kidney pain. Orders have been placed to check her kidney function and potassium levels to ensure everything is stable. We also want to rule out a urinary tract infection (UTI) as a possible cause of your symptoms.

## 2025-01-22 NOTE — Telephone Encounter (Signed)
 Left detailed vm
